# Patient Record
Sex: Female | Born: 1990 | Race: Black or African American | Hispanic: No | Marital: Single | Smoking: Current some day smoker
Health system: Southern US, Community
[De-identification: ages and names within clinical notes are randomized; demographics above are authoritative.]

## PROBLEM LIST (undated history)

## (undated) ENCOUNTER — Inpatient Hospital Stay (HOSPITAL_COMMUNITY): Payer: Self-pay

## (undated) DIAGNOSIS — J45909 Unspecified asthma, uncomplicated: Secondary | ICD-10-CM

## (undated) DIAGNOSIS — F329 Major depressive disorder, single episode, unspecified: Secondary | ICD-10-CM

## (undated) DIAGNOSIS — R519 Headache, unspecified: Secondary | ICD-10-CM

## (undated) DIAGNOSIS — F419 Anxiety disorder, unspecified: Secondary | ICD-10-CM

## (undated) DIAGNOSIS — O24419 Gestational diabetes mellitus in pregnancy, unspecified control: Secondary | ICD-10-CM

## (undated) DIAGNOSIS — F32A Depression, unspecified: Secondary | ICD-10-CM

## (undated) HISTORY — DX: Depression, unspecified: F32.A

## (undated) HISTORY — DX: Major depressive disorder, single episode, unspecified: F32.9

## (undated) HISTORY — DX: Anxiety disorder, unspecified: F41.9

## (undated) HISTORY — DX: Headache, unspecified: R51.9

---
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
## 2014-05-23 ENCOUNTER — Ambulatory Visit (INDEPENDENT_AMBULATORY_CARE_PROVIDER_SITE_OTHER): Payer: BC Managed Care – PPO | Admitting: Family Medicine

## 2014-05-23 VITALS — BP 137/77 | HR 81 | Temp 99.0°F | Resp 20 | Ht 66.75 in | Wt 219.2 lb

## 2014-05-23 DIAGNOSIS — J209 Acute bronchitis, unspecified: Secondary | ICD-10-CM

## 2014-05-23 DIAGNOSIS — J029 Acute pharyngitis, unspecified: Secondary | ICD-10-CM

## 2014-05-23 DIAGNOSIS — R05 Cough: Secondary | ICD-10-CM

## 2014-05-23 DIAGNOSIS — R059 Cough, unspecified: Secondary | ICD-10-CM

## 2014-05-23 MED ORDER — AZITHROMYCIN 250 MG PO TABS: ORAL_TABLET | ORAL | Status: DC

## 2014-05-23 MED ORDER — ALBUTEROL SULFATE HFA 108 (90 BASE) MCG/ACT IN AERS: 2.0000 | INHALATION_SPRAY | Freq: Four times a day (QID) | RESPIRATORY_TRACT | Status: DC | PRN

## 2014-05-23 MED ORDER — HYDROCODONE-HOMATROPINE 5-1.5 MG/5ML PO SYRP: 5.0000 mL | ORAL_SOLUTION | Freq: Three times a day (TID) | ORAL | Status: DC | PRN

## 2014-05-23 NOTE — Progress Notes (Signed)
° °  Subjective:    Patient ID: Julia Santiago, female    DOB: 03/19/1991, 23 y.o.   MRN: [id number]  HPI Chief Complaint  Patient presents with   Cough    brownish sputum, body aches x 3 days   This chart was scribed for Elvina SidleKurt Lauenstein, MD by Andrew Auaven Small, ED Scribe. This patient was seen in room 8 and the patient's care was started at 3:20 PM.  HPI Comments: Julia Santiago is a 23 y.o. female who presents to the [facility] complaining of productive cough onset 3 days ago. Pt reports HA's, congestion, postnasal drip, and body aches for the past 3 days. Pt reports she symptoms wake her up during the night due to choking on the mucous in her throat and constantly coughing. Pt reports hx of asthma when she was younger but denies having an inhale. Pt is allergic to penicillin.   Pt works at [employer].   Past Medical History  Diagnosis Date   Arthritis    Depression    Allergies  Allergen Reactions   Penicillins     As a child   Prior to Admission medications   Not on File   Review of Systems  HENT: Positive for congestion and postnasal drip.   Respiratory: Positive for cough.    Objective:   Physical Exam  Constitutional: She is oriented to person, place, and time. She appears well-developed and well-nourished. No distress.  HENT:  Head: Normocephalic and atraumatic.  Right Ear: External ear normal.  Left Ear: External ear normal.  Mouth/Throat: Posterior oropharyngeal erythema present. No oropharyngeal exudate or posterior oropharyngeal edema.  Eyes: Conjunctivae and EOM are normal.  Neck: Neck supple.  Slight fullness over the thyroid  Cardiovascular: Normal rate.   Pulmonary/Chest: Effort normal. She has wheezes ( expiratory).  Musculoskeletal: Normal range of motion.  Neurological: She is alert and oriented to person, place, and time.  Skin: Skin is warm and dry.  Psychiatric: She has a normal mood and affect. Her behavior is normal.    Nursing note and vitals reviewed.  Assessment & Plan:    1. Cough   2. Acute bronchitis, unspecified organism   3. Acute pharyngitis, unspecified pharyngitis type    Meds ordered this encounter  Medications   HYDROcodone-homatropine (HYCODAN) 5-1.5 MG/5ML syrup    Sig: Take 5 mLs by mouth every 8 (eight) hours as needed for cough.    Dispense:  120 mL    Refill:  0   azithromycin (ZITHROMAX Z-PAK) 250 MG tablet    Sig: Take as directed on pack    Dispense:  6 tablet    Refill:  0   albuterol (PROVENTIL HFA;VENTOLIN HFA) 108 (90 BASE) MCG/ACT inhaler    Sig: Inhale 2 puffs into the lungs every 6 (six) hours as needed for wheezing or shortness of breath.    Dispense:  1 Inhaler    Refill:  0   Elvina SidleKurt Lauenstein, MD

## 2014-05-23 NOTE — Patient Instructions (Signed)

## 2014-06-10 ENCOUNTER — Inpatient Hospital Stay (HOSPITAL_COMMUNITY)
Admission: AD | Admit: 2014-06-10 | Discharge: 2014-06-10 | Disposition: A | Discharge status: Home / Self Care | Payer: BC Managed Care – PPO | Source: Ambulatory Visit | Attending: Obstetrics | Admitting: Obstetrics

## 2014-06-10 ENCOUNTER — Inpatient Hospital Stay (HOSPITAL_COMMUNITY): Payer: BC Managed Care – PPO

## 2014-06-10 ENCOUNTER — Encounter (HOSPITAL_COMMUNITY): Payer: Self-pay | Admitting: *Deleted

## 2014-06-10 DIAGNOSIS — R109 Unspecified abdominal pain: Secondary | ICD-10-CM | POA: Diagnosis present

## 2014-06-10 DIAGNOSIS — O26899 Other specified pregnancy related conditions, unspecified trimester: Secondary | ICD-10-CM

## 2014-06-10 DIAGNOSIS — R51 Headache: Secondary | ICD-10-CM | POA: Diagnosis not present

## 2014-06-10 DIAGNOSIS — Z3A08 8 weeks gestation of pregnancy: Secondary | ICD-10-CM | POA: Insufficient documentation

## 2014-06-10 DIAGNOSIS — Z87891 Personal history of nicotine dependence: Secondary | ICD-10-CM | POA: Insufficient documentation

## 2014-06-10 DIAGNOSIS — O9989 Other specified diseases and conditions complicating pregnancy, childbirth and the puerperium: Secondary | ICD-10-CM | POA: Insufficient documentation

## 2014-06-10 DIAGNOSIS — O26891 Other specified pregnancy related conditions, first trimester: Secondary | ICD-10-CM

## 2014-06-10 DIAGNOSIS — R12 Heartburn: Secondary | ICD-10-CM | POA: Diagnosis not present

## 2014-06-10 HISTORY — DX: Unspecified asthma, uncomplicated: J45.909

## 2014-06-10 LAB — URINALYSIS, ROUTINE W REFLEX MICROSCOPIC
Bilirubin Urine: NEGATIVE
Glucose, UA: NEGATIVE mg/dL
HGB URINE DIPSTICK: NEGATIVE
Ketones, ur: NEGATIVE mg/dL
Leukocytes, UA: NEGATIVE
Nitrite: NEGATIVE
Protein, ur: NEGATIVE mg/dL
Specific Gravity, Urine: 1.02 (ref 1.005–1.030)
UROBILINOGEN UA: 0.2 mg/dL (ref 0.0–1.0)
pH: 8.5 — ABNORMAL HIGH (ref 5.0–8.0)

## 2014-06-10 LAB — CBC
HCT: 41 % (ref 36.0–46.0)
Hemoglobin: 13.7 g/dL (ref 12.0–15.0)
MCH: 30 pg (ref 26.0–34.0)
MCHC: 33.4 g/dL (ref 30.0–36.0)
MCV: 89.7 fL (ref 78.0–100.0)
PLATELETS: 408 10*3/uL — AB (ref 150–400)
RBC: 4.57 MIL/uL (ref 3.87–5.11)
RDW: 12.5 % (ref 11.5–15.5)
WBC: 11.8 10*3/uL — ABNORMAL HIGH (ref 4.0–10.5)

## 2014-06-10 LAB — AMYLASE: Amylase: 41 U/L (ref 0–105)

## 2014-06-10 LAB — LIPASE, BLOOD: Lipase: 20 U/L (ref 11–59)

## 2014-06-10 LAB — HCG, QUANTITATIVE, PREGNANCY: hCG, Beta Chain, Quant, S: 61804 m[IU]/mL — ABNORMAL HIGH (ref <5)

## 2014-06-10 MED ORDER — PROMETHAZINE HCL 25 MG PO TABS
25.0000 mg | ORAL_TABLET | Freq: Once | ORAL | Status: AC
  Administered 2014-06-10: 25 mg via ORAL
  Filled 2014-06-10: qty 1

## 2014-06-10 MED ORDER — RANITIDINE HCL 150 MG PO TABS: 150.0000 mg | ORAL_TABLET | Freq: Two times a day (BID) | ORAL | Status: DC

## 2014-06-10 MED ORDER — PROMETHAZINE HCL 25 MG PO TABS: 12.5000 mg | ORAL_TABLET | Freq: Four times a day (QID) | ORAL | Status: DC | PRN

## 2014-06-10 MED ORDER — GI COCKTAIL ~~LOC~~
30.0000 mL | Freq: Once | ORAL | Status: AC
  Administered 2014-06-10: 30 mL via ORAL
  Filled 2014-06-10: qty 30

## 2014-06-10 NOTE — MAU Note (Signed)
Been having stomach pains since yesterday; mostly upper abd, sometimes in lower abd.  Been having, nausea, and diarrhea. No one else at home is sick or has diarrhea. No fever

## 2014-06-10 NOTE — MAU Provider Note (Signed)
Chief Complaint: Abdominal Pain; Nausea; and Diarrhea   None    SUBJECTIVE HPI: Julia Santiago is a 23 y.o. G1P0 at 8154w1d by LMP who presents to maternity admissions reporting abdominal pain, h/a, nausea, and diarrhea x 2 days.  She reports her abdominal pain is mostly upper abdomen but she does have lower abdominal cramping as well.  She denies recent sick contacts.  She was seen 2 weeks ago in Dr Elsie StainMarshall's office and had U/S showing "only the sac and no baby".   She also had pelvic exam with cultures and prenatal labs at this visit.  She denies vaginal bleeding, vaginal itching/burning, urinary symptoms, dizziness, vomiting, or fever/chills.     Past Medical History  Diagnosis Date  . Depression   . Asthma    Past Surgical History  Procedure Laterality Date  . No past surgeries     History   Social History  . Marital Status: Single    Spouse Name: N/A    Number of Children: N/A  . Years of Education: N/A   Occupational History  . Not on file.   Social History Main Topics  . Smoking status: Former Smoker    Quit date: 05/11/2014  . Smokeless tobacco: Never Used  . Alcohol Use: 0.0 oz/week    0 Not specified per week     Comment: social use  . Drug Use: No  . Sexual Activity: Not on file   Other Topics Concern  . Not on file   Social History Narrative   No current facility-administered medications on file prior to encounter.   Current Outpatient Prescriptions on File Prior to Encounter  Medication Sig Dispense Refill  . albuterol (PROVENTIL HFA;VENTOLIN HFA) 108 (90 BASE) MCG/ACT inhaler Inhale 2 puffs into the lungs every 6 (six) hours as needed for wheezing or shortness of breath. 1 Inhaler 0  . azithromycin (ZITHROMAX Z-PAK) 250 MG tablet Take as directed on pack 6 tablet 0  . HYDROcodone-homatropine (HYCODAN) 5-1.5 MG/5ML syrup Take 5 mLs by mouth every 8 (eight) hours as needed for cough. 120 mL 0   Allergies  Allergen Reactions  . Penicillins     As a  child-reaction unknown    ROS: Pertinent items in HPI  OBJECTIVE Blood pressure 122/62, pulse 80, temperature 98.4 F (36.9 C), temperature source Oral, resp. rate 18, height 5\' 6"  (1.676 m), weight 97.977 kg (216 lb), last menstrual period 04/13/2014. GENERAL: Well-developed, well-nourished female in no acute distress.  HEENT: Normocephalic HEART: normal rate RESP: normal effort ABDOMEN: Soft, non-tender EXTREMITIES: Nontender, no edema NEURO: Alert and oriented Pelvic exam: deferred--done in office at recent visit  LAB RESULTS Results for orders placed or performed during the hospital encounter of 06/10/14 (from the past 24 hour(s))  Urinalysis, Routine w reflex microscopic     Status: Abnormal   Collection Time: 06/10/14 11:27 AM  Result Value Ref Range   Color, Urine YELLOW YELLOW   APPearance CLEAR CLEAR   Specific Gravity, Urine 1.020 1.005 - 1.030   pH 8.5 (H) 5.0 - 8.0   Glucose, UA NEGATIVE NEGATIVE mg/dL   Hgb urine dipstick NEGATIVE NEGATIVE   Bilirubin Urine NEGATIVE NEGATIVE   Ketones, ur NEGATIVE NEGATIVE mg/dL   Protein, ur NEGATIVE NEGATIVE mg/dL   Urobilinogen, UA 0.2 0.0 - 1.0 mg/dL   Nitrite NEGATIVE NEGATIVE   Leukocytes, UA NEGATIVE NEGATIVE  hCG, quantitative, pregnancy     Status: Abnormal   Collection Time: 06/10/14  1:55 PM  Result Value  Ref Range   hCG, Beta Chain, Quant, S [address] (H) <5 mIU/mL  CBC     Status: Abnormal   Collection Time: 06/10/14  1:55 PM  Result Value Ref Range   WBC 11.8 (H) 4.0 - 10.5 K/uL   RBC 4.57 3.87 - 5.11 MIL/uL   Hemoglobin 13.7 12.0 - 15.0 g/dL   HCT 60.4 54.0 - 98.1 %   MCV 89.7 78.0 - 100.0 fL   MCH 30.0 26.0 - 34.0 pg   MCHC 33.4 30.0 - 36.0 g/dL   RDW 19.1 47.8 - 29.5 %   Platelets 408 (H) 150 - 400 K/uL  Amylase     Status: None   Collection Time: 06/10/14  1:55 PM  Result Value Ref Range   Amylase 41 0 - 105 U/L  Lipase, blood     Status: None   Collection Time: 06/10/14  1:55 PM  Result Value Ref  Range   Lipase 20 11 - 59 U/L    IMAGING US Ob Comp Less 14 Wks  06/10/2014   CLINICAL DATA:  Abdominal pain for 2 days.  EXAM: OBSTETRIC <14 WK [place] AND TRANSVAGINAL OB US  TECHNIQUE: Both transabdominal and transvaginal ultrasound examinations were performed for complete evaluation of the gestation as well as the maternal uterus, adnexal regions, and pelvic cul-de-sac. Transvaginal technique was performed to assess early pregnancy.  COMPARISON:  None.  FINDINGS: Intrauterine gestational sac: Visualized/normal in shape.  Yolk sac:  Visualized  Embryo:  Visualized  Cardiac Activity: Visualized  Heart Rate:  135 bpm  MSD:    mm    w     d  CRL:   14  mm   7 w 5 d                  [place] EDC: 01/21/2014  Maternal uterus/adnexae: No subchorionic hemorrhage. Right ovary unremarkable. Left ovary not visualized. No adnexal masses or free fluid  IMPRESSION: Seven week 5 day intrauterine pregnancy. Fetal heart rate 135 beats per min. No acute maternal findings.   Electronically Signed   By: Charlett Nose M.D.   On: 06/10/2014 14:23   US Ob Transvaginal  06/10/2014   CLINICAL DATA:  Abdominal pain for 2 days.  EXAM: OBSTETRIC <14 WK [place] AND TRANSVAGINAL OB US  TECHNIQUE: Both transabdominal and transvaginal ultrasound examinations were performed for complete evaluation of the gestation as well as the maternal uterus, adnexal regions, and pelvic cul-de-sac. Transvaginal technique was performed to assess early pregnancy.  COMPARISON:  None.  FINDINGS: Intrauterine gestational sac: Visualized/normal in shape.  Yolk sac:  Visualized  Embryo:  Visualized  Cardiac Activity: Visualized  Heart Rate:  135 bpm  MSD:    mm    w     d  CRL:   14  mm   7 w 5 d                  [place] EDC: 01/21/2014  Maternal uterus/adnexae: No subchorionic hemorrhage. Right ovary unremarkable. Left ovary not visualized. No adnexal masses or free fluid  IMPRESSION: Seven week 5 day intrauterine pregnancy. Fetal heart rate 135 beats per min. No acute  maternal findings.   Electronically Signed   By: Charlett Nose M.D.   On: 06/10/2014 14:23    ASSESSMENT 1. Heartburn during pregnancy in first trimester   2. Abdominal pain affecting pregnancy   3. Headache in pregnancy, antepartum, first trimester     PLAN Discharge home Phenergan 12.5-25  mg PO Q 6 hours Zantac 150 mg BID Increase PO fluids.  Tylenol PRN for h/a. F/U with Dr Gaynell FaceMarshall as scheduled Return to MAU as needed    Medication List    TAKE these medications        albuterol 108 (90 BASE) MCG/ACT inhaler  Commonly known as:  PROVENTIL HFA;VENTOLIN HFA  Inhale 2 puffs into the lungs every 6 (six) hours as needed for wheezing or shortness of breath.     azithromycin 250 MG tablet  Commonly known as:  ZITHROMAX Z-PAK  Take as directed on pack     HYDROcodone-homatropine 5-1.5 MG/5ML syrup  Commonly known as:  HYCODAN  Take 5 mLs by mouth every 8 (eight) hours as needed for cough.     promethazine 25 MG tablet  Commonly known as:  PHENERGAN  Take 0.5-1 tablets (12.5-25 mg total) by mouth every 6 (six) hours as needed.     ranitidine 150 MG tablet  Commonly known as:  ZANTAC  Take 1 tablet (150 mg total) by mouth 2 (two) times daily.       Follow-up Information    Follow up with Kathreen CosierMARSHALL,BERNARD A, MD.   Specialty:  Obstetrics and Gynecology   Why:  As scheduled   Contact information:   [address] [place] [address] [phone]       [facility] with [facility].   Why:  As needed for emergencies   Contact information:   [address] [id number] [place]c [place] [id number] [phone]      Sharen CounterLisa Leftwich-Kirby Certified Nurse-Midwife 06/10/2014  4:54 PM

## 2014-06-29 ENCOUNTER — Other Ambulatory Visit (HOSPITAL_COMMUNITY): Payer: Self-pay | Admitting: Obstetrics

## 2014-12-06 ENCOUNTER — Inpatient Hospital Stay (HOSPITAL_COMMUNITY)
Admission: AD | Admit: 2014-12-06 | Discharge: 2014-12-06 | Disposition: A | Discharge status: Home / Self Care | Payer: BLUE CROSS/BLUE SHIELD | Source: Ambulatory Visit | Attending: Obstetrics | Admitting: Obstetrics

## 2014-12-06 ENCOUNTER — Inpatient Hospital Stay (HOSPITAL_COMMUNITY): Payer: BLUE CROSS/BLUE SHIELD

## 2014-12-06 ENCOUNTER — Encounter (HOSPITAL_COMMUNITY): Payer: Self-pay

## 2014-12-06 DIAGNOSIS — Z87891 Personal history of nicotine dependence: Secondary | ICD-10-CM | POA: Diagnosis not present

## 2014-12-06 DIAGNOSIS — R1012 Left upper quadrant pain: Secondary | ICD-10-CM | POA: Diagnosis not present

## 2014-12-06 DIAGNOSIS — O9989 Other specified diseases and conditions complicating pregnancy, childbirth and the puerperium: Secondary | ICD-10-CM | POA: Diagnosis not present

## 2014-12-06 DIAGNOSIS — Z3A33 33 weeks gestation of pregnancy: Secondary | ICD-10-CM | POA: Diagnosis not present

## 2014-12-06 DIAGNOSIS — Z880 Allergy status to penicillin: Secondary | ICD-10-CM | POA: Insufficient documentation

## 2014-12-06 DIAGNOSIS — O99513 Diseases of the respiratory system complicating pregnancy, third trimester: Secondary | ICD-10-CM | POA: Insufficient documentation

## 2014-12-06 DIAGNOSIS — N939 Abnormal uterine and vaginal bleeding, unspecified: Secondary | ICD-10-CM | POA: Diagnosis present

## 2014-12-06 DIAGNOSIS — J45909 Unspecified asthma, uncomplicated: Secondary | ICD-10-CM | POA: Diagnosis not present

## 2014-12-06 DIAGNOSIS — Z79899 Other long term (current) drug therapy: Secondary | ICD-10-CM | POA: Insufficient documentation

## 2014-12-06 DIAGNOSIS — O26853 Spotting complicating pregnancy, third trimester: Secondary | ICD-10-CM | POA: Diagnosis not present

## 2014-12-06 LAB — URINALYSIS, ROUTINE W REFLEX MICROSCOPIC
Bilirubin Urine: NEGATIVE
GLUCOSE, UA: NEGATIVE mg/dL
Hgb urine dipstick: NEGATIVE
Ketones, ur: 15 mg/dL — AB
Nitrite: NEGATIVE
PH: 7 (ref 5.0–8.0)
PROTEIN: NEGATIVE mg/dL
Specific Gravity, Urine: 1.015 (ref 1.005–1.030)
UROBILINOGEN UA: 1 mg/dL (ref 0.0–1.0)

## 2014-12-06 LAB — WET PREP, GENITAL
Trich, Wet Prep: NONE SEEN
YEAST WET PREP: NONE SEEN

## 2014-12-06 LAB — URINE MICROSCOPIC-ADD ON

## 2014-12-06 NOTE — Discharge Instructions (Signed)
Third Trimester of Pregnancy The third trimester is from week 29 through week 42, months 7 through 9. The third trimester is a time when the fetus is growing rapidly. At the end of the ninth month, the fetus is about 20 inches in length and weighs 6-10 pounds.  BODY CHANGES Your body goes through many changes during pregnancy. The changes vary from woman to woman.   Your weight will continue to increase. You can expect to gain 25-35 pounds (11-16 kg) by the end of the pregnancy.  You may begin to get stretch marks on your hips, abdomen, and breasts.  You may urinate more often because the fetus is moving lower into your pelvis and pressing on your bladder.  You may develop or continue to have heartburn as a result of your pregnancy.  You may develop constipation because certain hormones are causing the muscles that push waste through your intestines to slow down.  You may develop hemorrhoids or swollen, bulging veins (varicose veins).  You may have pelvic pain because of the weight gain and pregnancy hormones relaxing your joints between the bones in your pelvis. Backaches may result from overexertion of the muscles supporting your posture.  You may have changes in your hair. These can include thickening of your hair, rapid growth, and changes in texture. Some women also have hair loss during or after pregnancy, or hair that feels dry or thin. Your hair will most likely return to normal after your baby is born.  Your breasts will continue to grow and be tender. A yellow discharge may leak from your breasts called colostrum.  Your belly button may stick out.  You may feel short of breath because of your expanding uterus.  You may notice the fetus "dropping," or moving lower in your abdomen.  You may have a bloody mucus discharge. This usually occurs a few days to a week before labor begins.  Your cervix becomes thin and soft (effaced) near your due date. WHAT TO EXPECT AT YOUR PRENATAL  EXAMS  You will have prenatal exams every 2 weeks until week 36. Then, you will have weekly prenatal exams. During a routine prenatal visit:  You will be weighed to make sure you and the fetus are growing normally.  Your blood pressure is taken.  Your abdomen will be measured to track your baby's growth.  The fetal heartbeat will be listened to.  Any test results from the previous visit will be discussed.  You may have a cervical check near your due date to see if you have effaced. At around 36 weeks, your caregiver will check your cervix. At the same time, your caregiver will also perform a test on the secretions of the vaginal tissue. This test is to determine if a type of bacteria, Group B streptococcus, is present. Your caregiver will explain this further. Your caregiver may ask you:  What your birth plan is.  How you are feeling.  If you are feeling the baby move.  If you have had any abnormal symptoms, such as leaking fluid, bleeding, severe headaches, or abdominal cramping.  If you have any questions. Other tests or screenings that may be performed during your third trimester include:  Blood tests that check for low iron levels (anemia).  Fetal testing to check the health, activity level, and growth of the fetus. Testing is done if you have certain medical conditions or if there are problems during the pregnancy. FALSE LABOR You may feel small, irregular contractions that   eventually go away. These are called Braxton Hicks contractions, or false labor. Contractions may last for hours, days, or even weeks before true labor sets in. If contractions come at regular intervals, intensify, or become painful, it is best to be seen by your caregiver.  SIGNS OF LABOR   Menstrual-like cramps.  Contractions that are 5 minutes apart or less.  Contractions that start on the top of the uterus and spread down to the lower abdomen and back.  A sense of increased pelvic pressure or back  pain.  A watery or bloody mucus discharge that comes from the vagina. If you have any of these signs before the 37th week of pregnancy, call your caregiver right away. You need to go to the hospital to get checked immediately. HOME CARE INSTRUCTIONS   Avoid all smoking, herbs, alcohol, and unprescribed drugs. These chemicals affect the formation and growth of the baby.  Follow your caregiver's instructions regarding medicine use. There are medicines that are either safe or unsafe to take during pregnancy.  Exercise only as directed by your caregiver. Experiencing uterine cramps is a good sign to stop exercising.  Continue to eat regular, healthy meals.  Wear a good support bra for breast tenderness.  Do not use hot tubs, steam rooms, or saunas.  Wear your seat belt at all times when driving.  Avoid raw meat, uncooked cheese, cat litter boxes, and soil used by cats. These carry germs that can cause birth defects in the baby.  Take your prenatal vitamins.  Try taking a stool softener (if your caregiver approves) if you develop constipation. Eat more high-fiber foods, such as fresh vegetables or fruit and whole grains. Drink plenty of fluids to keep your urine clear or pale yellow.  Take warm sitz baths to soothe any pain or discomfort caused by hemorrhoids. Use hemorrhoid cream if your caregiver approves.  If you develop varicose veins, wear support hose. Elevate your feet for 15 minutes, 3-4 times a day. Limit salt in your diet.  Avoid heavy lifting, wear low heal shoes, and practice good posture.  Rest a lot with your legs elevated if you have leg cramps or low back pain.  Visit your dentist if you have not gone during your pregnancy. Use a soft toothbrush to brush your teeth and be gentle when you floss.  A sexual relationship may be continued unless your caregiver directs you otherwise.  Do not travel far distances unless it is absolutely necessary and only with the approval  of your caregiver.  Take prenatal classes to understand, practice, and ask questions about the labor and delivery.  Make a trial run to the hospital.  Pack your hospital bag.  Prepare the baby's nursery.  Continue to go to all your prenatal visits as directed by your caregiver. SEEK MEDICAL CARE IF:  You are unsure if you are in labor or if your water has broken.  You have dizziness.  You have mild pelvic cramps, pelvic pressure, or nagging pain in your abdominal area.  You have persistent nausea, vomiting, or diarrhea.  You have a bad smelling vaginal discharge.  You have pain with urination. SEEK IMMEDIATE MEDICAL CARE IF:   You have a fever.  You are leaking fluid from your vagina.  You have spotting or bleeding from your vagina.  You have severe abdominal cramping or pain.  You have rapid weight loss or gain.  You have shortness of breath with chest pain.  You notice sudden or extreme swelling   of your face, hands, ankles, feet, or legs.  You have not felt your baby move in over an hour.  You have severe headaches that do not go away with medicine.  You have vision changes. Document Released: 06/19/2001 Document Revised: 06/30/2013 Document Reviewed: 08/26/2012 ExitCare Patient Information 2015 [employer]. This information is not intended to replace advice given to you by your health care provider. Make sure you discuss any questions you have with your health care provider.  

## 2014-12-06 NOTE — MAU Provider Note (Signed)
History     CSN: [id number]  Arrival date and time: 12/06/14 0865   First Provider Initiated Contact with Patient 12/06/14 [phone]      Chief Complaint  Patient presents with  . Vaginal Bleeding  . Abdominal Cramping   HPI Comments: Jamae D Gens is a 24 y.o. G1P0 a [redacted]w[redacted]d who presents today with spotting. She states that when she woke up at 0500 she had one episode of spotting when she wiped. She states that it only happened once, and she hasn't had any since. She denies any contractions or LOF. She reports that she has a placenta previa, and she is planning to have a repeat US within the next week to evaluate for the placenta. She does have some LUQ pain as well. She rates her pain 4/10 at this time.   Vaginal Bleeding The patient's primary symptoms include vaginal bleeding. This is a new problem. The current episode started today. Episode frequency: once while wiping  The problem has been resolved. The pain is mild. The problem affects the left side. She is pregnant. Associated symptoms include abdominal pain. Pertinent negatives include no constipation, diarrhea, dysuria, fever, frequency, nausea, urgency or vomiting. The vaginal discharge was bloody. The vaginal bleeding is spotting. She has not been passing clots. She has not been passing tissue. Nothing aggravates the symptoms. She has tried nothing for the symptoms. She is not sexually active.     Past Medical History  Diagnosis Date  . Depression   . Asthma     Past Surgical History  Procedure Laterality Date  . No past surgeries      Family History  Problem Relation Age of Onset  . Stroke Maternal Grandmother   . Heart disease Maternal Grandfather     History  Substance Use Topics  . Smoking status: Former Smoker    Quit date: 05/11/2014  . Smokeless tobacco: Never Used  . Alcohol Use: 0.0 oz/week    0 Standard drinks or equivalent per week     Comment: social use    Allergies:  Allergies  Allergen  Reactions  . Penicillins     As a child-reaction unknown    Prescriptions prior to admission  Medication Sig Dispense Refill Last Dose  . albuterol (PROVENTIL HFA;VENTOLIN HFA) 108 (90 BASE) MCG/ACT inhaler Inhale 2 puffs into the lungs every 6 (six) hours as needed for wheezing or shortness of breath. 1 Inhaler 0 Past Week at Unknown time  . azithromycin (ZITHROMAX Z-PAK) 250 MG tablet Take as directed on pack 6 tablet 0 Past Month at Unknown time  . HYDROcodone-homatropine (HYCODAN) 5-1.5 MG/5ML syrup Take 5 mLs by mouth every 8 (eight) hours as needed for cough. 120 mL 0 Past Month at Unknown time  . promethazine (PHENERGAN) 25 MG tablet Take 0.5-1 tablets (12.5-25 mg total) by mouth every 6 (six) hours as needed. 30 tablet 2   . ranitidine (ZANTAC) 150 MG tablet Take 1 tablet (150 mg total) by mouth 2 (two) times daily. 60 tablet 5     Review of Systems  Constitutional: Negative for fever.  Gastrointestinal: Positive for abdominal pain. Negative for nausea, vomiting, diarrhea and constipation.  Genitourinary: Positive for vaginal bleeding. Negative for dysuria, urgency and frequency.   Physical Exam   Height  (1.702 m), weight 90.357 kg (199 lb 3.2 oz), last menstrual period 04/13/2014.  Physical Exam  Nursing note and vitals reviewed. Constitutional: She is oriented to person, place, and time. She appears well-developed and well-nourished.  No distress.  Cardiovascular: Normal rate.   Respiratory: Effort normal.  GI: Soft. There is no tenderness. There is no rebound.  Genitourinary:   External: no lesion Vagina: small amount of white discharge. No blood seen  Cervix: pink, smooth, visually closed. Will wait to check until after US to verify placenta location  Uterus: AGA    Neurological: She is alert and oriented to person, place, and time.  Skin: Skin is warm and dry.  Psychiatric: She has a normal mood and affect.   US: No placenta previa or abruption  identified Breech AFI 16.81  Cervix: closed/thick/high   Results for orders placed or performed during the hospital encounter of 12/06/14 (from the past 24 hour(s))  Urinalysis, Routine w reflex microscopic ([facility])     Status: Abnormal   Collection Time: 12/06/14  6:30 AM  Result Value Ref Range   Color, Urine YELLOW YELLOW   APPearance CLEAR CLEAR   Specific Gravity, Urine 1.015 1.005 - 1.030   pH 7.0 5.0 - 8.0   Glucose, UA NEGATIVE NEGATIVE mg/dL   Hgb urine dipstick NEGATIVE NEGATIVE   Bilirubin Urine NEGATIVE NEGATIVE   Ketones, ur 15 (A) NEGATIVE mg/dL   Protein, ur NEGATIVE NEGATIVE mg/dL   Urobilinogen, UA 1.0 0.0 - 1.0 mg/dL   Nitrite NEGATIVE NEGATIVE   Leukocytes, UA TRACE (A) NEGATIVE  Urine microscopic-add on     Status: None   Collection Time: 12/06/14  6:30 AM  Result Value Ref Range   Squamous Epithelial / LPF RARE RARE   WBC, UA 0-2 <3 WBC/hpf  Wet prep, genital     Status: Abnormal   Collection Time: 12/06/14  6:50 AM  Result Value Ref Range   Yeast Wet Prep HPF POC NONE SEEN NONE SEEN   Trich, Wet Prep NONE SEEN NONE SEEN   Clue Cells Wet Prep HPF POC FEW (A) NONE SEEN   WBC, Wet Prep HPF POC FEW (A) NONE SEEN    MAU Course  Procedures  MDM   Assessment and Plan   1. Spotting affecting pregnancy in third trimester    DC home 3rd Trimester precautions  Bleeding precautions PTL precautions  Fetal kick counts RX: none  Return to MAU as needed FU with OB as planned  Follow-up Information    Follow up with Kathreen CosierMARSHALL,BERNARD A, MD.   Specialty:  Obstetrics and Gynecology   Why:  As scheduled   Contact information:   [address] [place] [address] [phone]         Tawnya CrookHogan, Ameris Akamine Donovan 12/06/2014, 6:48 AM

## 2014-12-06 NOTE — MAU Note (Signed)
Woke up at 5 am with lower abdominal cramping and one episode of bright red bleeding on toilet paper. States abdominal pain stopped on arrival to MAU. No fetal movement since waking up at 5 am.

## 2014-12-07 LAB — GC/CHLAMYDIA PROBE AMP (~~LOC~~) NOT AT ARMC
Chlamydia: NEGATIVE
NEISSERIA GONORRHEA: NEGATIVE

## 2015-01-27 ENCOUNTER — Other Ambulatory Visit: Payer: Self-pay | Admitting: *Deleted

## 2015-01-28 ENCOUNTER — Encounter (HOSPITAL_COMMUNITY): Payer: Self-pay | Admitting: *Deleted

## 2015-01-28 ENCOUNTER — Inpatient Hospital Stay (HOSPITAL_COMMUNITY)
Admission: AD | Admit: 2015-01-28 | Discharge: 2015-01-28 | Disposition: A | Discharge status: Home / Self Care | Payer: BLUE CROSS/BLUE SHIELD | Source: Ambulatory Visit | Attending: Obstetrics | Admitting: Obstetrics

## 2015-01-28 DIAGNOSIS — O321XX0 Maternal care for breech presentation, not applicable or unspecified: Principal | ICD-10-CM | POA: Diagnosis present

## 2015-01-28 DIAGNOSIS — Z87891 Personal history of nicotine dependence: Secondary | ICD-10-CM

## 2015-01-28 DIAGNOSIS — Z3A41 41 weeks gestation of pregnancy: Secondary | ICD-10-CM | POA: Diagnosis present

## 2015-01-28 MED ORDER — GENTAMICIN SULFATE 40 MG/ML IJ SOLN
Freq: Once | INTRAVENOUS | Status: DC
  Filled 2015-01-28: qty 9.25

## 2015-01-28 NOTE — MAU Note (Signed)
Notified Dr. Clearance Coots patient here for NST scheduled C/S for tomorrow for postdates and breech presentation, reactive NST  Received d/c order.

## 2015-01-28 NOTE — Discharge Instructions (Signed)
Fetal Movement Counts °Patient Name: __________________________________________________ Patient Due Date: ____________________ °Performing a fetal movement count is highly recommended in high-risk pregnancies, but it is good for every pregnant woman to do. Your health care provider may ask you to start counting fetal movements at 28 weeks of the pregnancy. Fetal movements often increase: °· After eating a full meal. °· After physical activity. °· After eating or drinking something sweet or cold. °· At rest. °Pay attention to when you feel the baby is most active. This will help you notice a pattern of your baby's sleep and wake cycles and what factors contribute to an increase in fetal movement. It is important to perform a fetal movement count at the same time each day when your baby is normally most active.  °HOW TO COUNT FETAL MOVEMENTS °1. Find a quiet and comfortable area to sit or lie down on your left side. Lying on your left side provides the best blood and oxygen circulation to your baby. °2. Write down the day and time on a sheet of paper or in a journal. °3. Start counting kicks, flutters, swishes, rolls, or jabs in a 2-hour period. You should feel at least 10 movements within 2 hours. °4. If you do not feel 10 movements in 2 hours, wait 2-3 hours and count again. Look for a change in the pattern or not enough counts in 2 hours. °SEEK MEDICAL CARE IF: °· You feel less than 10 counts in 2 hours, tried twice. °· There is no movement in over an hour. °· The pattern is changing or taking longer each day to reach 10 counts in 2 hours. °· You feel the baby is not moving as he or she usually does. °Date: ____________ Movements: ____________ Start time: ____________ Finish time: ____________  °Date: ____________ Movements: ____________ Start time: ____________ Finish time: ____________ °Date: ____________ Movements: ____________ Start time: ____________ Finish time: ____________ °Date: ____________ Movements:  ____________ Start time: ____________ Finish time: ____________ °Date: ____________ Movements: ____________ Start time: ____________ Finish time: ____________ °Date: ____________ Movements: ____________ Start time: ____________ Finish time: ____________ °Date: ____________ Movements: ____________ Start time: ____________ Finish time: ____________ °Date: ____________ Movements: ____________ Start time: ____________ Finish time: ____________  °Date: ____________ Movements: ____________ Start time: ____________ Finish time: ____________ °Date: ____________ Movements: ____________ Start time: ____________ Finish time: ____________ °Date: ____________ Movements: ____________ Start time: ____________ Finish time: ____________ °Date: ____________ Movements: ____________ Start time: ____________ Finish time: ____________ °Date: ____________ Movements: ____________ Start time: ____________ Finish time: ____________ °Date: ____________ Movements: ____________ Start time: ____________ Finish time: ____________ °Date: ____________ Movements: ____________ Start time: ____________ Finish time: ____________  °Date: ____________ Movements: ____________ Start time: ____________ Finish time: ____________ °Date: ____________ Movements: ____________ Start time: ____________ Finish time: ____________ °Date: ____________ Movements: ____________ Start time: ____________ Finish time: ____________ °Date: ____________ Movements: ____________ Start time: ____________ Finish time: ____________ °Date: ____________ Movements: ____________ Start time: ____________ Finish time: ____________ °Date: ____________ Movements: ____________ Start time: ____________ Finish time: ____________ °Date: ____________ Movements: ____________ Start time: ____________ Finish time: ____________  °Date: ____________ Movements: ____________ Start time: ____________ Finish time: ____________ °Date: ____________ Movements: ____________ Start time: ____________ Finish  time: ____________ °Date: ____________ Movements: ____________ Start time: ____________ Finish time: ____________ °Date: ____________ Movements: ____________ Start time: ____________ Finish time: ____________ °Date: ____________ Movements: ____________ Start time: ____________ Finish time: ____________ °Date: ____________ Movements: ____________ Start time: ____________ Finish time: ____________ °Date: ____________ Movements: ____________ Start time: ____________ Finish time: ____________  °Date: ____________ Movements: ____________ Start time: ____________ Finish   time: ____________ °Date: ____________ Movements: ____________ Start time: ____________ Finish time: ____________ °Date: ____________ Movements: ____________ Start time: ____________ Finish time: ____________ °Date: ____________ Movements: ____________ Start time: ____________ Finish time: ____________ °Date: ____________ Movements: ____________ Start time: ____________ Finish time: ____________ °Date: ____________ Movements: ____________ Start time: ____________ Finish time: ____________ °Date: ____________ Movements: ____________ Start time: ____________ Finish time: ____________  °Date: ____________ Movements: ____________ Start time: ____________ Finish time: ____________ °Date: ____________ Movements: ____________ Start time: ____________ Finish time: ____________ °Date: ____________ Movements: ____________ Start time: ____________ Finish time: ____________ °Date: ____________ Movements: ____________ Start time: ____________ Finish time: ____________ °Date: ____________ Movements: ____________ Start time: ____________ Finish time: ____________ °Date: ____________ Movements: ____________ Start time: ____________ Finish time: ____________ °Date: ____________ Movements: ____________ Start time: ____________ Finish time: ____________  °Date: ____________ Movements: ____________ Start time: ____________ Finish time: ____________ °Date: ____________  Movements: ____________ Start time: ____________ Finish time: ____________ °Date: ____________ Movements: ____________ Start time: ____________ Finish time: ____________ °Date: ____________ Movements: ____________ Start time: ____________ Finish time: ____________ °Date: ____________ Movements: ____________ Start time: ____________ Finish time: ____________ °Date: ____________ Movements: ____________ Start time: ____________ Finish time: ____________ °Date: ____________ Movements: ____________ Start time: ____________ Finish time: ____________  °Date: ____________ Movements: ____________ Start time: ____________ Finish time: ____________ °Date: ____________ Movements: ____________ Start time: ____________ Finish time: ____________ °Date: ____________ Movements: ____________ Start time: ____________ Finish time: ____________ °Date: ____________ Movements: ____________ Start time: ____________ Finish time: ____________ °Date: ____________ Movements: ____________ Start time: ____________ Finish time: ____________ °Date: ____________ Movements: ____________ Start time: ____________ Finish time: ____________ °Document Released: 07/25/2006 Document Revised: 11/09/2013 Document Reviewed: 04/21/2012 °[employer]® Patient Information ©2015 [employer]. This information is not intended to replace advice given to you by your health care provider. Make sure you discuss any questions you have with your health care provider. °Vaginal Delivery °During delivery, your health care provider will help you give birth to your baby. During a vaginal delivery, you will work to push the baby out of your vagina. However, before you can push your baby out, a few things need to happen. The opening of your uterus (cervix) has to soften, thin out, and open up (dilate) all the way to 10 cm. Also, your baby has to move down from the uterus into your vagina.  °SIGNS OF LABOR  °Your health care provider will first need to make sure you are in labor.  Signs of labor include:  °· Passing what is called the mucous plug before labor begins. This is a small amount of blood-stained mucus. °· Having regular, painful uterine contractions.   °· The time between contractions gets shorter.   °· The discomfort and pain gradually get more intense. °· Contraction pains get worse when walking and do not go away when resting.   °· Your cervix becomes thinner (effacement) and dilates. °BEFORE THE DELIVERY °Once you are in labor and admitted into the hospital or care center, your health care provider may do the following:  °5. Perform a complete physical exam. °6. Review any complications related to pregnancy or labor.  °7. Check your blood pressure, pulse, temperature, and heart rate (vital signs).   °8. Determine if, and when, the rupture of amniotic membranes occurred. °9. Do a vaginal exam (using a sterile glove and lubricant) to determine:   °1. The position (presentation) of the baby. Is the baby's head presenting first (vertex) in the birth canal (vagina), or are the feet or buttocks first (breech)?   °2. The level (station) of the baby's head within the birth canal.   °3.   The effacement and dilatation of the cervix.   °10. An electronic fetal monitor is usually placed on your abdomen when you first arrive. This is used to monitor your contractions and the baby's heart rate. °1. When the monitor is on your abdomen (external fetal monitor), it can only pick up the frequency and length of your contractions. It cannot tell the strength of your contractions. °2. If it becomes necessary for your health care provider to know exactly how strong your contractions are or to see exactly what the baby's heart rate is doing, an internal monitor may be inserted into your vagina and uterus. Your health care provider will discuss the benefits and risks of using an internal monitor and obtain your permission before inserting the device. °3. Continuous fetal monitoring may be needed if you  have an epidural, are receiving certain medicines (such as oxytocin), or have pregnancy or labor complications. °11. An IV access tube may be placed into a vein in your arm to deliver fluids and medicines if necessary. °THREE STAGES OF LABOR AND DELIVERY °Normal labor and delivery is divided into three stages. °First Stage °This stage starts when you begin to contract regularly and your cervix begins to efface and dilate. It ends when your cervix is completely open (fully dilated). The first stage is the longest stage of labor and can last from 3 hours to 15 hours.  °Several methods are available to help with labor pain. You and your health care provider will decide which option is best for you. Options include:  °· Opioid medicines. These are strong pain medicines that you can get through your IV tube or as a shot into your muscle. These medicines lessen pain but do not make it go away completely.  °· Epidural. A medicine is given through a thin tube that is inserted in your back. The medicine numbs the lower part of your body and prevents any pain in that area. °· Paracervical pain medicine. This is an injection of an anesthetic on each side of your cervix.   °· You may request natural childbirth, which does not involve the use of pain medicines or an epidural during labor and delivery. Instead, you will use other things, such as breathing exercises, to help cope with the pain. °Second Stage °The second stage of labor begins when your cervix is fully dilated at 10 cm. It continues until you push your baby down through the birth canal and the baby is born. This stage can take only minutes or several hours. °· The location of your baby's head as it moves through the birth canal is reported as a number called a station. If the baby's head has not started its descent, the station is described as being at minus 3 (-3). When your baby's head is at the zero station, it is at the middle of the birth canal and is engaged  in the pelvis. The station of your baby helps indicate the progress of the second stage of labor. °· When your baby is born, your health care provider may hold the baby with his or her head lowered to prevent amniotic fluid, mucus, and blood from getting into the baby's lungs. The baby's mouth and nose may be suctioned with a small bulb syringe to remove any additional fluid. °· Your health care provider may then place the baby on your stomach. It is important to keep the baby from getting cold. To do this, the health care provider will dry the baby off, place the   baby directly on your skin (with no blankets between you and the baby), and cover the baby with warm, dry blankets.   °· The umbilical cord is cut. °Third Stage °During the third stage of labor, your health care provider will deliver the placenta (afterbirth) and make sure your bleeding is under control. The delivery of the placenta usually takes about 5 minutes but can take up to 30 minutes. After the placenta is delivered, a medicine may be given either by IV or injection to help contract the uterus and control bleeding. If you are planning to breastfeed, you can try to do so now. °After you deliver the placenta, your uterus should contract and get very firm. If your uterus does not remain firm, your health care provider will massage it. This is important because the contraction of the uterus helps cut off bleeding at the site where the placenta was attached to your uterus. If your uterus does not contract properly and stay firm, you may continue to bleed heavily. If there is a lot of bleeding, medicines may be given to contract the uterus and stop the bleeding.  °Document Released: 04/03/2008 Document Revised: 11/09/2013 Document Reviewed: 12/14/2012 °[employer]® Patient Information ©2015 [employer]. This information is not intended to replace advice given to you by your health care provider. Make sure you discuss any questions you have with your  health care provider. ° °

## 2015-01-28 NOTE — MAU Note (Signed)
Here for NST scheduled C/S tomorrow for breech presentation, denies any problems.

## 2015-01-29 ENCOUNTER — Inpatient Hospital Stay (HOSPITAL_COMMUNITY): Payer: BLUE CROSS/BLUE SHIELD | Admitting: Anesthesiology

## 2015-01-29 ENCOUNTER — Encounter (HOSPITAL_COMMUNITY)
Admission: AD | Disposition: A | Discharge status: Home / Self Care | Payer: Self-pay | Source: Ambulatory Visit | Attending: Obstetrics

## 2015-01-29 ENCOUNTER — Inpatient Hospital Stay (HOSPITAL_COMMUNITY): Admission: RE | Admit: 2015-01-29 | Payer: BLUE CROSS/BLUE SHIELD | Source: Ambulatory Visit | Admitting: Obstetrics

## 2015-01-29 ENCOUNTER — Encounter (HOSPITAL_COMMUNITY): Payer: Self-pay | Admitting: *Deleted

## 2015-01-29 ENCOUNTER — Inpatient Hospital Stay (HOSPITAL_COMMUNITY)
Admission: AD | Admit: 2015-01-29 | Discharge: 2015-02-01 | DRG: 766 | Disposition: A | Discharge status: Home / Self Care | Payer: BLUE CROSS/BLUE SHIELD | Source: Ambulatory Visit | Attending: Obstetrics | Admitting: Obstetrics

## 2015-01-29 DIAGNOSIS — Z87891 Personal history of nicotine dependence: Secondary | ICD-10-CM | POA: Diagnosis not present

## 2015-01-29 DIAGNOSIS — Z3A41 41 weeks gestation of pregnancy: Secondary | ICD-10-CM | POA: Diagnosis present

## 2015-01-29 DIAGNOSIS — O34219 Maternal care for unspecified type scar from previous cesarean delivery: Secondary | ICD-10-CM

## 2015-01-29 DIAGNOSIS — O321XX0 Maternal care for breech presentation, not applicable or unspecified: Secondary | ICD-10-CM | POA: Diagnosis present

## 2015-01-29 LAB — CBC
HCT: 35.3 % — ABNORMAL LOW (ref 36.0–46.0)
Hemoglobin: 11.8 g/dL — ABNORMAL LOW (ref 12.0–15.0)
MCH: 29.5 pg (ref 26.0–34.0)
MCHC: 33.4 g/dL (ref 30.0–36.0)
MCV: 88.3 fL (ref 78.0–100.0)
Platelets: 329 10*3/uL (ref 150–400)
RBC: 4 MIL/uL (ref 3.87–5.11)
RDW: 14.2 % (ref 11.5–15.5)
WBC: 13.3 10*3/uL — ABNORMAL HIGH (ref 4.0–10.5)

## 2015-01-29 LAB — TYPE AND SCREEN
ABO/RH(D): AB POS
Antibody Screen: NEGATIVE

## 2015-01-29 LAB — ABO/RH: ABO/RH(D): AB POS

## 2015-01-29 SURGERY — Surgical Case
Anesthesia: Spinal | Site: Abdomen

## 2015-01-29 MED ORDER — SCOPOLAMINE 1 MG/3DAYS TD PT72
1.0000 | MEDICATED_PATCH | Freq: Once | TRANSDERMAL | Status: DC
  Filled 2015-01-29: qty 1

## 2015-01-29 MED ORDER — GENTAMICIN SULFATE 40 MG/ML IJ SOLN: INTRAVENOUS | Status: DC | PRN

## 2015-01-29 MED ORDER — ACETAMINOPHEN 325 MG PO TABS
650.0000 mg | ORAL_TABLET | ORAL | Status: DC | PRN
  Administered 2015-01-30: 650 mg via ORAL
  Filled 2015-01-29: qty 2

## 2015-01-29 MED ORDER — DEXTROSE 5 % IV SOLN: INTRAVENOUS | Status: DC | PRN

## 2015-01-29 MED ORDER — ONDANSETRON HCL 4 MG/2ML IJ SOLN
INTRAMUSCULAR | Status: AC
  Filled 2015-01-29: qty 2

## 2015-01-29 MED ORDER — FENTANYL CITRATE (PF) 100 MCG/2ML IJ SOLN
INTRAMUSCULAR | Status: DC | PRN
  Administered 2015-01-29: 20 ug via INTRATHECAL

## 2015-01-29 MED ORDER — KETOROLAC TROMETHAMINE 30 MG/ML IJ SOLN
INTRAMUSCULAR | Status: AC
  Filled 2015-01-29: qty 1

## 2015-01-29 MED ORDER — MORPHINE SULFATE 0.5 MG/ML IJ SOLN
INTRAMUSCULAR | Status: AC
  Filled 2015-01-29: qty 10

## 2015-01-29 MED ORDER — LACTATED RINGERS IV SOLN: INTRAVENOUS | Status: DC

## 2015-01-29 MED ORDER — SIMETHICONE 80 MG PO CHEW
80.0000 mg | CHEWABLE_TABLET | Freq: Three times a day (TID) | ORAL | Status: DC
  Administered 2015-01-29 – 2015-02-01 (×9): 80 mg via ORAL
  Filled 2015-01-29 (×9): qty 1

## 2015-01-29 MED ORDER — PHENYLEPHRINE 8 MG IN D5W 100 ML (0.08MG/ML) PREMIX OPTIME
INJECTION | INTRAVENOUS | Status: AC
  Filled 2015-01-29: qty 100

## 2015-01-29 MED ORDER — NALBUPHINE HCL 10 MG/ML IJ SOLN: 5.0000 mg | INTRAMUSCULAR | Status: DC | PRN

## 2015-01-29 MED ORDER — ONDANSETRON HCL 4 MG/2ML IJ SOLN: 4.0000 mg | Freq: Three times a day (TID) | INTRAMUSCULAR | Status: DC | PRN

## 2015-01-29 MED ORDER — OXYTOCIN 40 UNITS IN LACTATED RINGERS INFUSION - SIMPLE MED: 62.5000 mL/h | INTRAVENOUS | Status: AC

## 2015-01-29 MED ORDER — OXYCODONE-ACETAMINOPHEN 5-325 MG PO TABS
2.0000 | ORAL_TABLET | ORAL | Status: DC | PRN
Start: 2015-01-29 — End: 2015-02-01

## 2015-01-29 MED ORDER — LACTATED RINGERS IV SOLN
INTRAVENOUS | Status: DC | PRN
  Administered 2015-01-29: 11:00:00 via INTRAVENOUS

## 2015-01-29 MED ORDER — OXYTOCIN 10 UNIT/ML IJ SOLN
40.0000 [IU] | INTRAVENOUS | Status: DC | PRN
  Administered 2015-01-29: 40 [IU] via INTRAVENOUS

## 2015-01-29 MED ORDER — NALOXONE HCL 0.4 MG/ML IJ SOLN: 0.4000 mg | INTRAMUSCULAR | Status: DC | PRN

## 2015-01-29 MED ORDER — FENTANYL CITRATE (PF) 100 MCG/2ML IJ SOLN: 25.0000 ug | INTRAMUSCULAR | Status: DC | PRN

## 2015-01-29 MED ORDER — BUPIVACAINE IN DEXTROSE 0.75-8.25 % IT SOLN
INTRATHECAL | Status: DC | PRN
  Administered 2015-01-29: 1.6 mL via INTRATHECAL

## 2015-01-29 MED ORDER — PHENYLEPHRINE 8 MG IN D5W 100 ML (0.08MG/ML) PREMIX OPTIME
INJECTION | INTRAVENOUS | Status: DC | PRN
  Administered 2015-01-29: 60 ug/min via INTRAVENOUS

## 2015-01-29 MED ORDER — KETOROLAC TROMETHAMINE 30 MG/ML IJ SOLN
INTRAMUSCULAR | Status: AC
  Administered 2015-01-29: 30 mg via INTRAVENOUS
  Filled 2015-01-29: qty 1

## 2015-01-29 MED ORDER — SCOPOLAMINE 1 MG/3DAYS TD PT72
1.0000 | MEDICATED_PATCH | Freq: Once | TRANSDERMAL | Status: DC
  Administered 2015-01-29: 1.5 mg via TRANSDERMAL

## 2015-01-29 MED ORDER — OXYTOCIN 10 UNIT/ML IJ SOLN
INTRAMUSCULAR | Status: AC
  Filled 2015-01-29: qty 4

## 2015-01-29 MED ORDER — LACTATED RINGERS IV SOLN
INTRAVENOUS | Status: DC
  Administered 2015-01-29 (×3): via INTRAVENOUS

## 2015-01-29 MED ORDER — DIPHENHYDRAMINE HCL 50 MG/ML IJ SOLN: 12.5000 mg | INTRAMUSCULAR | Status: DC | PRN

## 2015-01-29 MED ORDER — DIPHENHYDRAMINE HCL 25 MG PO CAPS: 25.0000 mg | ORAL_CAPSULE | ORAL | Status: DC | PRN

## 2015-01-29 MED ORDER — OXYCODONE-ACETAMINOPHEN 5-325 MG PO TABS: 1.0000 | ORAL_TABLET | ORAL | Status: DC | PRN

## 2015-01-29 MED ORDER — SODIUM CHLORIDE 0.9 % IJ SOLN: 3.0000 mL | INTRAMUSCULAR | Status: DC | PRN

## 2015-01-29 MED ORDER — LANOLIN HYDROUS EX OINT: 1.0000 "application " | TOPICAL_OINTMENT | CUTANEOUS | Status: DC | PRN

## 2015-01-29 MED ORDER — DIPHENHYDRAMINE HCL 25 MG PO CAPS: 25.0000 mg | ORAL_CAPSULE | Freq: Four times a day (QID) | ORAL | Status: DC | PRN

## 2015-01-29 MED ORDER — TETANUS-DIPHTH-ACELL PERTUSSIS 5-2.5-18.5 LF-MCG/0.5 IM SUSP: 0.5000 mL | Freq: Once | INTRAMUSCULAR | Status: DC

## 2015-01-29 MED ORDER — SCOPOLAMINE 1 MG/3DAYS TD PT72
MEDICATED_PATCH | TRANSDERMAL | Status: AC
  Filled 2015-01-29: qty 1

## 2015-01-29 MED ORDER — KETOROLAC TROMETHAMINE 30 MG/ML IJ SOLN
30.0000 mg | Freq: Four times a day (QID) | INTRAMUSCULAR | Status: DC | PRN
  Administered 2015-01-29: 30 mg via INTRAVENOUS

## 2015-01-29 MED ORDER — WITCH HAZEL-GLYCERIN EX PADS: 1.0000 "application " | MEDICATED_PAD | CUTANEOUS | Status: DC | PRN

## 2015-01-29 MED ORDER — PRENATAL MULTIVITAMIN CH
1.0000 | ORAL_TABLET | Freq: Every day | ORAL | Status: DC
  Administered 2015-01-30 – 2015-02-01 (×3): 1 via ORAL
  Filled 2015-01-29 (×3): qty 1

## 2015-01-29 MED ORDER — SIMETHICONE 80 MG PO CHEW
80.0000 mg | CHEWABLE_TABLET | ORAL | Status: DC
  Administered 2015-01-30 – 2015-01-31 (×3): 80 mg via ORAL
  Filled 2015-01-29 (×3): qty 1

## 2015-01-29 MED ORDER — GENTAMICIN SULFATE 40 MG/ML IJ SOLN
INTRAVENOUS | Status: DC | PRN
  Administered 2015-01-29: 100 mL via INTRAVENOUS

## 2015-01-29 MED ORDER — LACTATED RINGERS IV SOLN: Freq: Once | INTRAVENOUS | Status: DC

## 2015-01-29 MED ORDER — MEPERIDINE HCL 25 MG/ML IJ SOLN: 6.2500 mg | INTRAMUSCULAR | Status: DC | PRN

## 2015-01-29 MED ORDER — SENNOSIDES-DOCUSATE SODIUM 8.6-50 MG PO TABS
2.0000 | ORAL_TABLET | ORAL | Status: DC
  Administered 2015-01-30 – 2015-01-31 (×3): 2 via ORAL
  Filled 2015-01-29 (×3): qty 2

## 2015-01-29 MED ORDER — NALOXONE HCL 1 MG/ML IJ SOLN
1.0000 ug/kg/h | INTRAVENOUS | Status: DC | PRN
  Filled 2015-01-29: qty 2

## 2015-01-29 MED ORDER — NALBUPHINE HCL 10 MG/ML IJ SOLN: 5.0000 mg | Freq: Once | INTRAMUSCULAR | Status: AC | PRN

## 2015-01-29 MED ORDER — MORPHINE SULFATE (PF) 0.5 MG/ML IJ SOLN
INTRAMUSCULAR | Status: DC | PRN
  Administered 2015-01-29: .2 mg via INTRATHECAL

## 2015-01-29 MED ORDER — ZOLPIDEM TARTRATE 5 MG PO TABS: 5.0000 mg | ORAL_TABLET | Freq: Every evening | ORAL | Status: DC | PRN

## 2015-01-29 MED ORDER — FENTANYL CITRATE (PF) 100 MCG/2ML IJ SOLN
INTRAMUSCULAR | Status: AC
  Filled 2015-01-29: qty 2

## 2015-01-29 MED ORDER — KETOROLAC TROMETHAMINE 30 MG/ML IJ SOLN: 30.0000 mg | Freq: Four times a day (QID) | INTRAMUSCULAR | Status: DC | PRN

## 2015-01-29 MED ORDER — IBUPROFEN 600 MG PO TABS
600.0000 mg | ORAL_TABLET | Freq: Four times a day (QID) | ORAL | Status: DC
  Administered 2015-01-29 – 2015-02-01 (×12): 600 mg via ORAL
  Filled 2015-01-29 (×12): qty 1

## 2015-01-29 MED ORDER — SIMETHICONE 80 MG PO CHEW: 80.0000 mg | CHEWABLE_TABLET | ORAL | Status: DC | PRN

## 2015-01-29 MED ORDER — DIBUCAINE 1 % RE OINT: 1.0000 "application " | TOPICAL_OINTMENT | RECTAL | Status: DC | PRN

## 2015-01-29 MED ORDER — ONDANSETRON HCL 4 MG/2ML IJ SOLN
INTRAMUSCULAR | Status: DC | PRN
  Administered 2015-01-29: 4 mg via INTRAVENOUS

## 2015-01-29 MED ORDER — MENTHOL 3 MG MT LOZG: 1.0000 | LOZENGE | OROMUCOSAL | Status: DC | PRN

## 2015-01-29 SURGICAL SUPPLY — 33 items
CLAMP CORD UMBIL (MISCELLANEOUS) IMPLANT
CLOTH BEACON ORANGE TIMEOUT ST (SAFETY) ×3 IMPLANT
CONTAINER PREFILL 10% NBF 15ML (MISCELLANEOUS) ×6 IMPLANT
DRAPE SHEET LG 3/4 BI-LAMINATE (DRAPES) IMPLANT
DRSG OPSITE POSTOP 4X10 (GAUZE/BANDAGES/DRESSINGS) ×3 IMPLANT
DURAPREP 26ML APPLICATOR (WOUND CARE) ×3 IMPLANT
ELECT REM PT RETURN 9FT ADLT (ELECTROSURGICAL) ×3
ELECTRODE REM PT RTRN 9FT ADLT (ELECTROSURGICAL) ×1 IMPLANT
EXTRACTOR VACUUM M CUP 4 TUBE (SUCTIONS) IMPLANT
EXTRACTOR VACUUM M CUP 4' TUBE (SUCTIONS)
GLOVE BIO SURGEON STRL SZ8 (GLOVE) ×3 IMPLANT
GOWN STRL REUS W/TWL LRG LVL3 (GOWN DISPOSABLE) ×6 IMPLANT
KIT ABG SYR 3ML LUER SLIP (SYRINGE) IMPLANT
LIQUID BAND (GAUZE/BANDAGES/DRESSINGS) ×3 IMPLANT
NEEDLE HYPO 22GX1.5 SAFETY (NEEDLE) ×3 IMPLANT
NEEDLE HYPO 25X5/8 SAFETYGLIDE (NEEDLE) ×3 IMPLANT
NS IRRIG 1000ML POUR BTL (IV SOLUTION) ×3 IMPLANT
PACK C SECTION WH (CUSTOM PROCEDURE TRAY) ×3 IMPLANT
PAD OB MATERNITY 4.3X12.25 (PERSONAL CARE ITEMS) ×3 IMPLANT
RTRCTR C-SECT PINK 25CM LRG (MISCELLANEOUS) ×3 IMPLANT
SUT GUT PLAIN 0 CT-3 TAN 27 (SUTURE) IMPLANT
SUT MNCRL 0 VIOLET CTX 36 (SUTURE) ×3 IMPLANT
SUT MNCRL AB 4-0 PS2 18 (SUTURE) IMPLANT
SUT MON AB 2-0 CT1 27 (SUTURE) ×3 IMPLANT
SUT MON AB 3-0 SH 27 (SUTURE)
SUT MON AB 3-0 SH27 (SUTURE) IMPLANT
SUT MONOCRYL 0 CTX 36 (SUTURE) ×6
SUT PLAIN 2 0 XLH (SUTURE) IMPLANT
SUT VIC AB 0 CTX 36 (SUTURE) ×4
SUT VIC AB 0 CTX36XBRD ANBCTRL (SUTURE) ×2 IMPLANT
SYR CONTROL 10ML LL (SYRINGE) ×3 IMPLANT
TOWEL OR 17X24 6PK STRL BLUE (TOWEL DISPOSABLE) ×3 IMPLANT
TRAY FOLEY CATH SILVER 14FR (SET/KITS/TRAYS/PACK) ×3 IMPLANT

## 2015-01-29 NOTE — Op Note (Signed)
Cesarean Section Procedure Note   Julia Santiago   01/29/2015  Indications: Breech Presentation   Pre-operative Diagnosis: 41 Weeks, Breech.   Post-operative Diagnosis: Same   Surgeon: HARPER,CHARLES A  Assistants: Surgical technician  Anesthesia: spinal  Procedure Details:  The patient was seen in the Holding Room. The risks, benefits, complications, treatment options, and expected outcomes were discussed with the patient. The patient concurred with the proposed plan, giving informed consent. The patient was identified as Julia Santiago and the procedure verified as C-Section Delivery. A Time Out was held and the above information confirmed.  After induction of anesthesia, the patient was draped and prepped in the usual sterile manner. A transverse incision was made and carried down through the subcutaneous tissue to the fascia. The fascial incision was made and extended transversely. The fascia was separated from the underlying rectus tissue superiorly and inferiorly. The peritoneum was identified and entered. The peritoneal incision was extended longitudinally. The utero-vesical peritoneal reflection was incised transversely and the bladder flap was bluntly freed from the lower uterine segment. A low transverse uterine incision was made. Delivered from cephalic presentation was a 3486 gram living newborn female infant(s). APGAR (1 MIN): 9   APGAR (5 MINS): 9   APGAR (10 MINS):    A cord ph was not sent. The umbilical cord was clamped and cut cord. A sample was obtained for evaluation. The placenta was removed Intact and appeared normal.  The uterine incision was closed with running locked sutures of 1-0 Monocryl. A second imbricating layer of the same suture was placed.  Hemostasis was observed. The paracolic gutters were irrigated. The parieto peritoneum was closed in a running fashion with 2-0 Vicryl.  The fascia was then reapproximated with running sutures of 0 vicryl.  The skin was  closed with staples.  Instrument, sponge, and needle counts were correct prior the abdominal closure and were correct at the conclusion of the case.    Findings:  Homero Fellers breech.  Normal uterus, ovaries and tubes   Estimated Blood Loss:  Total IV Fluids:   Urine Output: 100CC OF clear urine  Specimens: Placenta to pathology  Complications: no complications  Disposition: PACU - hemodynamically stable.  Maternal Condition: stable   Baby condition / location:  Couplet care / Skin to Skin    Signed: Surgeon(s): Brock Bad, MD

## 2015-01-29 NOTE — Progress Notes (Signed)
Dr Clearance Coots notified of pt's admission and status. Aware of ctx pattern, sve, breech, for primary c/s at 0845. Will see pt about 0800

## 2015-01-29 NOTE — Progress Notes (Signed)
Up to BR at 0534

## 2015-01-29 NOTE — Progress Notes (Signed)
Pt up to Br 

## 2015-01-29 NOTE — H&P (Signed)
Julia Santiago is a 24 y.o. female presenting for cesarean section. Maternal Medical History:  Fetal activity: Perceived fetal activity is normal.    Prenatal Complications - Diabetes: none.    OB History    Gravida Para Term Preterm AB TAB SAB Ectopic Multiple Living   1              Past Medical History  Diagnosis Date  . Depression   . Asthma    Past Surgical History  Procedure Laterality Date  . No past surgeries     Family History: family history includes Heart disease in her maternal grandfather; Stroke in her maternal grandmother. Social History:  reports that she quit smoking about 8 months ago. She has never used smokeless tobacco. She reports that she drinks alcohol. She reports that she does not use illicit drugs.   Prenatal Transfer Tool  Maternal Diabetes: No Genetic Screening: Normal Maternal Ultrasounds/Referrals: Normal Fetal Ultrasounds or other Referrals:  None Maternal Substance Abuse:  No Significant Maternal Medications:  None Significant Maternal Lab Results:  None Other Comments:  None  Review of Systems  All other systems reviewed and are negative.   Dilation: 2.5 Effacement (%): 80 Station: -2 Exam by:: Quintella Baton RNc Blood pressure 130/74, pulse 87, temperature 98.3 F (36.8 C), resp. rate 18, height  (1.702 m), weight 202 lb 12.8 oz (91.989 kg), last menstrual period 04/13/2014. Maternal Exam:  Abdomen: Patient reports no abdominal tenderness. Fetal presentation: breech     Physical Exam  Nursing note and vitals reviewed. Constitutional: She is oriented to person, place, and time. She appears well-developed and well-nourished.  HENT:  Head: Normocephalic and atraumatic.  Eyes: Conjunctivae are normal. Pupils are equal, round, and reactive to light.  Neck: Normal range of motion. Neck supple.  Cardiovascular: Normal rate and regular rhythm.   Respiratory: Effort normal and breath sounds normal.  GI: Soft.  Musculoskeletal:  Normal range of motion.  Neurological: She is alert and oriented to person, place, and time.  Skin: Skin is warm and dry.  Psychiatric: She has a normal mood and affect. Her behavior is normal. Judgment and thought content normal.    Prenatal labs: ABO, Rh:   Antibody:   Rubella:   RPR:    HBsAg:    HIV:    GBS:     Assessment/Plan: 41.3 weeks.  Breech presentation.  Elective primary cesarean section.   HARPER,CHARLES A 01/29/2015, 7:23 AM

## 2015-01-29 NOTE — Progress Notes (Signed)
Dr Emelda Fear on unit and offered to do bedside u/s to confirm breech presentation which he did and baby is breech

## 2015-01-29 NOTE — Anesthesia Postprocedure Evaluation (Signed)
  Anesthesia Post-op Note  Patient: Julia Santiago  Procedure(s) Performed: Procedure(s): CESAREAN SECTION (N/A)  Patient Location: PACU  Anesthesia Type:Spinal  Level of Consciousness: awake  Airway and Oxygen Therapy: Patient Spontanous Breathing  Post-op Pain: none  Post-op Assessment: Post-op Vital signs reviewed, Patient's Cardiovascular Status Stable, Respiratory Function Stable, Patent Airway, No signs of Nausea or vomiting and Pain level controlled LLE Motor Response: Purposeful movement LLE Sensation: Tingling RLE Motor Response: Purposeful movement RLE Sensation: Tingling      Post-op Vital Signs: Reviewed and stable  Last Vitals:  Filed Vitals:   01/29/15 1346  BP: 110/68  Pulse: 57  Temp: 36.6 C  Resp: 18    Complications: No apparent anesthesia complications

## 2015-01-29 NOTE — MAU Note (Signed)
Contractions since 1900. Denies LOF or bleeding. Lost mucous plug. Scheduled for C/S at 0845 today. Baby is breech

## 2015-01-29 NOTE — Anesthesia Preprocedure Evaluation (Signed)
Anesthesia Evaluation  Patient identified by MRN, date of birth, ID band Patient awake    Reviewed: Allergy & Precautions, NPO status , Patient's Chart, lab work & pertinent test results  History of Anesthesia Complications Negative for: history of anesthetic complications  Airway Mallampati: II  TM Distance: >3 FB Neck ROM: Full    Dental  (+) Teeth Intact   Pulmonary neg shortness of breath, asthma , neg sleep apnea, neg recent URI, former smoker,  breath sounds clear to auscultation        Cardiovascular negative cardio ROS  Rhythm:Regular     Neuro/Psych PSYCHIATRIC DISORDERS Depression negative neurological ROS     GI/Hepatic negative GI ROS, Neg liver ROS,   Endo/Other  negative endocrine ROS  Renal/GU negative Renal ROS     Musculoskeletal   Abdominal   Peds  Hematology negative hematology ROS (+)   Anesthesia Other Findings   Reproductive/Obstetrics (+) Pregnancy                             Anesthesia Physical Anesthesia Plan  ASA: II  Anesthesia Plan: Spinal   Post-op Pain Management:    Induction:   Airway Management Planned: Natural Airway  Additional Equipment:   Intra-op Plan:   Post-operative Plan:   Informed Consent: I have reviewed the patients History and Physical, chart, labs and discussed the procedure including the risks, benefits and alternatives for the proposed anesthesia with the patient or authorized representative who has indicated his/her understanding and acceptance.   Dental advisory given  Plan Discussed with: CRNA and Surgeon  Anesthesia Plan Comments:         Anesthesia Quick Evaluation

## 2015-01-29 NOTE — Anesthesia Procedure Notes (Signed)
Spinal Patient location during procedure: OB Staffing Anesthesiologist: Amadea Keagy, CHRIS Preanesthetic Checklist Completed: patient identified, surgical consent, pre-op evaluation, timeout performed, IV checked, risks and benefits discussed and monitors and equipment checked Spinal Block Patient position: sitting Prep: site prepped and draped and DuraPrep Patient monitoring: heart rate, cardiac monitor, continuous pulse ox and blood pressure Approach: midline Location: L3-4 Injection technique: single-shot Needle Needle type: Pencan  Needle gauge: 24 G Needle length: 10 cm Assessment Sensory level: T4   

## 2015-01-29 NOTE — Transfer of Care (Signed)
Immediate Anesthesia Transfer of Care Note  Patient: Julia Santiago  Procedure(s) Performed: Procedure(s): CESAREAN SECTION (N/A)  Patient Location: PACU  Anesthesia Type:Spinal  Level of Consciousness: awake, alert  and oriented  Airway & Oxygen Therapy: Patient Spontanous Breathing and Patient connected to nasal cannula oxygen  Post-op Assessment: Report given to RN and Post -op Vital signs reviewed and stable  Post vital signs: Reviewed and stable  Last Vitals:  Filed Vitals:   01/29/15 0559  BP:   Pulse:   Temp: 36.8 C  Resp:     Complications: No apparent anesthesia complications

## 2015-01-29 NOTE — Consult Note (Addendum)
Neonatology Note:   Attendance at C-section:   I was asked by Dr. Harper to attend this primary C/S at 41 3/7 weeks due to breech presentation. The mother is a G1P0 AB pos, GBS not found with otherwise uncomplicated pregnancy. ROM at delivery, fluid clear. Infant vigorous with good spontaneous cry and tone. Needed only minimal bulb suctioning. Ap 9/9. Lungs clear to ausc in DR. To CN to care of Pediatrician.  Julia Baby C. Dorse Locy, MD 

## 2015-01-29 NOTE — Progress Notes (Signed)
Ok per Dr. Clearance Coots for pt to be xferred to short stay.

## 2015-01-30 LAB — CBC
HCT: 28.8 % — ABNORMAL LOW (ref 36.0–46.0)
Hemoglobin: 9.6 g/dL — ABNORMAL LOW (ref 12.0–15.0)
MCH: 29.4 pg (ref 26.0–34.0)
MCHC: 33.3 g/dL (ref 30.0–36.0)
MCV: 88.3 fL (ref 78.0–100.0)
PLATELETS: 260 10*3/uL (ref 150–400)
RBC: 3.26 MIL/uL — ABNORMAL LOW (ref 3.87–5.11)
RDW: 14.1 % (ref 11.5–15.5)
WBC: 12.5 10*3/uL — ABNORMAL HIGH (ref 4.0–10.5)

## 2015-01-30 LAB — RPR: RPR Ser Ql: NONREACTIVE

## 2015-01-30 NOTE — Progress Notes (Signed)
Subjective: Postpartum Day 1: Cesarean Delivery Patient reports tolerating PO.    Objective: Vital signs in last 24 hours: Temp:  [97.6 F (36.4 C)-98.5 F (36.9 C)] 98 F (36.7 C) (07/24 0555) Pulse Rate:  [55-72] 57 (07/24 0555) Resp:  [16-20] 18 (07/24 0555) BP: (95-117)/(44-69) 106/59 mmHg (07/24 0555) SpO2:  [96 %-100 %] 98 % (07/24 0555)  Physical Exam:  General: alert and no distress Lochia: appropriate Uterine Fundus: firm Incision: healing well DVT Evaluation: No evidence of DVT seen on physical exam.   Recent Labs  01/29/15 0555 01/30/15 0545  HGB 11.8* 9.6*  HCT 35.3* 28.8*    Assessment/Plan: Status post Cesarean section. Doing well postoperatively.  Continue current care.  Shabrea Weldin A 01/30/2015, 6:09 AM

## 2015-01-30 NOTE — Addendum Note (Signed)
Addendum  created 01/30/15 1235 by Yolonda Kida, CRNA   Modules edited: Notes Section   Notes Section:  File: [id number]

## 2015-01-30 NOTE — Lactation Note (Addendum)
This note was copied from the chart of Julia Devoiry Pring. Lactation Consultation Note: Infant is 10 hours old. Mother has breastfed infant 10 times . Infant has had 3 stools and one void. Infant was given 5 ml of colostrum during the night with a spoon and another 3ml with a  #5 fr feeding tube.  Mother also bottle fed once and gave 20 ml of ebm. Staff nurse sat up  a DEBP  During the night and mother pumped 50 ml. She states she has only used once. Mother plans to phone [facility] for certification.Mother has multiple visitors in the room. Advised her to page for [facility] to see feeding. Baby and me book reviewed with basic teaching. Mother receptive to all teaching.    Patient Name: Julia Santiago Today's Date: 01/30/2015 Reason for consult: Initial assessment   Maternal Data Has patient been taught Hand Expression?: Yes Does the patient have breastfeeding experience prior to this delivery?: No  Feeding Feeding Type: Breast Fed  LATCH Score/Interventions                      Lactation Tools Discussed/Used     Consult Status Consult Status: Follow-up Date: 01/30/15 Follow-up type: In-patient    Stevan Born [facility] 01/30/2015, 5:19 PM

## 2015-01-30 NOTE — Anesthesia Postprocedure Evaluation (Signed)
  Anesthesia Post-op Note  Patient: Julia Santiago  Procedure(s) Performed: Procedure(s): CESAREAN SECTION (N/A)  Patient Location: Mother/Baby  Anesthesia Type:Spinal  Level of Consciousness: awake, alert , oriented and patient cooperative  Airway and Oxygen Therapy: Patient Spontanous Breathing  Post-op Pain: mild  Post-op Assessment: Post-op Vital signs reviewed, Patient's Cardiovascular Status Stable, Respiratory Function Stable, Patent Airway, No headache, No backache and Patient able to bend at knees  Post-op Vital Signs: Reviewed and stable  Last Vitals:  Filed Vitals:   01/30/15 0555  BP: 106/59  Pulse: 57  Temp: 36.7 C  Resp: 18    Complications: No apparent anesthesia complications

## 2015-01-31 ENCOUNTER — Encounter (HOSPITAL_COMMUNITY): Payer: Self-pay | Admitting: Obstetrics

## 2015-01-31 NOTE — Lactation Note (Signed)
This note was copied from the chart of Julia Kaliann Inglis. Lactation Consultation Note  Follow up visit made.  Mom is currently feeding baby.  Baby becoming sleepy.  Instructed on breast massage/compression to increase milk flow and intake.  Baby has been cluster feeding.  Encouraged to call for assist/concerns prn.  Patient Name: Julia Santiago Today's Date: 01/31/2015 Reason for consult: Follow-up assessment   Maternal Data    Feeding Feeding Type: Breast Fed Length of feed: 10 min  LATCH Score/Interventions Latch: Grasps breast easily, tongue down, lips flanged, rhythmical sucking. Intervention(s): Breast massage  Audible Swallowing: A few with stimulation Intervention(s): Alternate breast massage  Type of Nipple: Everted at rest and after stimulation  Comfort (Breast/Nipple): Soft / non-tender     Hold (Positioning): No assistance needed to correctly position infant at breast. Intervention(s): Breastfeeding basics reviewed  LATCH Score: 9  Lactation Tools Discussed/Used     Consult Status Consult Status: Follow-up Date: 02/01/15 Follow-up type: In-patient    Huston Foley 01/31/2015, 12:48 PM

## 2015-01-31 NOTE — Progress Notes (Signed)
Patient ID: Julia Santiago, female   DOB: [birth year], 24 y.o.   MRN: [id number] Postop day 2 Blood pressure 112/61 respiration 16 pulse 67 No complaints Abdomen soft dressing dry Legs negative doing well

## 2015-02-01 NOTE — Progress Notes (Signed)
Per Dr. Gaynell Face, pt will have staples removed in office in 2 days. Sherald Barge

## 2015-02-01 NOTE — Progress Notes (Signed)
Patient ID: Julia Santiago, Julia Santiago   DOB: July 06, 1991, 24 y.o.   MRN: [id number] Postop day 3 Blood pressure 118/80 respiration 18 pulse 68 temp 97 4 Patient has no complaints Abdomen soft incision clean and dry lochia moderate legs negative discharge today doing well

## 2015-02-01 NOTE — Plan of Care (Signed)
Problem: Discharge Progression Outcomes Goal: MMR given as ordered Pt declines MMR vaccine

## 2015-02-01 NOTE — Discharge Summary (Signed)
Obstetric Discharge Summary Reason for Admission: onset of labor Prenatal Procedures: none Intrapartum Procedures: cesarean: low cervical, transverse Postpartum Procedures: none Complications-Operative and Postpartum: none HEMOGLOBIN  Date Value Ref Range Status  01/30/2015 9.6* 12.0 - 15.0 g/dL Final   HCT  Date Value Ref Range Status  01/30/2015 28.8* 36.0 - 46.0 % Final    Physical Exam:  General: alert Lochia: appropriate Uterine Fundus: firm Incision: healing well DVT Evaluation: No evidence of DVT seen on physical exam.  Discharge Diagnoses: Term Pregnancy-delivered  Discharge Information: Date: 02/01/2015 Activity: pelvic rest Diet: routine Medications: Percocet Condition: improved Instructions: refer to practice specific booklet Discharge to: home Follow-up Information    Follow up with Kathreen Cosier, MD.   Specialty:  Obstetrics and Gynecology   Contact information:   [address] [place] [address] [phone]       Newborn Data: Live born female  Birth Weight: 7 lb 10.8 oz (3481 g) APGAR: 9, 9  Home with mother.  Sione Baumgarten A 02/01/2015, 6:31 AM

## 2015-02-01 NOTE — Discharge Instructions (Signed)
Discharge instructions   You can wash your hair  Shower  Eat what you want  Drink what you want  See me in 6 weeks  Your ankles are going to swell more in the next 2 weeks than when pregnant  No sex for 6 weeks   Froilan Mclean A, MD 02/01/2015

## 2015-02-01 NOTE — Lactation Note (Signed)
This note was copied from the chart of Julia Santiago. Lactation Consultation Note  Patient Name: Julia Santiago Today's Date: 02/01/2015 Reason for consult: Follow-up assessment Baby 73 hours old. Mom reports that her milk is coming in and baby is nursing well now. Mom states that she is hearing baby swallow at breast and is no longer having to use syringe/supplemention. Discussed engorgement prevention/treatment, and referred mom to Baby and Me booklet for number of diapers to expect by day of life and EBM storage guidelines. Mom aware of OP/BFSG and LC phone line assistance after D/C. Mom states that she used EBM on nipples and this reduced her nipple soreness.   Maternal Data    Feeding    LATCH Score/Interventions                      Lactation Tools Discussed/Used     Consult Status Consult Status: Complete    Geralynn Ochs 02/01/2015, 12:31 PM

## 2015-03-24 LAB — PROCEDURE REPORT - SCANNED: PAP SMEAR: NEGATIVE

## 2015-11-11 ENCOUNTER — Telehealth (HOSPITAL_COMMUNITY): Payer: Self-pay | Admitting: Lactation Services

## 2015-11-11 NOTE — Telephone Encounter (Signed)
See note in her daughter's chart, Lillard AnesXydaijah Dean, DOB: [birth year].  Glenetta HewKim Brice Potteiger, RN, IBCLC

## 2015-11-11 NOTE — Telephone Encounter (Signed)
Opened in error

## 2016-04-03 ENCOUNTER — Encounter: Payer: Self-pay | Admitting: *Deleted

## 2016-09-05 ENCOUNTER — Encounter (HOSPITAL_COMMUNITY): Payer: Self-pay | Admitting: *Deleted

## 2016-09-05 ENCOUNTER — Inpatient Hospital Stay (HOSPITAL_COMMUNITY)
Admission: AD | Admit: 2016-09-05 | Discharge: 2016-09-05 | Disposition: A | Discharge status: Home / Self Care | Payer: BLUE CROSS/BLUE SHIELD | Source: Ambulatory Visit | Attending: Obstetrics & Gynecology | Admitting: Obstetrics & Gynecology

## 2016-09-05 ENCOUNTER — Telehealth (HOSPITAL_COMMUNITY): Payer: Self-pay | Admitting: *Deleted

## 2016-09-05 DIAGNOSIS — O23592 Infection of other part of genital tract in pregnancy, second trimester: Secondary | ICD-10-CM | POA: Insufficient documentation

## 2016-09-05 DIAGNOSIS — N760 Acute vaginitis: Secondary | ICD-10-CM | POA: Insufficient documentation

## 2016-09-05 DIAGNOSIS — B9689 Other specified bacterial agents as the cause of diseases classified elsewhere: Secondary | ICD-10-CM | POA: Diagnosis not present

## 2016-09-05 DIAGNOSIS — Z3A21 21 weeks gestation of pregnancy: Secondary | ICD-10-CM | POA: Diagnosis not present

## 2016-09-05 DIAGNOSIS — R42 Dizziness and giddiness: Secondary | ICD-10-CM | POA: Diagnosis not present

## 2016-09-05 DIAGNOSIS — O26892 Other specified pregnancy related conditions, second trimester: Secondary | ICD-10-CM | POA: Diagnosis not present

## 2016-09-05 DIAGNOSIS — Z87891 Personal history of nicotine dependence: Secondary | ICD-10-CM | POA: Diagnosis not present

## 2016-09-05 LAB — URINALYSIS, ROUTINE W REFLEX MICROSCOPIC
Bilirubin Urine: NEGATIVE
Glucose, UA: NEGATIVE mg/dL
HGB URINE DIPSTICK: NEGATIVE
Ketones, ur: NEGATIVE mg/dL
Leukocytes, UA: NEGATIVE
Nitrite: NEGATIVE
Protein, ur: NEGATIVE mg/dL
SPECIFIC GRAVITY, URINE: 1.019 (ref 1.005–1.030)
pH: 6 (ref 5.0–8.0)

## 2016-09-05 LAB — CBC
HEMATOCRIT: 31.2 % — AB (ref 36.0–46.0)
Hemoglobin: 10.9 g/dL — ABNORMAL LOW (ref 12.0–15.0)
MCH: 31.5 pg (ref 26.0–34.0)
MCHC: 34.9 g/dL (ref 30.0–36.0)
MCV: 90.2 fL (ref 78.0–100.0)
Platelets: 271 10*3/uL (ref 150–400)
RBC: 3.46 MIL/uL — ABNORMAL LOW (ref 3.87–5.11)
RDW: 13.7 % (ref 11.5–15.5)
WBC: 16.3 10*3/uL — ABNORMAL HIGH (ref 4.0–10.5)

## 2016-09-05 LAB — WET PREP, GENITAL
Sperm: NONE SEEN
Trich, Wet Prep: NONE SEEN
YEAST WET PREP: NONE SEEN

## 2016-09-05 LAB — RAPID URINE DRUG SCREEN, HOSP PERFORMED
Amphetamines: NOT DETECTED
BARBITURATES: NOT DETECTED
Benzodiazepines: NOT DETECTED
Cocaine: NOT DETECTED
Opiates: NOT DETECTED
Tetrahydrocannabinol: POSITIVE — AB

## 2016-09-05 MED ORDER — METRONIDAZOLE 500 MG PO TABS: 500.0000 mg | ORAL_TABLET | Freq: Two times a day (BID) | ORAL | 0 refills | Status: DC

## 2016-09-05 MED ORDER — PRENATAL VITAMINS 28-0.8 MG PO TABS: 1.0000 | ORAL_TABLET | Freq: Every day | ORAL | 3 refills | Status: AC

## 2016-09-05 NOTE — Discharge Instructions (Signed)

## 2016-09-05 NOTE — MAU Note (Signed)
Home positive UPT, LMP approx Oct 2017.  Pt states she has been dizzy on and off for the last three days.  Pt states sharp intermit abd pain x 1 week.  Denies vaginal bleeding.  No abnormal discharge.  [facility]  Pt was suppose to have her first prenatal visit at health dept today but wasn't able to.  Pt needs pregnancy verification.

## 2016-09-05 NOTE — MAU Provider Note (Signed)
History     CSN: [id number]  Arrival date and time: 09/05/16 1715   First Provider Initiated Contact with Patient 09/05/16 1814      No chief complaint on file.  HPI   Julia Santiago is 26 y.o. female G2P1001 @ 4765w3d here in MAU with dizziness. The symptoms started 1 week ago. The dizziness occurs frequently throughout the day. She is feeling the dizziness twice during the day "usually when I am most active". She attests to not drinking much water. She tries to eat frequent snacks throughout the day.  Dating is off of an uncertain LMP.   OB History    Gravida Para Term Preterm AB Living   2 1 1     1    SAB TAB Ectopic Multiple Live Births         0 1      Past Medical History:  Diagnosis Date  . Asthma   . Depression     Past Surgical History:  Procedure Laterality Date  . CESAREAN SECTION N/A 01/29/2015   Procedure: CESAREAN SECTION;  Surgeon: Brock Badharles A Harper, MD;  Location: [facility];  Service: Obstetrics;  Laterality: N/A;    Family History  Problem Relation Age of Onset  . Stroke Maternal Grandmother   . Heart disease Maternal Grandfather     Social History  Substance Use Topics  . Smoking status: Former Smoker    Quit date: 05/11/2014  . Smokeless tobacco: Never Used  . Alcohol use 0.0 oz/week     Comment: social use    Allergies:  Allergies  Allergen Reactions  . Penicillins     As a child-reaction unknown    Prescriptions Prior to Admission  Medication Sig Dispense Refill Last Dose  . acetaminophen (TYLENOL) 500 MG tablet Take 500 mg by mouth every 6 (six) hours as needed for headache.   Past Week at Unknown time   Results for orders placed or performed during the hospital encounter of 09/05/16 (from the past 48 hour(s))  Urinalysis, Routine w reflex microscopic     Status: Abnormal   Collection Time: 09/05/16  5:29 PM  Result Value Ref Range   Color, Urine YELLOW YELLOW   APPearance HAZY (A) CLEAR   Specific Gravity, Urine 1.019 1.005 -  1.030   pH 6.0 5.0 - 8.0   Glucose, UA NEGATIVE NEGATIVE mg/dL   Hgb urine dipstick NEGATIVE NEGATIVE   Bilirubin Urine NEGATIVE NEGATIVE   Ketones, ur NEGATIVE NEGATIVE mg/dL   Protein, ur NEGATIVE NEGATIVE mg/dL   Nitrite NEGATIVE NEGATIVE   Leukocytes, UA NEGATIVE NEGATIVE  Urine rapid drug screen (hosp performed)     Status: Abnormal   Collection Time: 09/05/16  5:29 PM  Result Value Ref Range   Opiates NONE DETECTED NONE DETECTED   Cocaine NONE DETECTED NONE DETECTED   Benzodiazepines NONE DETECTED NONE DETECTED   Amphetamines NONE DETECTED NONE DETECTED   Tetrahydrocannabinol POSITIVE (A) NONE DETECTED   Barbiturates NONE DETECTED NONE DETECTED    Comment:        DRUG SCREEN FOR MEDICAL PURPOSES ONLY.  IF CONFIRMATION IS NEEDED FOR ANY PURPOSE, NOTIFY LAB WITHIN 5 DAYS.        LOWEST DETECTABLE LIMITS FOR URINE DRUG SCREEN Drug Class       Cutoff (ng/mL) Amphetamine      1000 Barbiturate      200 Benzodiazepine   200 Tricyclics       300 Opiates  300 Cocaine          300 THC              50   CBC     Status: Abnormal   Collection Time: 09/05/16  6:09 PM  Result Value Ref Range   WBC 16.3 (H) 4.0 - 10.5 K/uL   RBC 3.46 (L) 3.87 - 5.11 MIL/uL   Hemoglobin 10.9 (L) 12.0 - 15.0 g/dL   HCT 16.1 (L) 09.6 - 04.5 %   MCV 90.2 78.0 - 100.0 fL   MCH 31.5 26.0 - 34.0 pg   MCHC 34.9 30.0 - 36.0 g/dL   RDW 40.9 81.1 - 91.4 %   Platelets 271 150 - 400 K/uL  Wet prep, genital     Status: Abnormal   Collection Time: 09/05/16  7:01 PM  Result Value Ref Range   Yeast Wet Prep HPF POC NONE SEEN NONE SEEN   Trich, Wet Prep NONE SEEN NONE SEEN   Clue Cells Wet Prep HPF POC PRESENT (A) NONE SEEN   WBC, Wet Prep HPF POC FEW (A) NONE SEEN    Comment: MODERATE BACTERIA SEEN   Sperm NONE SEEN     Review of Systems  Constitutional: Negative for chills and fever.  Gastrointestinal: Positive for abdominal pain (Sharp pains in both sides of her abdomen that comes and goes.  Currently rates her pain 0/10).  Genitourinary: Negative for vaginal bleeding.  Neurological: Positive for dizziness. Negative for syncope and weakness.   Physical Exam   Blood pressure 135/70, pulse 89, temperature 98.9 F (37.2 C), temperature source Oral, resp. rate 20, height 5\' 8"  (1.727 m), weight 197 lb 4 oz (89.5 kg), last menstrual period 04/08/2016, SpO2 99 %, unknown if currently breastfeeding.  Physical Exam  Constitutional: She is oriented to person, place, and time. Vital signs are normal. She appears well-developed and well-nourished.  Non-toxic appearance. She does not have a sickly appearance. She does not appear ill. No distress.  GI: Soft. She exhibits no distension. There is no tenderness. There is no rebound and no guarding.  Genitourinary:  Genitourinary Comments: Wet prep and GC collected without speculum Cervix: closed, thick, posterior   Musculoskeletal: Normal range of motion.  Neurological: She is alert and oriented to person, place, and time.  Skin: Skin is warm. She is not diaphoretic.  Psychiatric: Her behavior is normal.    MAU Course  Procedures  None  MDM  CBC UA Orthostatic vitals within normal limits Patient 100% on RA, no sign of distress with activity.   Assessment and Plan   A:  1. Episode of dizziness   2. BV (bacterial vaginosis)     P:  Discharge home in stable condition Rx: Flagyl, prenatal vitamins  Strict return precautions Return to MAU if symptoms worsen Start prenatal care.  Change positions slowly Small, frequent meals.   Duane Lope, NP 09/05/2016 8:03 PM

## 2016-09-06 LAB — GC/CHLAMYDIA PROBE AMP (~~LOC~~) NOT AT ARMC
Chlamydia: NEGATIVE
Neisseria Gonorrhea: NEGATIVE

## 2016-10-03 ENCOUNTER — Telehealth: Payer: Self-pay | Admitting: *Deleted

## 2016-10-03 DIAGNOSIS — O0930 Supervision of pregnancy with insufficient antenatal care, unspecified trimester: Secondary | ICD-10-CM

## 2016-10-03 NOTE — Telephone Encounter (Signed)
Pt left message stating that she has been calling for 2 weeks and leaving messages that she wants to schedule an appt and no one is calling her back. Per chart review, no messages have been left on nurse voicemail. I called pt and discussed her request. She stated that she had spoken to people after hours and does not know their names. She reports good FM daily and does not have any problems or questions at this time other than needing to start prenatal care. Her first baby was delivered by C/S by Dr. Clearance CootsHarper who was on-call for Dr. Gaynell FaceMarshall at the time. She desires care from either this office or [facility] [place] [facility]. I advised that someone will call her with appt information within the next few days. Pt will need US for anatomy and dating. This was scheduled on 3/29 @ 4pm.  Pt voiced understanding and stated that a detailed message can be left on her voicemail regarding initial prenatal care appt.

## 2016-10-04 ENCOUNTER — Ambulatory Visit (HOSPITAL_COMMUNITY)
Admission: RE | Admit: 2016-10-04 | Discharge: 2016-10-04 | Disposition: A | Discharge status: Home / Self Care | Payer: BLUE CROSS/BLUE SHIELD | Source: Ambulatory Visit | Attending: Obstetrics & Gynecology | Admitting: Obstetrics & Gynecology

## 2016-10-04 ENCOUNTER — Other Ambulatory Visit: Payer: Self-pay | Admitting: Obstetrics & Gynecology

## 2016-10-04 ENCOUNTER — Encounter: Payer: BLUE CROSS/BLUE SHIELD | Admitting: Advanced Practice Midwife

## 2016-10-04 DIAGNOSIS — O99322 Drug use complicating pregnancy, second trimester: Secondary | ICD-10-CM

## 2016-10-04 DIAGNOSIS — Z3A28 28 weeks gestation of pregnancy: Secondary | ICD-10-CM | POA: Insufficient documentation

## 2016-10-04 DIAGNOSIS — O99323 Drug use complicating pregnancy, third trimester: Secondary | ICD-10-CM | POA: Insufficient documentation

## 2016-10-04 DIAGNOSIS — O0933 Supervision of pregnancy with insufficient antenatal care, third trimester: Secondary | ICD-10-CM | POA: Diagnosis not present

## 2016-10-04 DIAGNOSIS — Z3492 Encounter for supervision of normal pregnancy, unspecified, second trimester: Secondary | ICD-10-CM

## 2016-10-04 DIAGNOSIS — O0930 Supervision of pregnancy with insufficient antenatal care, unspecified trimester: Secondary | ICD-10-CM

## 2016-10-04 DIAGNOSIS — Z3687 Encounter for antenatal screening for uncertain dates: Secondary | ICD-10-CM | POA: Insufficient documentation

## 2016-10-04 DIAGNOSIS — Z363 Encounter for antenatal screening for malformations: Secondary | ICD-10-CM | POA: Diagnosis not present

## 2016-10-04 DIAGNOSIS — O0932 Supervision of pregnancy with insufficient antenatal care, second trimester: Secondary | ICD-10-CM

## 2016-10-04 DIAGNOSIS — Z3A25 25 weeks gestation of pregnancy: Secondary | ICD-10-CM

## 2016-10-04 DIAGNOSIS — O34219 Maternal care for unspecified type scar from previous cesarean delivery: Secondary | ICD-10-CM

## 2016-10-04 DIAGNOSIS — O34211 Maternal care for low transverse scar from previous cesarean delivery: Secondary | ICD-10-CM | POA: Insufficient documentation

## 2016-10-08 ENCOUNTER — Encounter: Payer: Self-pay | Admitting: Obstetrics & Gynecology

## 2016-10-08 DIAGNOSIS — O0990 Supervision of high risk pregnancy, unspecified, unspecified trimester: Secondary | ICD-10-CM | POA: Insufficient documentation

## 2016-10-08 DIAGNOSIS — R9389 Abnormal findings on diagnostic imaging of other specified body structures: Secondary | ICD-10-CM | POA: Insufficient documentation

## 2016-10-15 ENCOUNTER — Encounter: Payer: BLUE CROSS/BLUE SHIELD | Admitting: Advanced Practice Midwife

## 2016-10-23 ENCOUNTER — Other Ambulatory Visit (HOSPITAL_COMMUNITY)
Admission: RE | Admit: 2016-10-23 | Discharge: 2016-10-23 | Disposition: A | Discharge status: Home / Self Care | Payer: BLUE CROSS/BLUE SHIELD | Source: Ambulatory Visit | Attending: Advanced Practice Midwife | Admitting: Advanced Practice Midwife

## 2016-10-23 ENCOUNTER — Encounter: Payer: Self-pay | Admitting: Advanced Practice Midwife

## 2016-10-23 ENCOUNTER — Ambulatory Visit (INDEPENDENT_AMBULATORY_CARE_PROVIDER_SITE_OTHER): Payer: BLUE CROSS/BLUE SHIELD | Admitting: Advanced Practice Midwife

## 2016-10-23 ENCOUNTER — Ambulatory Visit (INDEPENDENT_AMBULATORY_CARE_PROVIDER_SITE_OTHER): Payer: BLUE CROSS/BLUE SHIELD | Admitting: Clinical

## 2016-10-23 VITALS — BP 113/69 | HR 91 | Wt 190.6 lb

## 2016-10-23 DIAGNOSIS — R9389 Abnormal findings on diagnostic imaging of other specified body structures: Secondary | ICD-10-CM

## 2016-10-23 DIAGNOSIS — O0993 Supervision of high risk pregnancy, unspecified, third trimester: Secondary | ICD-10-CM | POA: Insufficient documentation

## 2016-10-23 DIAGNOSIS — N898 Other specified noninflammatory disorders of vagina: Secondary | ICD-10-CM

## 2016-10-23 DIAGNOSIS — F4323 Adjustment disorder with mixed anxiety and depressed mood: Secondary | ICD-10-CM | POA: Diagnosis not present

## 2016-10-23 DIAGNOSIS — L298 Other pruritus: Secondary | ICD-10-CM | POA: Diagnosis present

## 2016-10-23 DIAGNOSIS — R938 Abnormal findings on diagnostic imaging of other specified body structures: Secondary | ICD-10-CM

## 2016-10-23 DIAGNOSIS — Z113 Encounter for screening for infections with a predominantly sexual mode of transmission: Secondary | ICD-10-CM | POA: Diagnosis not present

## 2016-10-23 DIAGNOSIS — B3731 Acute candidiasis of vulva and vagina: Secondary | ICD-10-CM

## 2016-10-23 DIAGNOSIS — Z124 Encounter for screening for malignant neoplasm of cervix: Secondary | ICD-10-CM

## 2016-10-23 DIAGNOSIS — F4322 Adjustment disorder with anxiety: Secondary | ICD-10-CM

## 2016-10-23 DIAGNOSIS — B373 Candidiasis of vulva and vagina: Secondary | ICD-10-CM

## 2016-10-23 DIAGNOSIS — Z3482 Encounter for supervision of other normal pregnancy, second trimester: Secondary | ICD-10-CM

## 2016-10-23 DIAGNOSIS — O34219 Maternal care for unspecified type scar from previous cesarean delivery: Secondary | ICD-10-CM

## 2016-10-23 LAB — POCT URINALYSIS DIP (DEVICE)
Glucose, UA: NEGATIVE mg/dL
HGB URINE DIPSTICK: NEGATIVE
KETONES UR: NEGATIVE mg/dL
Nitrite: NEGATIVE
Protein, ur: NEGATIVE mg/dL
SPECIFIC GRAVITY, URINE: 1.02 (ref 1.005–1.030)
UROBILINOGEN UA: 1 mg/dL (ref 0.0–1.0)
pH: 7 (ref 5.0–8.0)

## 2016-10-23 MED ORDER — TERCONAZOLE 0.4 % VA CREA
1.0000 | TOPICAL_CREAM | Freq: Every day | VAGINAL | 0 refills | Status: DC
Start: 2016-10-23 — End: 2016-12-23

## 2016-10-23 MED ORDER — SERTRALINE HCL 50 MG PO TABS: 50.0000 mg | ORAL_TABLET | Freq: Every day | ORAL | 2 refills | Status: DC

## 2016-10-23 MED ORDER — CONCEPT OB 130-92.4-1 MG PO CAPS: 1.0000 | ORAL_CAPSULE | Freq: Every day | ORAL | 12 refills | Status: DC

## 2016-10-23 NOTE — BH Specialist Note (Signed)
Integrated Behavioral Health Initial Visit  MRN: [id number] Name: Julia Santiago   Session Start time: 8:25-8:28/ 9:25 Session End time: 9:50 Total time: 30 minutes  Type of Service: Integrated Behavioral Health- Individual/Family Interpretor:No. Interpretor Name and Language: n/a   Warm Hand Off Completed.       SUBJECTIVE: Julia Santiago is a 26 y.o. female accompanied by daughter. Patient was referred by Dorathy Kinsman, CNM for anxiety and depression. Patient reports the following symptoms/concerns: Pt states that her primary concern today is preventing a repeat postpartum depression, along with feeling irritable and having sleep difficulty, feeling depressed since coming off antidepressants. Pt would like to begin [facility] meds that are safe during pregnancy, and is open to information about coping with symptoms. 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 Patient may benefit from psychoeducation, brief therapeutic intervention regarding coping with symptoms of anxiety and depression,  along with [facility] medication and contract for safety.  PLAN: 1. Follow up with behavioral health clinician on : One week via phone BH med f/u; two weeks in office visit 2. Behavioral recommendations:  -Pick up new BH medication and take as prescribed by medical provider -Follow contract for safety, as needed -Read educational material regarding coping with symptoms of anxiety and depression -Consider sleep app,nightly, for next two weeks, for improved sleep 3. Referral(s): Integrated [employer] (In Clinic) 4. "From scale of 1-10, how likely are you to follow plan?": 9  Rae Lips, LCSWA  Depression screen [facility] 2/9 10/23/2016  Decreased Interest 2  Down, Depressed, Hopeless 1  PHQ - 2 Score 3  Altered sleeping 3  Tired, decreased energy 2  Change in appetite 1  Feeling bad or failure about yourself  1  Trouble concentrating 1  Moving slowly or fidgety/restless 0  Suicidal thoughts 1  PHQ-9 Score 12   GAD 7 : Generalized Anxiety Score 10/23/2016  Nervous, Anxious, on Edge 2  Control/stop worrying 2  Worry too much - different things 2  Trouble relaxing 2  Restless 0  Easily annoyed or irritable 3  Afraid - awful might happen 1  Total GAD 7 Score 12

## 2016-10-23 NOTE — Patient Instructions (Signed)
Breastfeeding Challenges and Solutions  Even though breastfeeding is natural, it can be challenging, especially in the first few weeks after childbirth. It is normal for problems to arise when starting to breastfeed your new baby, even if you have breastfed before. This document provides some solutions to the most common breastfeeding challenges.  Challenges and solutions  Challenge--Cracked or Sore Nipples  Cracked or sore nipples are commonly experienced by breastfeeding mothers. Cracked or sore nipples often are caused by inadequate latching (when your baby's mouth attaches to your breast to breastfeed). Soreness can also happen if your baby is not positioned properly at your breast. Although nipple cracking and soreness are common during the first week after birth, nipple pain is never normal. If you experience nipple cracking or soreness that lasts longer than 1 week or nipple pain, call your health care provider or lactation consultant.  Solution  Ensure proper latching and positioning of your baby by following the steps below:  · Find a comfortable place to sit or lie down, with your neck and back well supported.  · Place a pillow or rolled up blanket under your baby to bring him or her to the level of your breast (if you are seated).  · Make sure that your baby's abdomen is facing your abdomen.  · Gently massage your breast. With your fingertips, massage from your chest wall toward your nipple in a circular motion. This encourages milk flow. You may need to continue this action during the feeding if your milk flows slowly.  · Support your breast with 4 fingers underneath and your thumb above your nipple. Make sure your fingers are well away from your nipple and your baby’s mouth.  · Stroke your baby's lips gently with your finger or nipple.  · When your baby's mouth is open wide enough, quickly bring your baby to your breast, placing your entire nipple and as much of the colored area around your nipple  (areola) as possible into your baby's mouth.  ? More areola should be visible above your baby's upper lip than below the lower lip.  ? Your baby's tongue should be between his or her lower gum and your breast.  · Ensure that your baby's mouth is correctly positioned around your nipple (latched). Your baby's lips should create a seal on your breast and be turned out (everted).  · It is common for your baby to suck for about 2-3 minutes in order to start the flow of breast milk.    Signs that your baby has successfully latched on to your nipple include:  · Quietly tugging or quietly sucking without causing you pain.  · Swallowing heard between every 3-4 sucks.  · Muscle movement above and in front of his or her ears with sucking.    Signs that your baby has not successfully latched on to nipple include:  · Sucking sounds or smacking sounds from your baby while nursing.  · Nipple pain.    Ensure that your breasts stay moisturized and healthy by:  · Avoiding the use of soap on your nipples.  · Wearing a supportive bra. Avoid wearing underwire-style bras or tight bras.  · Air drying your nipples for 3-4 minutes after each feeding.  · Using only cotton bra pads to absorb breast milk leakage. Leaking of breast milk between feedings is normal. Be sure to change the pads if they become soaked with milk.  · Using lanolin on your nipples after nursing. Lanolin helps to maintain your   skin's normal moisture barrier. If you use pure lanolin you do not need to wash it off before feeding your baby again. Pure lanolin is not toxic to your baby. You may also hand express a few drops of breast milk and gently massage that milk into your nipples, allowing it to air dry.    Challenge--Breast Engorgement  Breast engorgement is the overfilling of your breasts with breast milk. In the first few weeks after giving birth, you may experience breast engorgement. Breast engorgement can make your breasts throb and feel hard, tightly stretched,  warm, and tender. Engorgement peaks about the fifth day after you give birth. Having breast engorgement does not mean you have to stop breastfeeding your baby.  Solution  · Breastfeed when you feel the need to reduce the fullness of your breasts or when your baby shows signs of hunger. This is called "breastfeeding on demand."  · Newborns (babies younger than 4 weeks) often breastfeed every 1-3 hours during the day. You may need to awaken your baby to feed if he or she is asleep at a feeding time.  · Do not allow your baby to sleep longer than 5 hours during the night without a feeding.  · Pump or hand express breast milk before breastfeeding to soften your breast, areola, and nipple.  · Apply warm, moist heat (in the shower or with warm water-soaked hand towels) just before feeding or pumping, or massage your breast before or during breastfeeding. This increases circulation and helps your milk to flow.  · Completely empty your breasts when breastfeeding or pumping. Afterward, wear a snug bra (nursing or regular) or tank top for 1-2 days to signal your body to slightly decrease milk production. Only wear snug bras or tank tops to treat engorgement. Tight bras typically should be avoided by breastfeeding mothers. Once engorgement is relieved, return to wearing regular, loose-fitting clothes.  · Apply ice packs to your breasts to lessen the pain from engorgement and relieve swelling, unless the ice is uncomfortable for you.  · Do not delay feedings. Try to relax when it is time to feed your baby. This helps to trigger your "let-down reflex," which releases milk from your breast.  · Ensure your baby is latched on to your breast and positioned properly while breastfeeding.  · Allow your baby to remain at your breast as long as he or she is latched on well and actively sucking. Your baby will let you know when he or she is done breastfeeding by pulling away from your breast or falling asleep.  · Avoid introducing bottles  or pacifiers to your baby in the early weeks of breastfeeding. Wait to introduce these things until after resolving any breastfeeding challenges.  · Try to pump your milk on the same schedule as when your baby would breastfeed if you are returning to work or away from home for an extended period.  · Drink plenty of fluids to avoid dehydration, which can eventually put you at greater risk of breast engorgement.    If you follow these suggestions, your engorgement should improve in 24-48 hours. If you are still experiencing difficulty, call your lactation consultant or health care provider.  Challenge--Plugged Milk Ducts  Plugged milk ducts occur when the duct does not drain milk effectively and becomes swollen. Wearing a tight-fitting nursing bra or having difficulty with latching may cause plugged milk ducts. Not drinking enough water (8-10 c [1.9-2.4 L] per day) can contribute to plugged milk ducts. Once a   duct has become plugged, hard lumps, soreness, and redness may develop in your breast.  Solution  Do not delay feedings. Feed your baby frequently and try to empty your breasts of milk at each feeding. Try breastfeeding from the affected side first so there is a better chance that the milk will drain completely from that breast. Apply warm, moist towels to your breasts for 5-10 minutes before feeding. Alternatively, a hot shower right before breastfeeding can provide the moist heat that can encourage milk flow. Gentle massage of the sore area before and during a feeding may also help. Avoid wearing tight clothing or bras that put pressure on your breasts. Wear bras that offer good support to your breasts, but avoid underwire bras. If you have a plugged milk duct and develop a fever, you need to see your health care provider.  Challenge--Mastitis  Mastitis is inflammation of your breast. It usually is caused by a bacterial infection and can cause flu-like symptoms. You may develop redness in your breast and a  fever. Often when mastitis occurs, your breast becomes firm, warm, and very painful. The most common causes of mastitis are poor latching, ineffective sucking from your baby, consistent pressure on your breast (possibly from wearing a tight-fitting bra or shirt that restricts the milk flow), unusual stress or fatigue, or missed feedings.  Solution  You will be given antibiotic medicine to treat the infection. It is still important to breastfeed frequently to empty your breasts. Continuing to breastfeed while you recover from mastitis will not harm your baby. Make sure your baby is positioned properly during every feeding. Apply moist heat to your breasts for a few minutes before feeding to help the milk flow and to help your breasts empty more easily.  Challenge--Thrush  Thrush is a yeast infection that can form on your nipples, in your breast, or in your baby's mouth. It causes itching, soreness, burning or stabbing pain, and sometimes a rash.  Solution  You will be given a medicated ointment for your nipples, and your baby will be given a liquid medicine for his or her mouth. It is important that you and your baby are treated at the same time because thrush can be passed between you and your baby. Change disposable nursing pads often. Any bras, towels, or clothing that come in contact with infected areas of your body or your baby's body need to be washed in very hot water every day. Wash your hands and your baby's hands often. All pacifiers, bottle nipples, or toys your baby puts in his or her mouth should be boiled once a day for 20 minutes. After 1 week of treatment, discard pacifiers and bottle nipples and buy new ones. All breast pump parts that touch the milk need to be boiled for 20 minutes every day.  Challenge--Low Milk Supply  You may not be producing enough milk if your baby is not gaining the proper amount of weight. Breast milk production is based on a supply-and-demand system. Your milk supply depends  on how frequently and effectively your baby empties your breast.  Solution  The more you breastfeed and pump, the more breast milk you will produce. It is important that your baby empties at least one of your breasts at each feeding. If this is not happening, then use a breast pump or hand express any milk that remains. This will help to drain as much milk as possible at each feeding. It will also signal your body to produce more   milk. If your baby is not emptying your breasts, it may be due to latching, sucking, or positioning problems. If low milk supply continues after addressing these issues, contact your health care provider or a lactation specialist as soon as possible.  Challenge--Inverted or Flat Nipples  Some women have nipples that turn inward instead of protruding outward. Other women have nipples that are flat. Inverted or flat nipples can sometimes make it more difficult for your baby to latch onto your breast.  Solution  You may be given a small device that pulls out inverted nipples. This device should be applied right before your baby is brought to your breast. You can also try using a breast pump for a short time before placing the baby at your breast. The pump can pull your nipple outwards to help your infant latch more easily. The baby's sucking motion will help the inverted nipple protrude as well.  If you have flat nipples, encourage your baby to latch onto your breast and feed frequently in the early days after birth. This will give your baby practice latching on correctly while your breast is still soft. When your milk supply increases, between the second and fifth day after birth and your breasts become full, your baby will have an easier time latching.  Contact a lactation consultant if you still have concerns. She or he can teach you additional techniques to address breastfeeding problems related to nipple shape and position.  Where to find more information:  [employer]:  www.llli.org  This information is not intended to replace advice given to you by your health care provider. Make sure you discuss any questions you have with your health care provider.  Document Released: 12/17/2005 Document Revised: 12/07/2015 Document Reviewed: 12/19/2012  Elsevier Interactive Patient Education © 2017 [employer]

## 2016-10-23 NOTE — Progress Notes (Signed)
Subjective:    Julia Santiago is being seen today for her first obstetrical visit.  This is a planned pregnancy. She is at [redacted]w[redacted]d gestation by 28 week [place]. Her obstetrical history is significant for late to care, previous C/S for breech.  Patient does intend to breast feed. Pregnancy history fully reviewed.  Had anatomy/dating [place] 10/04/16 which changed EDD to 12/23/16. The left kidney appeared small. F/U US recommended.  Patient reports vaginal discharge and irritation..  Review of Systems:   Review of Systems  Constitutional: Negative for chills and fever.  Gastrointestinal: Negative for abdominal pain, constipation, diarrhea, nausea and vomiting.  Genitourinary: Negative for dysuria, hematuria, vaginal bleeding and vaginal discharge.  Psychiatric/Behavioral: The patient is nervous/anxious.     Objective:     BP 113/69   Pulse 91   Wt 190 lb 9.6 oz (86.5 kg)   LMP 04/08/2016 (Approximate)   BMI 28.98 kg/m  Physical Exam  Nursing note and vitals reviewed. Constitutional: She is oriented to person, place, and time. She appears well-developed and well-nourished. No distress.  Eyes: No scleral icterus.  Neck: No thyromegaly present.  Cardiovascular: Normal rate, regular rhythm and normal heart sounds.   Respiratory: Effort normal and breath sounds normal. No respiratory distress.  GI: Soft. There is no tenderness.  Genitourinary: No breast swelling, tenderness or discharge. There is no lesion on the right labia. There is no lesion on the left labia. Uterus is enlarged. Uterus is not tender. Cervix exhibits discharge. Cervix exhibits no motion tenderness and no friability. Right adnexum displays no tenderness and no fullness. Left adnexum displays no tenderness and no fullness. There is erythema in the vagina. No bleeding in the vagina. Vaginal discharge (large amount of thick, white, curdlike discharge. ) found.  Musculoskeletal: She exhibits no edema or tenderness.  Neurological:  She is alert and oriented to person, place, and time. She has normal reflexes.  Skin: Skin is warm and dry.  Psychiatric: She has a normal mood and affect.    Maternal Exam:  Introitus: Vagina is positive for vaginal discharge (large amount of thick, white, curdlike discharge. ).   FHR 163 Fundal Ht 32 cm  Elevated depression screening.   Assessment:    Pregnancy: G2P1001 Patient Active Problem List   Diagnosis Date Noted  . Pregnancy, supervision, high-risk 10/08/2016  . Ultrasound scan abnormal 10/08/2016  . Previous cesarean delivery, antepartum condition or complication 01/29/2015   1. Encounter for supervision of other normal pregnancy in second trimester  - Prenatal Profile I - Hemoglobinopathy Evaluation - Glucose Tolerance, 2 Hours w/1 Hour - [place] MFM OB COMP + 14 WK; Future - Cytology - PAP - Prenat w/o A Vit-FeFum-FePo-FA (CONCEPT OB) 130-92.4-1 MG CAPS; Take 1 tablet by mouth daily.  Dispense: 30 capsule; Refill: 12  2. Vaginal itching  - Cervicovaginal ancillary only - terconazole (TERAZOL 7) 0.4 % vaginal cream; Place 1 applicator vaginally at bedtime.  Dispense: 45 g; Refill: 0  3. Supervision of high risk pregnancy in third trimester   4. Vaginal yeast infection  - terconazole (TERAZOL 7) 0.4 % vaginal cream; Place 1 applicator vaginally at bedtime.  Dispense: 45 g; Refill: 0  5. Previous cesarean delivery, antepartum condition or complication - Discussed TOLAC vs repeat C/S. Pt undecided. Does not plan on having any more children.   6. Ultrasound scan abnormal - reassess kidneys  7. Adjustment disorder with mixed anxiety and depressed mood  - referral to [facility] - sertraline (ZOLOFT) 50 MG tablet; Take  1 tablet (50 mg total) by mouth daily.  Dispense: 30 tablet; Refill: 2    Plan:     Initial labs drawn. Prenatal vitamins. Problem list reviewed and updated. AFP3 discussed: too late for quad. Discussed NIPS. Will ask insurance about coverage.   Role of ultrasound in pregnancy discussed; fetal survey: results reviewed. Amniocentesis discussed: not indicated. Follow up in 2 weeks. Discussed clinic routines, schedule of care and testing, genetic screening options, involvement of students and residents under the direct supervision of APPs and doctors and presence of female providers. Pt verbalized understanding.   Dorathy Kinsman 10/23/2016

## 2016-10-23 NOTE — Patient Instructions (Addendum)
My Safety Plan:   Step 1: Warning signs (thoughts, images, mood, situation, behavior) that a crisis may be developing: Thoughts go into escape mode, when I feel like I want to isolate  Step 2: Internal coping strategies: Things I can do to take my mind off my problems without contacting another person (music, relaxation technique, physical activity): Listen to music and go for a walk  Step 3: People and social settings that provide distraction:  Name and contact: Candelaria Stagers, [phone]  Step 4: People I can ask for help:  Name, relationship, contact: [place], [phone]  Step 5: Professionals or agencies I can contact during a crisis:  Local urgent care services:      1. 9-1-1     2. [facility] (24/7 walk-in) [address] [place], [place]     3. [facility]: Intake- [phone]/ [phone]  Closest Urgent Care/Emergency Room Address: [facility] Emergency Room / [address] [place], [place], [place] [address] Phone: [phone]  Suicide Prevention Lifeline Phone: [phone]  Step 6: Making the environment safe- Have friend/family member remove from home: Boyfriend, Loraine Leriche * Weapons in the home * Medication in the home (including Tylenol)  Step 7: The one thing that is most important to me and worth living for is: My baby  Signature of Patient: _________________________________________________  Signature of Provider: ________________________________________________

## 2016-10-23 NOTE — Progress Notes (Signed)
DECLINED FLU AND TDAP  NEW OB PACKET GIVEN TO PATIENT  PAP SMEAR 2016 BABY SCRIPTS APP ONLY

## 2016-10-24 LAB — PRENATAL PROFILE I(LABCORP)
ANTIBODY SCREEN: NEGATIVE
BASOS ABS: 0 10*3/uL (ref 0.0–0.2)
BASOS: 0 %
EOS (ABSOLUTE): 0.1 10*3/uL (ref 0.0–0.4)
Eos: 1 %
HEMATOCRIT: 33.4 % — AB (ref 34.0–46.6)
HEP B S AG: NEGATIVE
Hemoglobin: 11.2 g/dL (ref 11.1–15.9)
Immature Grans (Abs): 0 10*3/uL (ref 0.0–0.1)
Immature Granulocytes: 0 %
LYMPHS: 15 %
Lymphocytes Absolute: 1.8 10*3/uL (ref 0.7–3.1)
MCH: 30 pg (ref 26.6–33.0)
MCHC: 33.5 g/dL (ref 31.5–35.7)
MCV: 90 fL (ref 79–97)
MONOCYTES: 4 %
Monocytes Absolute: 0.5 10*3/uL (ref 0.1–0.9)
NEUTROS PCT: 80 %
Neutrophils Absolute: 9.2 10*3/uL — ABNORMAL HIGH (ref 1.4–7.0)
Platelets: 359 10*3/uL (ref 150–379)
RBC: 3.73 x10E6/uL — ABNORMAL LOW (ref 3.77–5.28)
RDW: 12.3 % (ref 12.3–15.4)
RPR Ser Ql: NONREACTIVE
Rh Factor: POSITIVE
WBC: 11.6 10*3/uL — ABNORMAL HIGH (ref 3.4–10.8)

## 2016-10-24 LAB — HEMOGLOBINOPATHY EVALUATION
Ferritin: 11 ng/mL — ABNORMAL LOW (ref 15–150)
HGB C: 0 %
HGB S: 0 %
Hgb A2 Quant: 2.5 % (ref 1.8–3.2)
Hgb A: 97.5 % (ref 96.4–98.8)
Hgb F Quant: 0 % (ref 0.0–2.0)
Hgb Solubility: NEGATIVE
Hgb Variant: 0 %

## 2016-10-24 LAB — CYTOLOGY - PAP: Diagnosis: NEGATIVE

## 2016-10-24 LAB — CERVICOVAGINAL ANCILLARY ONLY
BACTERIAL VAGINITIS: NEGATIVE
Chlamydia: NEGATIVE
NEISSERIA GONORRHEA: NEGATIVE
TRICH (WINDOWPATH): NEGATIVE

## 2016-10-24 LAB — GLUCOSE TOLERANCE, 2 HOURS W/ 1HR
GLUCOSE, 2 HOUR: 136 mg/dL (ref 65–152)
Glucose, 1 hour: 200 mg/dL — ABNORMAL HIGH (ref 65–179)
Glucose, Fasting: 82 mg/dL (ref 65–91)

## 2016-10-29 ENCOUNTER — Telehealth: Payer: Self-pay | Admitting: General Practice

## 2016-10-29 DIAGNOSIS — O24410 Gestational diabetes mellitus in pregnancy, diet controlled: Secondary | ICD-10-CM

## 2016-10-29 MED ORDER — ACCU-CHEK AVIVA PLUS W/DEVICE KIT: 1.0000 | PACK | Freq: Once | 0 refills | Status: AC

## 2016-10-29 MED ORDER — ACCU-CHEK FASTCLIX LANCETS MISC
1.0000 | Freq: Four times a day (QID) | 12 refills | Status: DC
Start: 2016-10-29 — End: 2016-12-19

## 2016-10-29 MED ORDER — GLUCOSE BLOOD VI STRP: ORAL_STRIP | 12 refills | Status: DC

## 2016-10-29 NOTE — Telephone Encounter (Signed)
Per Dorathy Kinsman, patient has GDM and needs diabetes education. Ordered testing supplies. Called and informed patient and offered appt for 4/30 at 10 or 11. Patient verbalized understanding and requests 11 appt. Patient is aware to pick up testing supplies. Patient had no questions

## 2016-10-30 ENCOUNTER — Telehealth: Payer: Self-pay | Admitting: Clinical

## 2016-10-30 NOTE — Telephone Encounter (Signed)
Attempt to f/u for [facility] medication management; no answer, voicemail full, no message left.

## 2016-10-30 NOTE — Telephone Encounter (Signed)
[facility] Medication Management Phone Note  MRN: [id number] NAME: Julia Santiago  Time Call Initiated: 12:34 Time Call Completed: 12:39 Total Call Time: 5 minutes  Current Medications:  Outpatient Medications Prior to Visit  Medication Sig Dispense Refill  . ACCU-CHEK FASTCLIX LANCETS MISC 1 Device by Does not apply route 4 (four) times daily. 1 each 12  . acetaminophen (TYLENOL) 500 MG tablet Take 500 mg by mouth every 6 (six) hours as needed for headache.    . glucose blood (ACCU-CHEK AVIVA PLUS) test strip Use as instructed 100 each 12  . metroNIDAZOLE (FLAGYL) 500 MG tablet Take 1 tablet (500 mg total) by mouth 2 (two) times daily. (Patient not taking: Reported on 10/23/2016) 14 tablet 0  . Prenat w/o A Vit-FeFum-FePo-FA (CONCEPT OB) 130-92.4-1 MG CAPS Take 1 tablet by mouth daily. 30 capsule 12  . Prenatal Vit-Fe Fumarate-FA (PRENATAL VITAMINS) 28-0.8 MG TABS Take 1 tablet by mouth daily. 60 tablet 3  . sertraline (ZOLOFT) 50 MG tablet Take 1 tablet (50 mg total) by mouth daily. 30 tablet 2  . terconazole (TERAZOL 7) 0.4 % vaginal cream Place 1 applicator vaginally at bedtime. 45 g 0   No facility-administered medications prior to visit.     Patient has been able to get all medications filled as prescribed: Yes  Patient is currently taking all medications as prescribed: No: Pt says "I heard it wasn't good to take Zoloft in 3rd trimester, so I'm going to wait to take"  Patient reports experiencing side effects: n/a  Patient describes feeling this way on medications: n/a  Additional patient concerns: Pt wants to know when she is scheduled to see the diabetes educator, and is requesting a call-back from diabetes educator.  Patient advised to schedule appointment with provider for evaluation of medication side effects or additional concerns: No   Jamie C McMannes, LCSWA

## 2016-11-05 ENCOUNTER — Ambulatory Visit (HOSPITAL_COMMUNITY)
Admission: RE | Admit: 2016-11-05 | Discharge: 2016-11-05 | Disposition: A | Discharge status: Home / Self Care | Payer: BLUE CROSS/BLUE SHIELD | Source: Ambulatory Visit | Attending: Advanced Practice Midwife | Admitting: Advanced Practice Midwife

## 2016-11-05 ENCOUNTER — Ambulatory Visit: Payer: BLUE CROSS/BLUE SHIELD | Admitting: *Deleted

## 2016-11-05 ENCOUNTER — Other Ambulatory Visit: Payer: Self-pay | Admitting: Advanced Practice Midwife

## 2016-11-05 ENCOUNTER — Encounter: Payer: BLUE CROSS/BLUE SHIELD | Attending: Obstetrics & Gynecology | Admitting: *Deleted

## 2016-11-05 DIAGNOSIS — Z3A00 Weeks of gestation of pregnancy not specified: Secondary | ICD-10-CM | POA: Insufficient documentation

## 2016-11-05 DIAGNOSIS — O358XX0 Maternal care for other (suspected) fetal abnormality and damage, not applicable or unspecified: Secondary | ICD-10-CM | POA: Insufficient documentation

## 2016-11-05 DIAGNOSIS — O0930 Supervision of pregnancy with insufficient antenatal care, unspecified trimester: Secondary | ICD-10-CM

## 2016-11-05 DIAGNOSIS — O0993 Supervision of high risk pregnancy, unspecified, third trimester: Secondary | ICD-10-CM

## 2016-11-05 DIAGNOSIS — O99323 Drug use complicating pregnancy, third trimester: Secondary | ICD-10-CM | POA: Insufficient documentation

## 2016-11-05 DIAGNOSIS — O99322 Drug use complicating pregnancy, second trimester: Secondary | ICD-10-CM

## 2016-11-05 DIAGNOSIS — Z3A33 33 weeks gestation of pregnancy: Secondary | ICD-10-CM | POA: Diagnosis not present

## 2016-11-05 DIAGNOSIS — O0933 Supervision of pregnancy with insufficient antenatal care, third trimester: Secondary | ICD-10-CM | POA: Insufficient documentation

## 2016-11-05 DIAGNOSIS — O34219 Maternal care for unspecified type scar from previous cesarean delivery: Secondary | ICD-10-CM | POA: Diagnosis not present

## 2016-11-05 DIAGNOSIS — O24410 Gestational diabetes mellitus in pregnancy, diet controlled: Secondary | ICD-10-CM | POA: Diagnosis not present

## 2016-11-05 DIAGNOSIS — Z713 Dietary counseling and surveillance: Secondary | ICD-10-CM | POA: Diagnosis not present

## 2016-11-05 DIAGNOSIS — Z362 Encounter for other antenatal screening follow-up: Secondary | ICD-10-CM

## 2016-11-05 NOTE — Progress Notes (Signed)
  Patient was seen on 11/05/2016 for Gestational Diabetes self-management . Patient came to appointment with Accu Chek Aviva Plus Meter but has not started using it yet. Diet history obtained. The following learning objectives were met by the patient :   States the definition of Gestational Diabetes  States why dietary management is important in controlling blood glucose  Describes the effects of carbohydrates on blood glucose levels  Demonstrates ability to create a balanced meal plan  Demonstrates carbohydrate counting   States when to check blood glucose levels  Demonstrates proper blood glucose monitoring techniques  States the effect of stress and exercise on blood glucose levels  States the importance of limiting caffeine and abstaining from alcohol and smoking  Plan:  Aim for 3 Carb Choices per meal (45 grams) +/- 1 either way  Aim for 1-2 Carbs per snack Begin reading food labels for Total Carbohydrate of foods Consider  increasing your activity level by walking or other activity daily as tolerated Begin checking BG before breakfast and 2 hours after first bite of breakfast, lunch and dinner as directed by MD  Bring Log Book to every medical appointment   Take medication if directed by MD  Patient already has a meter: Accu chek Aviva Plus Patient instructed on use of meter and to test pre breakfast and 2 hours each meal as directed by MD BG today: 95 mg/dl fasting  Patient instructed to monitor glucose levels: FBS: 60 - <90 2 hour: <120  Patient received the following handouts:  Nutrition Diabetes and Pregnancy  Carbohydrate Counting List  Patient will be seen for follow-up as needed.

## 2016-11-07 ENCOUNTER — Ambulatory Visit: Payer: Self-pay

## 2016-11-07 ENCOUNTER — Encounter: Payer: Self-pay | Admitting: Student

## 2016-11-14 ENCOUNTER — Ambulatory Visit (INDEPENDENT_AMBULATORY_CARE_PROVIDER_SITE_OTHER): Payer: BLUE CROSS/BLUE SHIELD | Admitting: Obstetrics and Gynecology

## 2016-11-14 VITALS — BP 111/73 | HR 88 | Wt 195.2 lb

## 2016-11-14 DIAGNOSIS — O24913 Unspecified diabetes mellitus in pregnancy, third trimester: Secondary | ICD-10-CM

## 2016-11-14 DIAGNOSIS — R9389 Abnormal findings on diagnostic imaging of other specified body structures: Secondary | ICD-10-CM

## 2016-11-14 DIAGNOSIS — O24419 Gestational diabetes mellitus in pregnancy, unspecified control: Secondary | ICD-10-CM

## 2016-11-14 DIAGNOSIS — O34219 Maternal care for unspecified type scar from previous cesarean delivery: Secondary | ICD-10-CM

## 2016-11-14 DIAGNOSIS — R938 Abnormal findings on diagnostic imaging of other specified body structures: Secondary | ICD-10-CM

## 2016-11-14 DIAGNOSIS — O0993 Supervision of high risk pregnancy, unspecified, third trimester: Secondary | ICD-10-CM

## 2016-11-14 NOTE — Progress Notes (Signed)
    PRENATAL VISIT NOTE  Subjective:  Julia Santiago is a 26 y.o. G2P1001 at 23w3dbeing seen today for ongoing prenatal care.  She is currently monitored for the following issues for this high-risk pregnancy and has Previous cesarean delivery, antepartum condition or complication; Pregnancy, supervision, high-risk; Ultrasound scan abnormal; and Gestational diabetes mellitus (GDM) affecting pregnancy, antepartum on her problem list.  Patient reports no complaints.  Contractions: Irritability. Vag. Bleeding: None.  Movement: Present. Denies leaking of fluid.   The following portions of the patient's history were reviewed and updated as appropriate: allergies, current medications, past family history, past medical history, past social history, past surgical history and problem list. Problem list updated.  Objective:   Vitals:   11/14/16 0814  BP: 111/73  Pulse: 88  Weight: 195 lb 3.2 oz (88.5 kg)    Fetal Status: Fetal Heart Rate (bpm): 146 Fundal Height: 34 cm Movement: Present     General:  Alert, oriented and cooperative. Patient is in no acute distress.  Skin: Skin is warm and dry. No rash noted.   Cardiovascular: Normal heart rate noted  Respiratory: Normal respiratory effort, no problems with respiration noted  Abdomen: Soft, gravid, appropriate for gestational age. Pain/Pressure: Present     Pelvic:  Cervical exam deferred        Extremities: Normal range of motion.  Edema: None  Mental Status: Normal mood and affect. Normal behavior. Normal judgment and thought content.   Assessment and Plan:  Pregnancy: G2P1001 at 347w3d1. Supervision of high risk pregnancy in third trimester Patient is doing well without complaints   2. Previous cesarean delivery, antepartum condition or complication Risks and benefits of TOLAC vs RCS reviewed with the patient. She remains undecided but will give an answer at her next visit Plans Nexplanon for contraception  3. Ultrasound scan  abnormal Follow up in neonatal period regarding left kidney  4. Gestational diabetes mellitus (GDM) affecting pregnancy, antepartum Patient met with diabetic educator but has not been checking CBG Importance of a good partnership between patient and physician discussed in order to decrease adverse outcomes associated with poorly controled DM in pregnancy Patient will return in 1 week for CBG check  Preterm labor symptoms and general obstetric precautions including but not limited to vaginal bleeding, contractions, leaking of fluid and fetal movement were reviewed in detail with the patient. Please refer to After Visit Summary for other counseling recommendations.  Return in about 1 week (around 11/21/2016) for [place]  Tiah Heckel, PeVickii ChafeMD

## 2016-11-22 ENCOUNTER — Encounter: Payer: Self-pay | Admitting: Obstetrics & Gynecology

## 2016-11-28 ENCOUNTER — Encounter: Payer: Self-pay | Admitting: Obstetrics and Gynecology

## 2016-11-28 NOTE — Progress Notes (Signed)
Patient did not keep OB appointment for 11/28/2016.  Julia Santiago, Jr MD Attending [facility](Faculty Practice)

## 2016-12-19 ENCOUNTER — Ambulatory Visit (INDEPENDENT_AMBULATORY_CARE_PROVIDER_SITE_OTHER): Payer: BLUE CROSS/BLUE SHIELD | Admitting: Obstetrics and Gynecology

## 2016-12-19 ENCOUNTER — Other Ambulatory Visit (HOSPITAL_COMMUNITY)
Admission: RE | Admit: 2016-12-19 | Discharge: 2016-12-19 | Disposition: A | Discharge status: Home / Self Care | Payer: BLUE CROSS/BLUE SHIELD | Source: Ambulatory Visit | Attending: Obstetrics and Gynecology | Admitting: Obstetrics and Gynecology

## 2016-12-19 ENCOUNTER — Ambulatory Visit: Payer: Self-pay

## 2016-12-19 ENCOUNTER — Telehealth (HOSPITAL_COMMUNITY): Payer: Self-pay

## 2016-12-19 VITALS — BP 116/56 | HR 86 | Wt 217.8 lb

## 2016-12-19 DIAGNOSIS — R9389 Abnormal findings on diagnostic imaging of other specified body structures: Secondary | ICD-10-CM

## 2016-12-19 DIAGNOSIS — O0993 Supervision of high risk pregnancy, unspecified, third trimester: Secondary | ICD-10-CM

## 2016-12-19 DIAGNOSIS — O0933 Supervision of pregnancy with insufficient antenatal care, third trimester: Secondary | ICD-10-CM

## 2016-12-19 DIAGNOSIS — O0930 Supervision of pregnancy with insufficient antenatal care, unspecified trimester: Secondary | ICD-10-CM | POA: Insufficient documentation

## 2016-12-19 DIAGNOSIS — O24419 Gestational diabetes mellitus in pregnancy, unspecified control: Secondary | ICD-10-CM

## 2016-12-19 DIAGNOSIS — O24410 Gestational diabetes mellitus in pregnancy, diet controlled: Secondary | ICD-10-CM | POA: Diagnosis not present

## 2016-12-19 DIAGNOSIS — O368130 Decreased fetal movements, third trimester, not applicable or unspecified: Secondary | ICD-10-CM

## 2016-12-19 DIAGNOSIS — O34219 Maternal care for unspecified type scar from previous cesarean delivery: Secondary | ICD-10-CM

## 2016-12-19 LAB — GLUCOSE, CAPILLARY: Glucose-Capillary: 68 mg/dL (ref 65–99)

## 2016-12-19 MED ORDER — BLOOD GLUCOSE MONITOR KIT: PACK | 0 refills | Status: DC

## 2016-12-19 MED ORDER — GLUCOSE BLOOD VI STRP: ORAL_STRIP | 12 refills | Status: DC

## 2016-12-19 MED ORDER — ACCU-CHEK FASTCLIX LANCETS MISC: 1.0000 | Freq: Four times a day (QID) | 12 refills | Status: DC

## 2016-12-19 NOTE — Telephone Encounter (Signed)
Called patient to inform her of surgery date and time, no answer, left voicemail, advised patient to call me back if she had any questions

## 2016-12-19 NOTE — Progress Notes (Signed)
Pt informed that the ultrasound is considered a limited OB ultrasound and is not intended to be a complete ultrasound exam.  Patient also informed that the ultrasound is not being completed with the intent of assessing for fetal or placental anomalies or any pelvic abnormalities.  Explained that the purpose of today's ultrasound is to assess for presentation and amniotic fluid volume.  Patient acknowledges the purpose of the exam and the limitations of the study.    

## 2016-12-19 NOTE — Progress Notes (Signed)
Prenatal Visit Note Date: 12/19/2016 [facility]: [facility]  Subjective:  Julia Santiago Early CharsSiler is a 26 y.o. G2P1001 at [redacted]w[redacted]d being seen today for ongoing prenatal care.  She is currently monitored for the following issues for this high-risk pregnancy and has Previous cesarean delivery, antepartum condition or complication; Pregnancy, supervision, high-risk; Ultrasound scan abnormal; and Gestational diabetes mellitus (GDM) affecting pregnancy, antepartum on her problem list.  Patient reports +vag Santiago/c Contractions: Not present. Vag. Bleeding: None.  Movement: (!) Decreased. Denies leaking of fluid.   The following portions of the patient's history were reviewed and updated as appropriate: allergies, current medications, past family history, past medical history, past social history, past surgical history and problem list. Problem list updated.  Objective:   Vitals:   12/19/16 1450  BP: (!) 116/56  Pulse: 86  Weight: 217 lb 12.8 oz (98.8 kg)    Fetal Status: Fetal Heart Rate (bpm): 134   Movement: (!) Decreased     General:  Alert, oriented and cooperative. Patient is in no acute distress.  Skin: Skin is warm and dry. No rash noted.   Cardiovascular: Normal heart rate noted  Respiratory: Normal respiratory effort, no problems with respiration noted  Abdomen: Soft, gravid, appropriate for gestational age. Pain/Pressure: Present     Pelvic:  Cervical exam performed        Extremities: Normal range of motion.  Edema: Mild pitting, slight indentation  Mental Status: Normal mood and affect. Normal behavior. Normal judgment and thought content.   Urinalysis:      Assessment and Plan:  Pregnancy: G2P1001 at 665w3d  1. Gestational diabetes mellitus (GDM) affecting pregnancy, antepartum Patient states that she lost her supplies. Hasn't check her BS in 3wks. Random today 68 and do NST (135 baseline, +accels, no decels, mod variability, toco quiet) and try to add on for  AFI-->14, cephalic. If all normal, then will set up for rpt c-section later this week. On 4/30 her afi was 14.2, 68% efw 2232gm, AC 67%. Repeat supplies sent in to pharmacy and pt told to check at home. Risk of IUFD Santiago/w pt and importance of BS monitoring.   2. Ultrasound scan abnormal Slightly small fetal left kidney. Let peds know at delivery  3. Previous cesarean delivery, antepartum condition or complication Desires repeat. [facility]   4. Supervision of high risk pregnancy in third trimester See above. Pt states that she moved and her care is now fixed and that is why she hasn't been to a PNV since 34wks. Will do NST for decreased FM. Can tx yeast infection after delivery.    Term labor symptoms and general obstetric precautions including but not limited to vaginal bleeding, contractions, leaking of fluid and fetal movement were reviewed in detail with the patient. Please refer to After Visit Summary for other counseling recommendations.  No Follow-up on file.   Antelope BingPickens, Marlow Hendrie, MD

## 2016-12-20 ENCOUNTER — Other Ambulatory Visit: Payer: Self-pay | Admitting: Obstetrics and Gynecology

## 2016-12-20 ENCOUNTER — Telehealth (HOSPITAL_COMMUNITY): Payer: Self-pay | Admitting: *Deleted

## 2016-12-20 ENCOUNTER — Encounter (HOSPITAL_COMMUNITY): Payer: Self-pay | Admitting: *Deleted

## 2016-12-20 LAB — POCT URINALYSIS DIP (DEVICE)
Bilirubin Urine: NEGATIVE
GLUCOSE, UA: NEGATIVE mg/dL
KETONES UR: 15 mg/dL — AB
Nitrite: NEGATIVE
Protein, ur: NEGATIVE mg/dL
SPECIFIC GRAVITY, URINE: 1.02 (ref 1.005–1.030)
Urobilinogen, UA: 1 mg/dL (ref 0.0–1.0)
pH: 7 (ref 5.0–8.0)

## 2016-12-20 NOTE — Patient Instructions (Signed)
20 Duchess D Noll  12/20/2016   Your procedure is scheduled on:  12/21/2016  [facility] the [facility] at 0900 AM.  .   Call this number if you have problems the morning of surgery: [phone]   Remember:   Do not eat food:After Midnight.  Do not drink clear liquids: After Midnight.  Take these medicines the morning of surgery with A SIP OF WATER: none   Do not wear jewelry, make-up or nail polish.  Do not wear lotions, powders, or perfumes. Do not wear deodorant.  Do not shave 48 hours prior to surgery.  Do not bring valuables to the hospital.  [facility] not   responsible for any belongings or valuables brought to the hospital.  Contacts, dentures or bridgework may not be worn into surgery.  Leave suitcase in the car. After surgery it may be brought to your room.  For patients admitted to the hospital, checkout time is 11:00 AM the day of              discharge.   Patients discharged the day of surgery will not be allowed to drive             home.  Name and phone number of your driver: na  Special Instructions:   N/A   Please read over the following fact sheets that you were given:   Surgical Site Infection Prevention

## 2016-12-20 NOTE — Telephone Encounter (Signed)
Preadmission screen Left voicemail regarding medical history and discussing preop instructions.

## 2016-12-21 ENCOUNTER — Inpatient Hospital Stay (HOSPITAL_COMMUNITY)
Admission: RE | Admit: 2016-12-21 | Discharge: 2016-12-23 | DRG: 766 | Disposition: A | Discharge status: Home / Self Care | Payer: BLUE CROSS/BLUE SHIELD | Source: Ambulatory Visit | Attending: Obstetrics and Gynecology | Admitting: Obstetrics and Gynecology

## 2016-12-21 ENCOUNTER — Inpatient Hospital Stay (HOSPITAL_COMMUNITY): Payer: BLUE CROSS/BLUE SHIELD | Admitting: Anesthesiology

## 2016-12-21 ENCOUNTER — Encounter (HOSPITAL_COMMUNITY)
Admission: RE | Disposition: A | Discharge status: Home / Self Care | Payer: Self-pay | Source: Ambulatory Visit | Attending: Obstetrics and Gynecology

## 2016-12-21 ENCOUNTER — Encounter (HOSPITAL_COMMUNITY): Payer: Self-pay | Admitting: Anesthesiology

## 2016-12-21 DIAGNOSIS — O34211 Maternal care for low transverse scar from previous cesarean delivery: Principal | ICD-10-CM | POA: Diagnosis present

## 2016-12-21 DIAGNOSIS — F329 Major depressive disorder, single episode, unspecified: Secondary | ICD-10-CM | POA: Diagnosis present

## 2016-12-21 DIAGNOSIS — O99344 Other mental disorders complicating childbirth: Secondary | ICD-10-CM | POA: Diagnosis present

## 2016-12-21 DIAGNOSIS — O99345 Other mental disorders complicating the puerperium: Secondary | ICD-10-CM | POA: Diagnosis not present

## 2016-12-21 DIAGNOSIS — O34219 Maternal care for unspecified type scar from previous cesarean delivery: Secondary | ICD-10-CM

## 2016-12-21 DIAGNOSIS — Z98891 History of uterine scar from previous surgery: Secondary | ICD-10-CM

## 2016-12-21 DIAGNOSIS — O24420 Gestational diabetes mellitus in childbirth, diet controlled: Secondary | ICD-10-CM | POA: Diagnosis present

## 2016-12-21 DIAGNOSIS — Z3A39 39 weeks gestation of pregnancy: Secondary | ICD-10-CM

## 2016-12-21 DIAGNOSIS — Z87891 Personal history of nicotine dependence: Secondary | ICD-10-CM

## 2016-12-21 DIAGNOSIS — O24419 Gestational diabetes mellitus in pregnancy, unspecified control: Secondary | ICD-10-CM | POA: Diagnosis present

## 2016-12-21 DIAGNOSIS — Z880 Allergy status to penicillin: Secondary | ICD-10-CM | POA: Diagnosis not present

## 2016-12-21 DIAGNOSIS — O0990 Supervision of high risk pregnancy, unspecified, unspecified trimester: Secondary | ICD-10-CM

## 2016-12-21 DIAGNOSIS — F53 Postpartum depression: Secondary | ICD-10-CM

## 2016-12-21 HISTORY — DX: Gestational diabetes mellitus in pregnancy, unspecified control: O24.419

## 2016-12-21 LAB — CBC
HEMATOCRIT: 30.3 % — AB (ref 36.0–46.0)
HEMOGLOBIN: 10.2 g/dL — AB (ref 12.0–15.0)
MCH: 29.1 pg (ref 26.0–34.0)
MCHC: 33.7 g/dL (ref 30.0–36.0)
MCV: 86.6 fL (ref 78.0–100.0)
Platelets: 273 10*3/uL (ref 150–400)
RBC: 3.5 MIL/uL — AB (ref 3.87–5.11)
RDW: 13.8 % (ref 11.5–15.5)
WBC: 8.8 10*3/uL (ref 4.0–10.5)

## 2016-12-21 LAB — RAPID HIV SCREEN (HIV 1/2 AB+AG)
HIV 1/2 Antibodies: NONREACTIVE
HIV-1 P24 Antigen - HIV24: NONREACTIVE

## 2016-12-21 LAB — CERVICOVAGINAL ANCILLARY ONLY
Chlamydia: NEGATIVE
Neisseria Gonorrhea: NEGATIVE
TRICH (WINDOWPATH): NEGATIVE

## 2016-12-21 LAB — TYPE AND SCREEN
ABO/RH(D): AB POS
ANTIBODY SCREEN: NEGATIVE

## 2016-12-21 LAB — GLUCOSE, CAPILLARY: GLUCOSE-CAPILLARY: 66 mg/dL (ref 65–99)

## 2016-12-21 SURGERY — Surgical Case
Anesthesia: Spinal | Site: Abdomen | Wound class: Clean Contaminated

## 2016-12-21 MED ORDER — ONDANSETRON HCL 4 MG/2ML IJ SOLN
INTRAMUSCULAR | Status: AC
  Filled 2016-12-21: qty 2

## 2016-12-21 MED ORDER — PHENYLEPHRINE HCL 10 MG/ML IJ SOLN
INTRAMUSCULAR | Status: DC | PRN
  Administered 2016-12-21 (×3): 80 ug via INTRAVENOUS

## 2016-12-21 MED ORDER — NALOXONE HCL 0.4 MG/ML IJ SOLN: 0.4000 mg | INTRAMUSCULAR | Status: DC | PRN

## 2016-12-21 MED ORDER — NALOXONE HCL 2 MG/2ML IJ SOSY
1.0000 ug/kg/h | PREFILLED_SYRINGE | INTRAVENOUS | Status: DC | PRN
  Filled 2016-12-21: qty 2

## 2016-12-21 MED ORDER — SIMETHICONE 80 MG PO CHEW
80.0000 mg | CHEWABLE_TABLET | Freq: Three times a day (TID) | ORAL | Status: DC
  Administered 2016-12-22 – 2016-12-23 (×4): 80 mg via ORAL
  Filled 2016-12-21 (×4): qty 1

## 2016-12-21 MED ORDER — KETOROLAC TROMETHAMINE 30 MG/ML IJ SOLN
30.0000 mg | Freq: Four times a day (QID) | INTRAMUSCULAR | Status: AC | PRN
  Administered 2016-12-21: 30 mg via INTRAVENOUS
  Filled 2016-12-21: qty 1

## 2016-12-21 MED ORDER — MEPERIDINE HCL 25 MG/ML IJ SOLN: 6.2500 mg | INTRAMUSCULAR | Status: DC | PRN

## 2016-12-21 MED ORDER — LACTATED RINGERS IV SOLN: INTRAVENOUS | Status: DC

## 2016-12-21 MED ORDER — TETANUS-DIPHTH-ACELL PERTUSSIS 5-2.5-18.5 LF-MCG/0.5 IM SUSP
0.5000 mL | Freq: Once | INTRAMUSCULAR | Status: AC
  Administered 2016-12-22: 0.5 mL via INTRAMUSCULAR
  Filled 2016-12-21: qty 0.5

## 2016-12-21 MED ORDER — LACTATED RINGERS IV SOLN
INTRAVENOUS | Status: DC | PRN
  Administered 2016-12-21: 12:00:00 via INTRAVENOUS

## 2016-12-21 MED ORDER — KETOROLAC TROMETHAMINE 30 MG/ML IJ SOLN
INTRAMUSCULAR | Status: AC
  Filled 2016-12-21: qty 1

## 2016-12-21 MED ORDER — MORPHINE SULFATE (PF) 0.5 MG/ML IJ SOLN
INTRAMUSCULAR | Status: DC | PRN
  Administered 2016-12-21: .2 mg via INTRATHECAL

## 2016-12-21 MED ORDER — DIPHENHYDRAMINE HCL 25 MG PO CAPS
25.0000 mg | ORAL_CAPSULE | ORAL | Status: DC | PRN
  Filled 2016-12-21: qty 1

## 2016-12-21 MED ORDER — LACTATED RINGERS IV SOLN
INTRAVENOUS | Status: DC
  Administered 2016-12-21: 12:00:00 via INTRAVENOUS
  Administered 2016-12-21: 125 mL via INTRAVENOUS

## 2016-12-21 MED ORDER — NALBUPHINE HCL 10 MG/ML IJ SOLN: 5.0000 mg | INTRAMUSCULAR | Status: DC | PRN

## 2016-12-21 MED ORDER — METOCLOPRAMIDE HCL 5 MG/ML IJ SOLN: 10.0000 mg | Freq: Once | INTRAMUSCULAR | Status: DC | PRN

## 2016-12-21 MED ORDER — SIMETHICONE 80 MG PO CHEW
80.0000 mg | CHEWABLE_TABLET | ORAL | Status: DC
  Administered 2016-12-21 – 2016-12-22 (×2): 80 mg via ORAL
  Filled 2016-12-21 (×2): qty 1

## 2016-12-21 MED ORDER — LACTATED RINGERS IV SOLN
INTRAVENOUS | Status: DC
  Administered 2016-12-21 – 2016-12-22 (×2): via INTRAVENOUS

## 2016-12-21 MED ORDER — KETOROLAC TROMETHAMINE 30 MG/ML IJ SOLN
30.0000 mg | Freq: Four times a day (QID) | INTRAMUSCULAR | Status: AC | PRN
  Administered 2016-12-21: 30 mg via INTRAMUSCULAR

## 2016-12-21 MED ORDER — NALBUPHINE HCL 10 MG/ML IJ SOLN: 5.0000 mg | Freq: Once | INTRAMUSCULAR | Status: DC | PRN

## 2016-12-21 MED ORDER — SOD CITRATE-CITRIC ACID 500-334 MG/5ML PO SOLN
30.0000 mL | Freq: Once | ORAL | Status: AC
  Administered 2016-12-21: 30 mL via ORAL
  Filled 2016-12-21: qty 15

## 2016-12-21 MED ORDER — IBUPROFEN 600 MG PO TABS
600.0000 mg | ORAL_TABLET | Freq: Four times a day (QID) | ORAL | Status: DC
  Administered 2016-12-22 – 2016-12-23 (×6): 600 mg via ORAL
  Filled 2016-12-21 (×6): qty 1

## 2016-12-21 MED ORDER — PHENYLEPHRINE 8 MG IN D5W 100 ML (0.08MG/ML) PREMIX OPTIME
INJECTION | INTRAVENOUS | Status: AC
  Filled 2016-12-21: qty 100

## 2016-12-21 MED ORDER — MENTHOL 3 MG MT LOZG: 1.0000 | LOZENGE | OROMUCOSAL | Status: DC | PRN

## 2016-12-21 MED ORDER — OXYCODONE HCL 5 MG PO TABS: 10.0000 mg | ORAL_TABLET | ORAL | Status: DC | PRN

## 2016-12-21 MED ORDER — SCOPOLAMINE 1 MG/3DAYS TD PT72: 1.0000 | MEDICATED_PATCH | Freq: Once | TRANSDERMAL | Status: DC

## 2016-12-21 MED ORDER — SIMETHICONE 80 MG PO CHEW: 80.0000 mg | CHEWABLE_TABLET | ORAL | Status: DC | PRN

## 2016-12-21 MED ORDER — ONDANSETRON HCL 4 MG/2ML IJ SOLN
4.0000 mg | Freq: Three times a day (TID) | INTRAMUSCULAR | Status: DC | PRN
  Administered 2016-12-21: 4 mg via INTRAVENOUS
  Filled 2016-12-21: qty 2

## 2016-12-21 MED ORDER — MORPHINE SULFATE (PF) 0.5 MG/ML IJ SOLN
INTRAMUSCULAR | Status: AC
  Filled 2016-12-21: qty 10

## 2016-12-21 MED ORDER — SENNOSIDES-DOCUSATE SODIUM 8.6-50 MG PO TABS
2.0000 | ORAL_TABLET | ORAL | Status: DC
  Administered 2016-12-21 – 2016-12-22 (×2): 2 via ORAL
  Filled 2016-12-21 (×2): qty 2

## 2016-12-21 MED ORDER — GENTAMICIN SULFATE 40 MG/ML IJ SOLN
INTRAVENOUS | Status: AC
  Administered 2016-12-21: 115.5 mL via INTRAVENOUS
  Filled 2016-12-21: qty 9.5

## 2016-12-21 MED ORDER — SCOPOLAMINE 1 MG/3DAYS TD PT72
1.0000 | MEDICATED_PATCH | Freq: Once | TRANSDERMAL | Status: DC
  Administered 2016-12-21: 1.5 mg via TRANSDERMAL
  Filled 2016-12-21: qty 1

## 2016-12-21 MED ORDER — OXYCODONE HCL 5 MG PO TABS: 5.0000 mg | ORAL_TABLET | ORAL | Status: DC | PRN

## 2016-12-21 MED ORDER — OXYTOCIN 10 UNIT/ML IJ SOLN
INTRAVENOUS | Status: DC | PRN
  Administered 2016-12-21: 40 [IU] via INTRAVENOUS

## 2016-12-21 MED ORDER — ACETAMINOPHEN 325 MG PO TABS
650.0000 mg | ORAL_TABLET | ORAL | Status: DC | PRN
  Administered 2016-12-23: 650 mg via ORAL
  Filled 2016-12-21: qty 2

## 2016-12-21 MED ORDER — BUPIVACAINE IN DEXTROSE 0.75-8.25 % IT SOLN
INTRATHECAL | Status: DC | PRN
  Administered 2016-12-21: 1.4 mL via INTRATHECAL

## 2016-12-21 MED ORDER — FENTANYL CITRATE (PF) 100 MCG/2ML IJ SOLN: 25.0000 ug | INTRAMUSCULAR | Status: DC | PRN

## 2016-12-21 MED ORDER — PHENYLEPHRINE 40 MCG/ML (10ML) SYRINGE FOR IV PUSH (FOR BLOOD PRESSURE SUPPORT)
PREFILLED_SYRINGE | INTRAVENOUS | Status: AC
  Filled 2016-12-21: qty 20

## 2016-12-21 MED ORDER — ONDANSETRON HCL 4 MG/2ML IJ SOLN
INTRAMUSCULAR | Status: DC | PRN
Start: 2016-12-21 — End: 2016-12-21
  Administered 2016-12-21: 4 mg via INTRAVENOUS

## 2016-12-21 MED ORDER — OXYTOCIN 10 UNIT/ML IJ SOLN
INTRAMUSCULAR | Status: AC
  Filled 2016-12-21: qty 4

## 2016-12-21 MED ORDER — PRENATAL MULTIVITAMIN CH
1.0000 | ORAL_TABLET | Freq: Every day | ORAL | Status: DC
  Administered 2016-12-22: 1 via ORAL
  Filled 2016-12-21: qty 1

## 2016-12-21 MED ORDER — FENTANYL CITRATE (PF) 100 MCG/2ML IJ SOLN
INTRAMUSCULAR | Status: AC
  Filled 2016-12-21: qty 2

## 2016-12-21 MED ORDER — DIPHENHYDRAMINE HCL 50 MG/ML IJ SOLN: 12.5000 mg | INTRAMUSCULAR | Status: DC | PRN

## 2016-12-21 MED ORDER — PHENYLEPHRINE 8 MG IN D5W 100 ML (0.08MG/ML) PREMIX OPTIME
INJECTION | INTRAVENOUS | Status: DC | PRN
  Administered 2016-12-21: 60 ug/min via INTRAVENOUS

## 2016-12-21 MED ORDER — COCONUT OIL OIL: 1.0000 "application " | TOPICAL_OIL | Status: DC | PRN

## 2016-12-21 MED ORDER — BUPIVACAINE IN DEXTROSE 0.75-8.25 % IT SOLN
INTRATHECAL | Status: AC
Start: 2016-12-21 — End: 2016-12-21
  Filled 2016-12-21: qty 2

## 2016-12-21 MED ORDER — SODIUM CHLORIDE 0.9% FLUSH: 3.0000 mL | INTRAVENOUS | Status: DC | PRN

## 2016-12-21 MED ORDER — PHENYLEPHRINE 40 MCG/ML (10ML) SYRINGE FOR IV PUSH (FOR BLOOD PRESSURE SUPPORT)
PREFILLED_SYRINGE | INTRAVENOUS | Status: AC
  Filled 2016-12-21: qty 10

## 2016-12-21 MED ORDER — SODIUM CHLORIDE 0.9 % IR SOLN
Status: DC | PRN
  Administered 2016-12-21: 1000 mL

## 2016-12-21 MED ORDER — DIPHENHYDRAMINE HCL 25 MG PO CAPS: 25.0000 mg | ORAL_CAPSULE | Freq: Four times a day (QID) | ORAL | Status: DC | PRN

## 2016-12-21 MED ORDER — FENTANYL CITRATE (PF) 100 MCG/2ML IJ SOLN
INTRAMUSCULAR | Status: DC | PRN
Start: 2016-12-21 — End: 2016-12-21
  Administered 2016-12-21: 12.5 ug via INTRATHECAL

## 2016-12-21 MED ORDER — OXYTOCIN 40 UNITS IN LACTATED RINGERS INFUSION - SIMPLE MED: 2.5000 [IU]/h | INTRAVENOUS | Status: AC

## 2016-12-21 SURGICAL SUPPLY — 26 items
BENZOIN TINCTURE PRP APPL 2/3 (GAUZE/BANDAGES/DRESSINGS) ×3 IMPLANT
CHLORAPREP W/TINT 26ML (MISCELLANEOUS) ×3 IMPLANT
CLAMP CORD UMBIL (MISCELLANEOUS) ×3 IMPLANT
CLOSURE WOUND 1/2 X4 (GAUZE/BANDAGES/DRESSINGS) ×1
DRSG OPSITE POSTOP 4X10 (GAUZE/BANDAGES/DRESSINGS) ×3 IMPLANT
ELECT REM PT RETURN 9FT ADLT (ELECTROSURGICAL) ×3
ELECTRODE REM PT RTRN 9FT ADLT (ELECTROSURGICAL) ×1 IMPLANT
GLOVE BIOGEL PI IND STRL 6.5 (GLOVE) ×1 IMPLANT
GLOVE BIOGEL PI IND STRL 7.0 (GLOVE) ×1 IMPLANT
GLOVE BIOGEL PI INDICATOR 6.5 (GLOVE) ×2
GLOVE BIOGEL PI INDICATOR 7.0 (GLOVE) ×2
GLOVE SURG SS PI 6.0 STRL IVOR (GLOVE) ×3 IMPLANT
GOWN STRL REUS W/TWL LRG LVL3 (GOWN DISPOSABLE) ×6 IMPLANT
NS IRRIG 1000ML POUR BTL (IV SOLUTION) ×3 IMPLANT
PACK C SECTION WH (CUSTOM PROCEDURE TRAY) ×3 IMPLANT
PAD ABD 8X7 1/2 STERILE (GAUZE/BANDAGES/DRESSINGS) ×3 IMPLANT
PAD OB MATERNITY 4.3X12.25 (PERSONAL CARE ITEMS) ×3 IMPLANT
PENCIL SMOKE EVAC W/HOLSTER (ELECTROSURGICAL) ×3 IMPLANT
RTRCTR C-SECT PINK 25CM LRG (MISCELLANEOUS) ×3 IMPLANT
SPONGE GAUZE 4X4 12PLY STER LF (GAUZE/BANDAGES/DRESSINGS) ×6 IMPLANT
STRIP CLOSURE SKIN 1/2X4 (GAUZE/BANDAGES/DRESSINGS) ×2 IMPLANT
SUT VIC AB 0 CT1 36 (SUTURE) ×12 IMPLANT
SUT VIC AB 4-0 KS 27 (SUTURE) ×3 IMPLANT
TAPE CLOTH SURG 4X10 WHT LF (GAUZE/BANDAGES/DRESSINGS) ×3 IMPLANT
TOWEL OR 17X24 6PK STRL BLUE (TOWEL DISPOSABLE) ×3 IMPLANT
TRAY FOLEY BAG SILVER LF 14FR (SET/KITS/TRAYS/PACK) ×3 IMPLANT

## 2016-12-21 NOTE — H&P (Signed)
Julia Santiago is a 26 y.o. female G2P1 at 522w5d presenting for scheduled elective repeat c-section. Patient with late onset to care at [id number] and previous cesarean section due to breech presentation.  OB History    Gravida Para Term Preterm AB Living   2 1 1     1    SAB TAB Ectopic Multiple Live Births         0 1     Past Medical History:  Diagnosis Date  . Asthma   . Depression    was on certaline for pp depression  . Gestational diabetes    Past Surgical History:  Procedure Laterality Date  . CESAREAN SECTION N/A 01/29/2015   Procedure: CESAREAN SECTION;  Surgeon: Brock Badharles A Harper, MD;  Location: [facility];  Service: Obstetrics;  Laterality: N/A;   Family History: family history includes Heart disease in her father and maternal grandfather; Stroke in her maternal grandmother. Social History:  reports that she quit smoking about 2 years ago. She has never used smokeless tobacco. She reports that she drinks alcohol. She reports that she does not use drugs.     Maternal Diabetes: Yes:  Diabetes Type:  Diet controlled Genetic Screening: Declined Maternal Ultrasounds/Referrals: Normal Fetal Ultrasounds or other Referrals:  None Maternal Substance Abuse:  No Significant Maternal Medications:  None Significant Maternal Lab Results:  None Other Comments:  None  ROS  See pertinent in HPI History   Height 5\' 7"  (1.702 m), weight 217 lb (98.4 kg), last menstrual period 04/08/2016, unknown if currently breastfeeding. Exam Physical Exam  GENERAL: Well-developed, well-nourished female in no acute distress.  HEENT: Normocephalic, atraumatic. Sclerae anicteric.  LUNGS: Clear to auscultation bilaterally.  HEART: Regular rate and rhythm. ABDOMEN: Soft, nontender, gravid PELVIC: Not indicated EXTREMITIES: No cyanosis, clubbing, or edema, 2+ distal pulses.  Prenatal labs: ABO, Rh: AB/Positive/-- (04/17 0940) Antibody: Negative (04/17 0940) Rubella: <0.90  (04/17 0940) RPR: Non Reactive (04/17 0940)  HBsAg: Negative (04/17 0940)  HIV:   NR GBS:     Assessment/Plan: 26 yo G2P1 at 702w5d here for elective repeat cesarean section - Risks, benefits and alternatives were explained including but not limited to risks of bleeding, infection and damage to adjacent organs. Patient verbalized understanding and all questions were answered   Julia Santiago 12/21/2016, 9:56 AM

## 2016-12-21 NOTE — Anesthesia Preprocedure Evaluation (Signed)
Anesthesia Evaluation  Patient identified by MRN, date of birth, ID band Patient awake    Reviewed: Allergy & Precautions, NPO status , Patient's Chart, lab work & pertinent test results  Airway Mallampati: II  TM Distance: >3 FB Neck ROM: Full    Dental no notable dental hx. (+) Caps   Pulmonary neg pulmonary ROS, former smoker,    Pulmonary exam normal breath sounds clear to auscultation       Cardiovascular negative cardio ROS Normal cardiovascular exam Rhythm:Regular Rate:Normal     Neuro/Psych negative neurological ROS  negative psych ROS   GI/Hepatic negative GI ROS, Neg liver ROS,   Endo/Other  negative endocrine ROSdiabetes  Renal/GU negative Renal ROS  negative genitourinary   Musculoskeletal negative musculoskeletal ROS (+)   Abdominal   Peds negative pediatric ROS (+)  Hematology negative hematology ROS (+)   Anesthesia Other Findings   Reproductive/Obstetrics (+) Pregnancy                             Anesthesia Physical  Anesthesia Plan  ASA: II  Anesthesia Plan: Spinal   Post-op Pain Management:    Induction:   PONV Risk Score and Plan:   Airway Management Planned: Natural Airway  Additional Equipment:   Intra-op Plan:   Post-operative Plan:   Informed Consent: I have reviewed the patients History and Physical, chart, labs and discussed the procedure including the risks, benefits and alternatives for the proposed anesthesia with the patient or authorized representative who has indicated his/her understanding and acceptance.     Dental advisory given  Plan Discussed with:   Anesthesia Plan Comments:         Anesthesia Quick Evaluation  

## 2016-12-21 NOTE — Anesthesia Procedure Notes (Signed)
Spinal  Patient location during procedure: OR Staffing Anesthesiologist: Kimetha Trulson Performed: anesthesiologist  Preanesthetic Checklist Completed: patient identified, site marked, surgical consent, pre-op evaluation, timeout performed, IV checked, risks and benefits discussed and monitors and equipment checked Spinal Block Patient position: sitting Prep: DuraPrep Patient monitoring: heart rate, continuous pulse ox and blood pressure Approach: midline Location: L4-5 Injection technique: single-shot Needle Needle type: Sprotte  Needle gauge: 24 G Needle length: 9 cm Additional Notes Expiration date of kit checked and confirmed. Patient tolerated procedure well, without complications.       

## 2016-12-21 NOTE — Op Note (Signed)
Julia Santiago PROCEDURE DATE: 12/21/2016  PREOPERATIVE DIAGNOSIS: Intrauterine pregnancy at  1162w5d weeks gestation; patient declines vag del attempt  POSTOPERATIVE DIAGNOSIS: The same  PROCEDURE:     Cesarean Section  SURGEON:  Dr. Catalina AntiguaPeggy Demone Lyles  ASSISTANT: none  INDICATIONS: Julia Santiago is a 26 y.o. [id number] at 6562w5d scheduled for cesarean section secondary to patient declines vag del attempt.  The risks of cesarean section discussed with the patient included but were not limited to: bleeding which may require transfusion or reoperation; infection which may require antibiotics; injury to bowel, bladder, ureters or other surrounding organs; injury to the fetus; need for additional procedures including hysterectomy in the event of a life-threatening hemorrhage; placental abnormalities wth subsequent pregnancies, incisional problems, thromboembolic phenomenon and other postoperative/anesthesia complications. The patient concurred with the proposed plan, giving informed written consent for the procedure.    FINDINGS:  Viable female infant in cephalic presentation.  Apgars 8 and 9.  Clear amniotic fluid.  Intact placenta, three vessel cord.  Normal uterus, fallopian tubes and ovaries bilaterally.  ANESTHESIA:    Spinal INTRAVENOUS FLUIDS:2200 ml ESTIMATED BLOOD LOSS: 700 ml URINE OUTPUT:  100 ml SPECIMENS: Placenta sent to L&D COMPLICATIONS: None immediate  PROCEDURE IN DETAIL:  The patient received intravenous antibiotics and had sequential compression devices applied to her lower extremities while in the preoperative area.  She was then taken to the operating room where anesthesia was induced and was found to be adequate. A foley catheter was placed into her bladder and attached to Julia Santiago gravity. She was then placed in a dorsal supine position with a leftward tilt, and prepped and draped in a sterile manner. After an adequate timeout was performed, a Pfannenstiel skin incision was  made with scalpel and carried through to the underlying layer of fascia. The fascia was incised in the midline and this incision was extended bilaterally using the Mayo scissors. Kocher clamps were applied to the superior aspect of the fascial incision and the underlying rectus muscles were dissected off bluntly. A similar process was carried out on the inferior aspect of the facial incision. The rectus muscles were separated in the midline bluntly and the peritoneum was entered bluntly. The Alexis self-retaining retractor was introduced into the abdominal cavity. Attention was turned to the lower uterine segment where a bladder flap was created, and a transverse hysterotomy was made with a scalpel and extended bilaterally bluntly. The infant was successfully delivered, and cord was clamped and cut and infant was handed over to awaiting neonatology team. Uterine massage was then administered and the placenta delivered intact with three-vessel cord. The uterus was cleared of clot and debris.  The hysterotomy was closed with 0 Vicryl in a running locked fashion, and an imbricating layer was also placed with a 0 Vicryl. Overall, excellent hemostasis was noted. The pelvis copiously irrigated and cleared of all clot and debris. Hemostasis was confirmed on all surfaces.  The peritoneum and the muscles were reapproximated using 0 vicryl interrupted stitches. The fascia was then closed using 0 Vicryl in a running fashion.  The subcutaneous layer was reapproximated with plain gut and the skin was closed in a subcuticular fashion using 3.0 Vicryl. The patient tolerated the procedure well. Sponge, lap, instrument and needle counts were correct x 2. She was taken to the recovery room in stable condition.    Julia Santiago ConstantMD  12/21/2016 1:02 PM

## 2016-12-21 NOTE — Anesthesia Postprocedure Evaluation (Signed)
Anesthesia Post Note  Patient: Julia Santiago  Procedure(s) Performed: Procedure(s) (LRB): REPEAT CESAREAN SECTION (N/A)     Patient location during evaluation: Mother Baby Anesthesia Type: Spinal Level of consciousness: awake and alert and oriented Pain management: pain level controlled Vital Signs Assessment: post-procedure vital signs reviewed and stable Respiratory status: spontaneous breathing and nonlabored ventilation Cardiovascular status: stable Postop Assessment: no headache, patient able to bend at knees, no backache, no signs of nausea or vomiting, spinal receding and adequate PO intake Anesthetic complications: no    Last Vitals:  Vitals:   12/21/16 1425 12/21/16 1530  BP: 106/61 (!) 106/58  Pulse: 61 64  Resp: 16 16  Temp:  36.1 C    Last Pain:  Vitals:   12/21/16 1530  TempSrc: Oral   Pain Goal:                 Land O'LakesMalinova,Maripaz Mullan Hristova

## 2016-12-21 NOTE — Transfer of Care (Signed)
Immediate Anesthesia Transfer of Care Note  Patient: Julia Santiago  Procedure(s) Performed: Procedure(s): REPEAT CESAREAN SECTION (N/A)  Patient Location: PACU  Anesthesia Type:Spinal  Level of Consciousness: awake, alert  and oriented  Airway & Oxygen Therapy: Patient Spontanous Breathing  Post-op Assessment: Report given to RN and Post -op Vital signs reviewed and stable  Post vital signs: Reviewed and stable  Last Vitals:  Vitals:   12/21/16 1028  BP: 127/74  Temp: 37.2 C    Last Pain:  Vitals:   12/21/16 1028  TempSrc: Oral         Complications: No apparent anesthesia complications

## 2016-12-22 LAB — CBC
HEMATOCRIT: 27.6 % — AB (ref 36.0–46.0)
Hemoglobin: 9.3 g/dL — ABNORMAL LOW (ref 12.0–15.0)
MCH: 29 pg (ref 26.0–34.0)
MCHC: 33.7 g/dL (ref 30.0–36.0)
MCV: 86 fL (ref 78.0–100.0)
PLATELETS: 259 10*3/uL (ref 150–400)
RBC: 3.21 MIL/uL — ABNORMAL LOW (ref 3.87–5.11)
RDW: 13.7 % (ref 11.5–15.5)
WBC: 9.9 10*3/uL (ref 4.0–10.5)

## 2016-12-22 LAB — SYPHILIS: RPR W/REFLEX TO RPR TITER AND TREPONEMAL ANTIBODIES, TRADITIONAL SCREENING AND DIAGNOSIS ALGORITHM: RPR Ser Ql: NONREACTIVE

## 2016-12-22 LAB — BIRTH TISSUE RECOVERY COLLECTION (PLACENTA DONATION)

## 2016-12-22 MED ORDER — SERTRALINE HCL 50 MG PO TABS
50.0000 mg | ORAL_TABLET | Freq: Every day | ORAL | Status: DC
  Administered 2016-12-22: 50 mg via ORAL
  Filled 2016-12-22 (×2): qty 1

## 2016-12-22 NOTE — Clinical Social Work Maternal (Signed)
CLINICAL SOCIAL WORK MATERNAL/CHILD NOTE  Patient Details  Name: Julia Santiago MRN: [id number] Date of Birth: 08/19/1990  Date:  12/22/2016  Clinical Social Worker Initiating Note:  Ferdinand Lango Iliza Blankenbeckler, MSW, LCSW-A  Date/ Time Initiated:  12/22/16/1304     Child's Name:  Julia Santiago    Legal Guardian:  Other (Comment) (Not established by court systeml MOB and FOB Eusebio Me) parent collectively )   Need for Interpreter:  None   Date of Referral:  12/21/16     Reason for Referral:  Current Substance Use/Substance Use During Pregnancy    Referral Source:  RN   Address:  [address] [place] [address]  Phone number:  [phone])   Household Members:  Significant Other, Minor Children ([place]haven DOB 01/29/15)   Natural Supports (not living in the home):  Parent, Friends, Extended Family   Professional Supports: None   Employment: Unemployed   Type of Work: Unemployed currently    Education:  Database administrator Resources:  [employer], Multimedia programmer   Other Resources:  Other (Comment) (Non Reported)   Cultural/Religious Considerations Which May Impact Care:  Per face sheet Holiness   Strengths:  Ability to meet basic needs , Pediatrician chosen , Compliance with medical plan , Home prepared for child  ([facility] )   Risk Factors/Current Problems:  Mental Health Concerns    Cognitive State:  Alert , Able to Concentrate , Goal Oriented , Insightful    Mood/Affect:  Calm , Comfortable , Interested    CSW Assessment: CSW met with MOB at bedside to complete assessment for consult regarding hx of depression and THC use. Upon this writers arrival, MOB was sitting in bed breast feeding baby while FOB was sitting on the couch. With MOB's permission, this writer explained role and reasoning for visit. MOB verbalized understanding and was welcoming. CSW inquired about MOB's THC use. MOB was fourth coming  noting she did utilize [facility]; however, was prior to pregnancy being confirmed. This Probation officer informed MOB that she had a (+) screen for THC on 09/05/16. MOB noted that was around the time she had just found out she was pregnant but notes she had quite a month prior to that date maybe even longer. CSW thanked MOB for being honest and fourth coming. CSW educated MOB on hospitals policy and procedure regarding THC use and mandated reporting for (+) screens in baby's. MOB verbalized understanding and inquired if someone would come take her baby away. This Probation officer informed MOB that babys UDS was (-); however; we are still awaiting his CDS. CSW informed MOB that if the CDS results are (+) then we will make a report and DSS in her county will complete an assessment of MOB and the home. CSW explained to MOB if there are no safety concerns found by DSS then case will likely close; especially, if she has no prior involvement. MOB verbalized understanding and felt better after brief explanation of process.   CSW inquired about MOB's hx of depression. 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 She noted she was previously prescribed Zoloft and felt it helped a lot. MOB noted she is interested in starting that again currently. This Probation officer contacted patients RN Inez Catalina, and informed her of MOB's desire to start on Zoloft again. Inez Catalina noted she would contact patients MD and make them aware. This Probation officer discussed PPD, safe sleeping/SIDS. MOB  verbalized understanding noting she was knowledgeable of both subjects.   At this time, no other needs were expressed or requested. CSW will continue to follow pending CDS results. CSW has no barriers to d/c.   CSW Plan/Description:  No Further Intervention Required/No Barriers to Discharge, Patient/Family Education , Information/Referral to [employer] , Other (Comment) (CSW will continue to follow pending CDS  results )    Water quality scientist, MSW, [facility]  Office: [phone]

## 2016-12-22 NOTE — Progress Notes (Signed)
POSTOPERATIVE DAY # 1 S/P rLTCS   S:         Reports feeling welll             Tolerating po intake / no nausea / no vomiting / yes  flatus /  Bleeding is moderate             Pain controlled withibuprofen (OTC)             Up ad lib / ambulatory/ voiding QS  Newborn breast feeding  / Circumcision : planning for outpatient  Successfully breastfeeding? yes    O:  VS: BP (!) 108/52   Pulse 64   Temp 98.2 F (36.8 C)   Resp 18   Ht 5\' 7"  (1.702 m)   Wt 217 lb (98.4 kg)   LMP 04/08/2016 (Approximate)   SpO2 100%   Breastfeeding? Unknown   BMI 33.99 kg/m    LABS:               Recent Labs  12/21/16 1045 12/22/16 0558  WBC 8.8 9.9  HGB 10.2* 9.3*  PLT 273 259               Bloodtype: --/--/AB POS (06/15 1048)  Rubella: <0.90 (04/17 0940)                     EBL:                          I&O: Intake/Output      06/15 0701 - 06/16 0700 06/16 0701 - 06/17 0700   P.O. 240    I.V. (mL/kg) 2731.3 (27.8)    Total Intake(mL/kg) 2971.3 (30.2)    Urine (mL/kg/hr) 1725 1200 (2)   Blood 700    Total Output 2425 1200   Net +546.3 -1200                     Physical Exam:             Alert and Oriented X3  Lungs: Clear and unlabored  Heart: regular rate and rhythm / no mumurs  Abdomen: soft, non-tender, non-distended -1             Fundus: firm, non-tender, U-1             Dressing honeycomb  Lochia: mild  Extremities: no edema, no calf pain or tenderness   Reason for C/S? Repeat   A:        POD # 1 S/P rLTCS            Doing well  P:        Routine postoperative care              [facility]  Anticipate discharge on 6/17    Charlesetta GaribaldiKathryn Lorraine Kooistra CNM 12/22/2016, 1:15 PM

## 2016-12-22 NOTE — Lactation Note (Signed)
This note was copied from a baby's chart. Lactation Consultation Note  Patient Name: Boy Letina Hyer Today's Date: 12/22/2016 Reason for consult: Initial assessment Breastfeeding consultation services and support information given and reviewed.  This is mom's second baby and newborn is 2121 hours old.  Mom states baby latches easily but doesn't always open mouth wide.  Recommended skin to skin and good waking techniques prior to latching.  Observed mom independently latch baby with deep latch obtained.  Instructed to feed with any feeding cue and call for assist prn.  Maternal Data Has patient been taught Hand Expression?: Yes Does the patient have breastfeeding experience prior to this delivery?: Yes  Feeding Feeding Type: Breast Fed Length of feed: 20 min  LATCH Score/Interventions Latch: Grasps breast easily, tongue down, lips flanged, rhythmical sucking. Intervention(s): Breast compression  Audible Swallowing: A few with stimulation Intervention(s): Skin to skin;Hand expression Intervention(s): Alternate breast massage  Type of Nipple: Everted at rest and after stimulation  Comfort (Breast/Nipple): Soft / non-tender     Hold (Positioning): No assistance needed to correctly position infant at breast. Intervention(s): Breastfeeding basics reviewed;Support Pillows;Position options;Skin to skin  LATCH Score: 9  Lactation Tools Discussed/Used     Consult Status Consult Status: Follow-up Date: 12/23/16 Follow-up type: In-patient    Huston FoleyMOULDEN, Javarius Tsosie S 12/22/2016, 10:26 AM

## 2016-12-23 DIAGNOSIS — F53 Postpartum depression: Secondary | ICD-10-CM

## 2016-12-23 DIAGNOSIS — O99345 Other mental disorders complicating the puerperium: Secondary | ICD-10-CM | POA: Diagnosis not present

## 2016-12-23 LAB — STREP GP B CULTURE+RFLX: Strep Gp B Culture+Rflx: NEGATIVE

## 2016-12-23 MED ORDER — FLUCONAZOLE 150 MG PO TABS: 150.0000 mg | ORAL_TABLET | Freq: Once | ORAL | 3 refills | Status: AC

## 2016-12-23 MED ORDER — OXYCODONE HCL 5 MG PO TABS: 5.0000 mg | ORAL_TABLET | ORAL | 0 refills | Status: DC | PRN

## 2016-12-23 MED ORDER — MEASLES, MUMPS & RUBELLA VAC ~~LOC~~ INJ
0.5000 mL | INJECTION | Freq: Once | SUBCUTANEOUS | Status: AC
  Administered 2016-12-23: 0.5 mL via SUBCUTANEOUS
  Filled 2016-12-23 (×2): qty 0.5

## 2016-12-23 MED ORDER — IBUPROFEN 600 MG PO TABS: 600.0000 mg | ORAL_TABLET | Freq: Four times a day (QID) | ORAL | 2 refills | Status: DC

## 2016-12-23 MED ORDER — SERTRALINE HCL 50 MG PO TABS: 50.0000 mg | ORAL_TABLET | Freq: Every day | ORAL | 5 refills | Status: DC

## 2016-12-23 MED ORDER — DOCUSATE SODIUM 100 MG PO CAPS: 100.0000 mg | ORAL_CAPSULE | Freq: Two times a day (BID) | ORAL | 2 refills | Status: DC | PRN

## 2016-12-23 NOTE — Discharge Instructions (Signed)
Cesarean Delivery, Care After Refer to this sheet in the next few weeks. These instructions provide you with information about caring for yourself after your procedure. Your health care provider may also give you more specific instructions. Your treatment has been planned according to current medical practices, but problems sometimes occur. Call your health care provider if you have any problems or questions after your procedure. What can I expect after the procedure? After the procedure, it is common to have:  A small amount of blood or clear fluid coming from the incision.  Some redness, swelling, and pain in your incision area.  Some abdominal pain and soreness.  Vaginal bleeding (lochia).  Pelvic cramps.  Fatigue.  Follow these instructions at home: Incision care   Follow instructions from your health care provider about how to take care of your incision. Make sure you: ? Wash your hands with soap and water before you change your bandage (dressing). If soap and water are not available, use hand sanitizer. ? Change your dressing as told by your health care provider. ? Leave stitches (sutures), skin staples, skin glue, or adhesive strips in place. These skin closures may need to stay in place for 2 weeks or longer. If adhesive strip edges start to loosen and curl up, you may trim the loose edges. Do not remove adhesive strips completely unless your health care provider tells you to do that.  Check your incision area every day for signs of infection. Check for: ? More redness, swelling, or pain. ? More fluid or blood. ? Warmth. ? Pus or a bad smell.  When you cough or sneeze, hug a pillow. This helps with pain and decreases the chance of your incision opening up (dehiscing). Do this until your incision heals. Medicines  Take over-the-counter and prescription medicines only as told by your health care provider.  If you were prescribed an antibiotic medicine, take it as told by  your health care provider. Do not stop taking the antibiotic until it is finished. Driving  Do not drive or operate heavy machinery while taking prescription pain medicine.  Do not drive for 24 hours if you received a sedative. Lifestyle  Do not drink alcohol. This is especially important if you are breastfeeding or taking pain medicine.  Do not use tobacco products, including cigarettes, chewing tobacco, or e-cigarettes. If you need help quitting, ask your health care provider. Tobacco can delay wound healing. Eating and drinking  Drink at least 8 eight-ounce glasses of water every day unless told not to by your health care provider. If you breastfeed, you may need to drink more water than this.  Eat high-fiber foods every day. These foods may help prevent or relieve constipation. High-fiber foods include: ? Whole grain cereals and breads. ? Brown rice. ? Beans. ? Fresh fruits and vegetables. Activity  Return to your normal activities as told by your health care provider. Ask your health care provider what activities are safe for you.  Rest as much as possible. Try to rest or take a nap while your baby is sleeping.  Do not lift anything that is heavier than your baby or 10 lb (4.5 kg) as told by your health care provider.  Ask your health care provider when you can engage in sexual activity. This may depend on your: ? Risk of infection. ? Healing rate. ? Comfort and desire to engage in sexual activity. Bathing  Do not take baths, swim, or use a hot tub until your health care  provider approves. Ask your health care provider if you can take showers. You may only be allowed to take sponge baths until your incision heals.  Keep your dressing dry as told by your health care provider. General instructions  Do not use tampons or douches until your health care provider approves.  Wear: ? Loose, comfortable clothing. ? A supportive and well-fitting bra.  Watch for any blood clots  that may pass from your vagina. These may look like clumps of dark red, brown, or black discharge.  Keep your perineum clean and dry as told by your health care provider.  Wipe from front to back when you use the toilet.  If possible, have someone help you care for your baby and help with household activities for a few days after you leave the hospital.  Keep all follow-up visits for you and your baby as told by your health care provider. This is important. Contact a health care provider if:  You have: ? Bad-smelling vaginal discharge. ? Difficulty urinating. ? Pain when urinating. ? A sudden increase or decrease in the frequency of your bowel movements. ? More redness, swelling, or pain around your incision. ? More fluid or blood coming from your incision. ? Pus or a bad smell coming from your incision. ? A fever. ? A rash. ? Little or no interest in activities you used to enjoy. ? Questions about caring for yourself or your baby. ? Nausea.  Your incision feels warm to the touch.  Your breasts turn red or become painful or hard.  You feel unusually sad or worried.  You vomit.  You pass large blood clots from your vagina. If you pass a blood clot, save it to show to your health care provider. Do not flush blood clots down the toilet without showing your health care provider.  You urinate more than usual.  You are dizzy or light-headed.  You have not breastfed and have not had a menstrual period for 12 weeks after delivery.  You stopped breastfeeding and have not had a menstrual period for 12 weeks after stopping breastfeeding. Get help right away if:  You have: ? Pain that does not go away or get better with medicine. ? Chest pain. ? Difficulty breathing. ? Blurred vision or spots in your vision. ? Thoughts about hurting yourself or your baby. ? New pain in your abdomen or in one of your legs. ? A severe headache.  You faint.  You bleed from your vagina so much  that you fill two sanitary pads in one hour. This information is not intended to replace advice given to you by your health care provider. Make sure you discuss any questions you have with your health care provider. Document Released: 03/17/2002 Document Revised: 11/03/2015 Document Reviewed: 05/30/2015 Elsevier Interactive Patient Education  2017 [employer]  Iron-Rich Diet  Iron is a mineral that helps your body to produce hemoglobin. Hemoglobin is a protein in your red blood cells that carries oxygen to your body's tissues. Eating too little iron may cause you to feel weak and tired, and it can increase your risk for infection. Eating enough iron is necessary for your body's metabolism, muscle function, and nervous system. Iron is naturally found in many foods. It can also be added to foods or fortified in foods. There are two types of dietary iron:  Heme iron. Heme iron is absorbed by the body more easily than nonheme iron. Heme iron is found in meat, poultry, and fish.  Nonheme iron. Nonheme iron is found in dietary supplements, iron-fortified grains, beans, and vegetables.  You may need to follow an iron-rich diet if:  You have been diagnosed with iron deficiency or iron-deficiency anemia.  You have a condition that prevents you from absorbing dietary iron, such as: ? Infection in your intestines. ? Celiac disease. This involves long-lasting (chronic) inflammation of your intestines.  You do not eat enough iron.  You eat a diet that is high in foods that impair iron absorption.  You have lost a lot of blood.  You have heavy bleeding during your menstrual cycle.  You are pregnant.  What is my plan? Your health care provider may help you to determine how much iron you need per day based on your condition. Generally, when a person consumes sufficient amounts of iron in the diet, the following iron needs are met:  Men. ? 22-35 years old: 11 mg per day. ? 77-60 years old: 8  mg per day.  Women. ? 10-84 years old: 15 mg per day. ? 98-46 years old: 18 mg per day. ? Over 13 years old: 8 mg per day. ? Pregnant women: 27 mg per day. ? Breastfeeding women: 9 mg per day.  What do I need to know about an iron-rich diet?  Eat fresh fruits and vegetables that are high in vitamin C along with foods that are high in iron. This will help increase the amount of iron that your body absorbs from food, especially with foods containing nonheme iron. Foods that are high in vitamin C include oranges, peppers, tomatoes, and mango.  Take iron supplements only as directed by your health care provider. Overdose of iron can be life-threatening. If you were prescribed iron supplements, take them with orange juice or a vitamin C supplement.  Cook foods in pots and pans that are made from iron.  Eat nonheme iron-containing foods alongside foods that are high in heme iron. This helps to improve your iron absorption.  Certain foods and drinks contain compounds that impair iron absorption. Avoid eating these foods in the same meal as iron-rich foods or with iron supplements. These include: ? Coffee, black tea, and red wine. ? Milk, dairy products, and foods that are high in calcium. ? Beans, soybeans, and peas. ? Whole grains.  When eating foods that contain both nonheme iron and compounds that impair iron absorption, follow these tips to absorb iron better. ? Soak beans overnight before cooking. ? Soak whole grains overnight and drain them before using. ? Ferment flours before baking, such as using yeast in bread dough. What foods can I eat? Grains Iron-fortified breakfast cereal. Iron-fortified whole-wheat bread. Enriched rice. Sprouted grains. Vegetables Spinach. Potatoes with skin. Green peas. Broccoli. Red and green bell peppers. Fermented vegetables. Fruits Prunes. Raisins. Oranges. Strawberries. Mango. Grapefruit. Meats and Other Protein Sources Beef liver. Oysters. Beef.  Shrimp. [place]. Chicken. [place]. Sardines. Chickpeas. Nuts. Tofu. Beverages Tomato juice. Fresh orange juice. Prune juice. Hibiscus tea. Fortified instant breakfast shakes. Condiments Tahini. Fermented soy sauce. Sweets and Desserts Black-strap molasses. Other Wheat germ. The items listed above may not be a complete list of recommended foods or beverages. Contact your dietitian for more options. What foods are not recommended? Grains Whole grains. Bran cereal. Bran flour. Oats. Vegetables Artichokes. Brussels sprouts. Kale. Fruits Blueberries. Raspberries. Strawberries. Figs. Meats and Other Protein Sources Soybeans. Products made from soy protein. Dairy Milk. Cream. Cheese. Yogurt. Cottage cheese. Beverages Coffee. Black tea. Red wine. Sweets and Desserts Cocoa. Chocolate.  Ice cream. Other Basil. Oregano. Parsley. The items listed above may not be a complete list of foods and beverages to avoid. Contact your dietitian for more information. This information is not intended to replace advice given to you by your health care provider. Make sure you discuss any questions you have with your health care provider. Document Released: 02/06/2005 Document Revised: 01/13/2016 Document Reviewed: 01/20/2014 Elsevier Interactive Patient Education  [employer].

## 2016-12-23 NOTE — Discharge Summary (Addendum)
  OB Discharge Summary     Patient Name: Julia Santiago DOB: 06/07/1991 MRN: [id number]  Date of admission: 12/21/2016 Delivering MD: CONSTANT, PEGGY   Date of discharge: 12/23/2016  Admitting diagnosis: RCS Intrauterine pregnancy: [redacted]w[redacted]d     Secondary diagnosis:  Active Problems:   Previous cesarean delivery, antepartum condition or complication   Pregnancy, supervision, high-risk   Gestational diabetes mellitus (GDM) affecting pregnancy, antepartum   S/P cesarean section   Depression affecting pregnancy, postpartum  Additional problems: None     Discharge diagnosis: Term Pregnancy Delivered, GDM A1 and Postpartum depression                                                                                                Post partum procedures:None  Complications: None  Hospital course:  Scheduled C/S   26 y.o. yo G2P2002 at [redacted]w[redacted]d was admitted to the hospital 12/21/2016 for scheduled cesarean section with the following indication:Elective Repeat.  Membrane Rupture Time/Date: 12:26 PM ,12/21/2016   Patient delivered a Viable infant.12/21/2016  Details of operation can be found in separate operative note.  Pateint had an uncomplicated postpartum course. Was started on Zoloft for history of postpartum depression.  She is ambulating, tolerating a regular diet, passing flatus, and urinating well. Patient is discharged home in stable condition on  12/23/16         Physical exam  Vitals:   12/22/16 1315 12/22/16 1524 12/22/16 1839 12/23/16 0532  BP: 114/63 (!) 107/55 (!) 123/59 120/73  Pulse: 64 68 68 68  Resp: 18 20 16 18  Temp: 98.3 F (36.8 C)  98.3 F (36.8 C) 98.1 F (36.7 C)  TempSrc:   Oral Oral  SpO2: 100%  99%   Weight:      Height:       General: alert, cooperative and no distress Lochia: appropriate Uterine Fundus: firm Incision: Healing well with no significant drainage DVT Evaluation: No evidence of DVT seen on physical exam. Negative Homan's sign. Labs: Lab  Results  Component Value Date   WBC 9.9 12/22/2016   HGB 9.3 (L) 12/22/2016   HCT 27.6 (L) 12/22/2016   MCV 86.0 12/22/2016   PLT 259 12/22/2016   No flowsheet data found.  Discharge instruction: per After Visit Summary and "Baby and Me Booklet".  After visit meds:  Allergies as of 12/23/2016      Reactions   Penicillins    Has patient had a PCN reaction causing immediate rash, facial/tongue/throat swelling, SOB or lightheadedness with hypotension: No Has patient had a PCN reaction causing severe rash involving mucus membranes or skin necrosis: No Has patient had a PCN reaction that required hospitalization: No Has patient had a PCN reaction occurring within the last 10 years: No If all of the above answers are "NO", then may proceed with Cephalosporin use. As a child-reaction unknown      Medication List    STOP taking these medications   ACCU-CHEK FASTCLIX LANCETS Misc   blood glucose meter kit and supplies Kit   CONCEPT OB 130-92.4-1 MG Caps   glucose blood test strip Commonly   known as:  ACCU-CHEK AVIVA PLUS   metroNIDAZOLE 500 MG tablet Commonly known as:  FLAGYL   terconazole 0.4 % vaginal cream Commonly known as:  TERAZOL 7     TAKE these medications   docusate sodium 100 MG capsule Commonly known as:  COLACE Take 1 capsule (100 mg total) by mouth 2 (two) times daily as needed.   fluconazole 150 MG tablet Commonly known as:  DIFLUCAN Take 1 tablet (150 mg total) by mouth once. Can take additional dose three days later if symptoms persist   ibuprofen 600 MG tablet Commonly known as:  ADVIL,MOTRIN Take 1 tablet (600 mg total) by mouth every 6 (six) hours.   oxyCODONE 5 MG immediate release tablet Commonly known as:  Oxy IR/ROXICODONE Take 1 tablet (5 mg total) by mouth every 4 (four) hours as needed (pain scale 4-7).   Prenatal Vitamins 28-0.8 MG Tabs Take 1 tablet by mouth daily.   sertraline 50 MG tablet Commonly known as:  ZOLOFT Take 1 tablet  (50 mg total) by mouth daily.       Diet: routine diet  Activity: Advance as tolerated. Pelvic rest for 6 weeks.   Outpatient follow up:2 and 6 weeks 2 weeks: Check up incision and postpartum depression check in.  6 weeks: Postpartum visit with any provider, 2 hr postpartum GTT (needs to be fasting).  Postpartum contraception: Nexplanon  Newborn Data: Live born female  Birth Weight: 8 lb 2 oz (3685 g) APGAR: 8, 9  Baby Feeding: Breast Disposition:home with mother   12/23/2016  , MD   

## 2016-12-24 LAB — GLUCOSE, CAPILLARY: Glucose-Capillary: 79 mg/dL (ref 65–99)

## 2017-01-07 ENCOUNTER — Ambulatory Visit: Payer: Self-pay

## 2017-01-16 ENCOUNTER — Encounter: Payer: Self-pay | Admitting: Obstetrics and Gynecology

## 2017-01-30 ENCOUNTER — Telehealth: Payer: Self-pay | Admitting: Advanced Practice Midwife

## 2017-01-30 ENCOUNTER — Ambulatory Visit: Payer: Self-pay | Admitting: Advanced Practice Midwife

## 2017-01-30 NOTE — Telephone Encounter (Signed)
Talked with patient due to missed postpartum appointment. Rescheduled for next week, also reminded patient of 2 hr. Patient confirmed appointment.

## 2017-02-06 ENCOUNTER — Ambulatory Visit: Payer: Self-pay | Admitting: Advanced Practice Midwife

## 2017-02-06 ENCOUNTER — Encounter: Payer: Self-pay | Admitting: Family Medicine

## 2017-02-06 NOTE — Progress Notes (Signed)
Per Thressa ShellerHeather Hogan, CNM pt does not need to be contacted in regards to missed appt.

## 2018-03-10 ENCOUNTER — Encounter (HOSPITAL_COMMUNITY): Payer: Self-pay

## 2018-03-10 ENCOUNTER — Ambulatory Visit (HOSPITAL_COMMUNITY)
Admission: EM | Admit: 2018-03-10 | Discharge: 2018-03-10 | Disposition: A | Discharge status: Home / Self Care | Payer: BLUE CROSS/BLUE SHIELD | Attending: Family Medicine | Admitting: Family Medicine

## 2018-03-10 DIAGNOSIS — Z87891 Personal history of nicotine dependence: Secondary | ICD-10-CM | POA: Insufficient documentation

## 2018-03-10 DIAGNOSIS — W108XXA Fall (on) (from) other stairs and steps, initial encounter: Secondary | ICD-10-CM

## 2018-03-10 DIAGNOSIS — J029 Acute pharyngitis, unspecified: Secondary | ICD-10-CM

## 2018-03-10 DIAGNOSIS — Z79899 Other long term (current) drug therapy: Secondary | ICD-10-CM | POA: Insufficient documentation

## 2018-03-10 DIAGNOSIS — N898 Other specified noninflammatory disorders of vagina: Secondary | ICD-10-CM | POA: Insufficient documentation

## 2018-03-10 DIAGNOSIS — M25561 Pain in right knee: Secondary | ICD-10-CM | POA: Insufficient documentation

## 2018-03-10 LAB — POCT RAPID STREP A: STREPTOCOCCUS, GROUP A SCREEN (DIRECT): NEGATIVE

## 2018-03-10 MED ORDER — AZITHROMYCIN 250 MG PO TABS
ORAL_TABLET | ORAL | Status: AC
Start: 2018-03-10 — End: 2018-03-10
  Filled 2018-03-10: qty 4

## 2018-03-10 MED ORDER — AZITHROMYCIN 250 MG PO TABS
1000.0000 mg | ORAL_TABLET | Freq: Once | ORAL | Status: AC
  Administered 2018-03-10: 1000 mg via ORAL

## 2018-03-10 MED ORDER — IBUPROFEN 600 MG PO TABS: 600.0000 mg | ORAL_TABLET | Freq: Four times a day (QID) | ORAL | 0 refills | Status: AC | PRN

## 2018-03-10 MED ORDER — GENTAMICIN SULFATE 40 MG/ML IJ SOLN
240.0000 mg | Freq: Once | INTRAMUSCULAR | Status: AC
  Administered 2018-03-10: 240 mg via INTRAMUSCULAR

## 2018-03-10 NOTE — ED Provider Notes (Addendum)
[facility]    CSN: [id number] Arrival date & time: 03/10/18  4098     History   Chief Complaint Chief Complaint  Patient presents with  . Sore Throat  . Knee Pain    Right Knee  . Vaginal Discharge    HPI Julia Santiago is a 27 y.o. female.   The history is provided by the patient. No language interpreter was used.  Sore Throat  This is a new problem. The current episode started more than 2 days ago (Started after an oral sex and vaginal sex. She used condom for the vaginal sex but not for the oral sex. Uncertain if her partner has STD). The problem occurs constantly. The problem has not changed since onset.Associated symptoms include abdominal pain. Associated symptoms comments: LMP was 2 weeks ago. Uses condom for birth control. First sexual intercourse in over a year was 3 days ago and she used condom.. Nothing aggravates the symptoms. Nothing relieves the symptoms.  Knee Pain  Location:  Knee Injury: yes (Fell down the stairs 1 week ago and landed on her right knee)   Knee location:  R knee Pain details:    Quality:  Aching   Radiates to:  Does not radiate   Severity:  Mild   Onset quality:  Gradual   Timing:  Intermittent   Progression:  Improving Chronicity:  New Dislocation: no   Relieved by:  NSAIDs and rest Worsened by:  Bearing weight Associated symptoms: no back pain, no decreased ROM, no fatigue, no fever, no numbness and no swelling   Risk factors comment:  Recent fall Vaginal Discharge  Quality:  Dark and gray Severity:  Moderate Onset quality:  Gradual Duration:  3 days Timing:  Constant Progression:  Unchanged Chronicity:  New Context: after intercourse   Relieved by:  Nothing Worsened by:  Nothing Ineffective treatments:  None tried Associated symptoms: abdominal pain   Associated symptoms: no dysuria, no fever, no nausea, no vaginal itching and no vomiting   Associated symptoms comment:  Occasional abdominal cramp. Currently no  pain   Past Medical History:  Diagnosis Date  . Asthma   . Depression    was on certaline for pp depression  . Gestational diabetes     Patient Active Problem List   Diagnosis Date Noted  . Depression affecting pregnancy, postpartum 12/23/2016  . S/P cesarean section 12/21/2016  . Insufficient prenatal care 12/19/2016  . Gestational diabetes mellitus (GDM) affecting pregnancy, antepartum 11/14/2016  . Pregnancy, supervision, high-risk 10/08/2016  . Ultrasound scan abnormal 10/08/2016  . Previous cesarean delivery, antepartum condition or complication 01/29/2015    Past Surgical History:  Procedure Laterality Date  . CESAREAN SECTION N/A 01/29/2015   Procedure: CESAREAN SECTION;  Surgeon: Brock Bad, MD;  Location: [facility];  Service: Obstetrics;  Laterality: N/A;  . CESAREAN SECTION N/A 12/21/2016   Procedure: REPEAT CESAREAN SECTION;  Surgeon: Catalina Antigua, MD;  Location: [facility];  Service: Obstetrics;  Laterality: N/A;    OB History    Gravida  2   Para  2   Term  2   Preterm      AB      Living  2     SAB      TAB      Ectopic      Multiple  0   Live Births  2            Home Medications    Prior  to Admission medications   Medication Sig Start Date End Date Taking? Authorizing Provider  docusate sodium (COLACE) 100 MG capsule Take 1 capsule (100 mg total) by mouth 2 (two) times daily as needed. 12/23/16   Anyanwu, Jethro Bastos, MD  ibuprofen (ADVIL,MOTRIN) 600 MG tablet Take 1 tablet (600 mg total) by mouth every 6 (six) hours. 12/23/16   Anyanwu, Jethro Bastos, MD  oxyCODONE (OXY IR/ROXICODONE) 5 MG immediate release tablet Take 1 tablet (5 mg total) by mouth every 4 (four) hours as needed (pain scale 4-7). 12/23/16   Anyanwu, Jethro Bastos, MD  Prenatal Vit-Fe Fumarate-FA (PRENATAL VITAMINS) 28-0.8 MG TABS Take 1 tablet by mouth daily. Patient not taking: Reported on 12/20/2016 09/05/16   Rasch, Victorino Dike I, NP  sertraline (ZOLOFT) 50 MG tablet  Take 1 tablet (50 mg total) by mouth daily. 12/23/16   Tereso Newcomer, MD    Family History Family History  Problem Relation Age of Onset  . Heart disease Father   . Stroke Maternal Grandmother   . Heart disease Maternal Grandfather     Social History Social History   Tobacco Use  . Smoking status: Former Smoker    Last attempt to quit: 05/11/2014    Years since quitting: 3.8  . Smokeless tobacco: Never Used  Substance Use Topics  . Alcohol use: Yes    Alcohol/week: 0.0 standard drinks    Comment: social use  . Drug use: No     Allergies   Penicillins   Review of Systems Review of Systems  Constitutional: Negative for fatigue and fever.  Respiratory: Negative.   Cardiovascular: Negative.   Gastrointestinal: Positive for abdominal pain. Negative for nausea and vomiting.  Genitourinary: Positive for vaginal discharge. Negative for dysuria.  Musculoskeletal: Negative for back pain.       Right knee pain  All other systems reviewed and are negative.    Physical Exam Triage Vital Signs ED Triage Vitals [03/10/18 0908]  Enc Vitals Group     BP 130/89     Pulse Rate 82     Resp 20     Temp 98 F (36.7 C)     Temp Source Oral     SpO2 100 %     Weight      Height      Head Circumference      Peak Flow      Pain Score 8     Pain Loc      Pain Edu?      Excl. in GC?    No data found.  Updated Vital Signs BP 130/89 (BP Location: Left Arm)   Pulse 82   Temp 98 F (36.7 C) (Oral)   Resp 20   LMP 02/24/2018   SpO2 100%   Visual Acuity Right Eye Distance:   Left Eye Distance:   Bilateral Distance:    Right Eye Near:   Left Eye Near:    Bilateral Near:     Physical Exam  Constitutional: She appears well-developed. She does not appear ill.  HENT:  Right Ear: Tympanic membrane and ear canal normal. No drainage or tenderness.  Left Ear: Tympanic membrane and ear canal normal. No drainage or tenderness.  Mouth/Throat: Oropharynx is clear and moist  and mucous membranes are normal. Mucous membranes are not pale. No oral lesions. No uvula swelling. No oropharyngeal exudate, posterior oropharyngeal edema, posterior oropharyngeal erythema or tonsillar abscesses.  Eyes: Pupils are equal, round, and reactive to light.  Cardiovascular: Normal  rate, regular rhythm and normal heart sounds.  No murmur heard. Pulmonary/Chest: Effort normal and breath sounds normal. No respiratory distress. She has no wheezes. She has no rhonchi.  Abdominal: Soft. She exhibits no distension and no mass. There is no tenderness. There is no rebound and no guarding.  Genitourinary: Vagina normal and uterus normal. Pelvic exam was performed with patient supine. There is no rash, tenderness or lesion on the right labia. There is no rash, tenderness or lesion on the left labia. Cervix exhibits no motion tenderness. Right adnexum displays no tenderness. Left adnexum displays no mass and no tenderness.  Musculoskeletal:       Right knee: She exhibits normal range of motion, no swelling and no deformity. No tenderness found.       Left knee: Normal.  Lymphadenopathy:    She has no cervical adenopathy.  Nursing note and vitals reviewed.    UC Treatments / Results  Labs (all labs ordered are listed, but only abnormal results are displayed) Labs Reviewed - No data to display  EKG None  Radiology No results found.  Procedures Procedures (including critical care time)  Medications Ordered in UC Medications - No data to display  Initial Impression / Assessment and Plan / UC Course  I have reviewed the triage vital signs and the nursing notes.  Pertinent labs & imaging results that were available during my care of the patient were reviewed by me and considered in my medical decision making (see chart for details).  Clinical Course as of Mar 11 1650  Mon Mar 10, 2018  1610 Knee exam benign. Likely pain from sprain. Continue wearing brace at home. Ibuprofen 600 mg  prn pain. Return soon if no improvement.  Requesting new brace since the one she wears now belongs to someone else. Brace given.   [KE]  [id number] GU exam looks fine. GC/Chlamydia, BV, Trich, Yeast checked.  Oral swab completed for strep and GC/Chlamydia and Trich. Neg strep throat. Treat empirically for GC/Chlamydia. She has severe penicillin allergy hence Gentamicin and Zithromax given. I gave her 1 g Zithro instead of two grams which is high dose more than daily max dose. Caution due to previous med allergy hx. If result comes back positive, will contact for test of cure and likely treat with 2 grams Azithromycin. HIV and RPR checked as well. I will contact with result.  Safe sex discussed.  Note: I recommended either holding off on treatment till the result is available vs treating during visit. She prefers empirical treatment for Gonorrhea and Chlamydia while awaiting test results.   [KE]    Clinical Course User Index [KE] Julia Eland, MD    Acute pharyngitis, unspecified etiology  Vaginal discharge  Acute pain of right knee   Final Clinical Impressions(s) / UC Diagnoses   Final diagnoses:  None   Discharge Instructions   None    ED Prescriptions    None     Controlled Substance Prescriptions [place] Controlled Substance Registry consulted? Not Applicable   More than 50% of this 50 minutes face to face visit was spent on counseling, evaluation and coordination of care. Gentamicin not available here. Pharmacy contacted to get medication in for the patient.   Julia Eland, MD 03/10/18 1025    Julia Eland, MD 03/11/18 1651

## 2018-03-10 NOTE — ED Notes (Signed)
Notified pharmacy order for gentamycin

## 2018-03-10 NOTE — Discharge Instructions (Signed)
It was nice seeing you today. Your strep test is negative. We have checked you for Gonorrhea and Chlamydia. Once I get the result I will call you. Due to sexual exposure, we decided to treat you today. Please use protection regularly to prevent STD. See PCP soon.

## 2018-03-10 NOTE — ED Triage Notes (Signed)
Pt presents with sore throat and chest pressure/ache from frequent cough.  Pt also complains of right knee pain from a fall a few days ago.  Pt also presents with vaginal discharge that is a greyish-Jamy Cleckler; states no odor or itch.

## 2018-03-10 NOTE — ED Notes (Signed)
Patient access is getting medicine from pharmacy

## 2018-03-11 LAB — CERVICOVAGINAL ANCILLARY ONLY
Bacterial vaginitis: NEGATIVE
CANDIDA VAGINITIS: NEGATIVE
CHLAMYDIA, DNA PROBE: NEGATIVE
CHLAMYDIA, DNA PROBE: NEGATIVE
NEISSERIA GONORRHEA: NEGATIVE
Neisseria Gonorrhea: NEGATIVE
TRICH (WINDOWPATH): NEGATIVE
Trichomonas: NEGATIVE

## 2018-03-11 LAB — RPR: RPR: NONREACTIVE

## 2018-03-11 LAB — HIV ANTIBODY (ROUTINE TESTING W REFLEX): HIV Screen 4th Generation wRfx: NONREACTIVE

## 2018-03-12 ENCOUNTER — Telehealth: Payer: Self-pay | Admitting: Family Medicine

## 2018-03-12 LAB — CULTURE, GROUP A STREP (THRC)

## 2018-03-12 MED ORDER — AZITHROMYCIN 250 MG PO TABS: ORAL_TABLET | ORAL | 0 refills | Status: DC

## 2018-03-12 NOTE — Telephone Encounter (Signed)
Test result discussed with her. Still having some throat pain. Will give oral Zithromax for 5 days. F/U soon if there is still no improvement.

## 2018-03-13 ENCOUNTER — Telehealth (HOSPITAL_COMMUNITY): Payer: Self-pay

## 2018-03-13 NOTE — Telephone Encounter (Signed)
Culture is positive for non group A Strep germ.  This is a finding of uncertain significance; not the typical 'strep throat' germ.  Attempted to reach patient. No answer at this time.  

## 2018-08-19 ENCOUNTER — Ambulatory Visit: Payer: Self-pay | Admitting: Student

## 2019-01-13 IMAGING — US US MFM OB COMP +14 WKS
1 series · 13 of 28 positions shown · non-contrast
Comparison: none

[Series 1: us mfm ob comp +14 wks · 120 acquisitions, 13 frames shown]
[im 5/120]
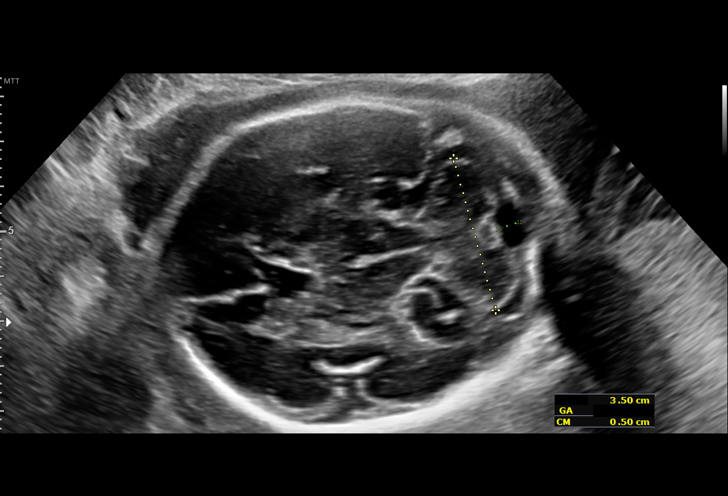
[im 14/120]
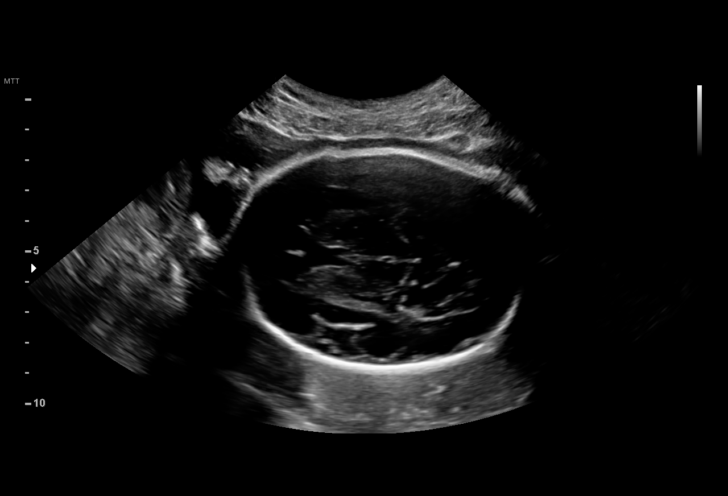
[im 23/120]
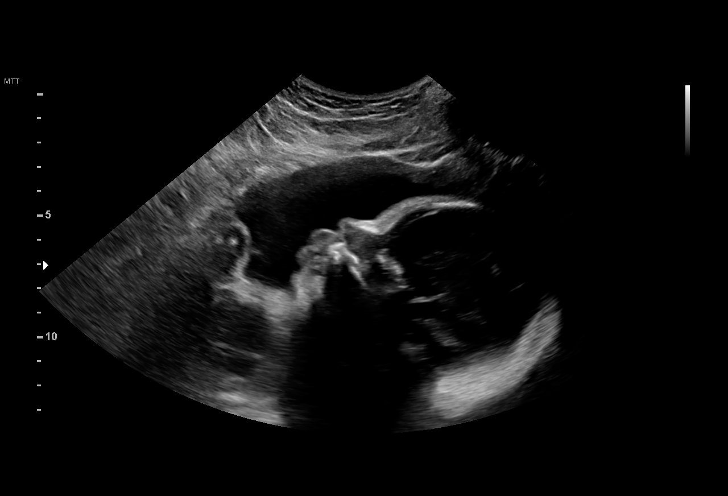
[im 31/120]
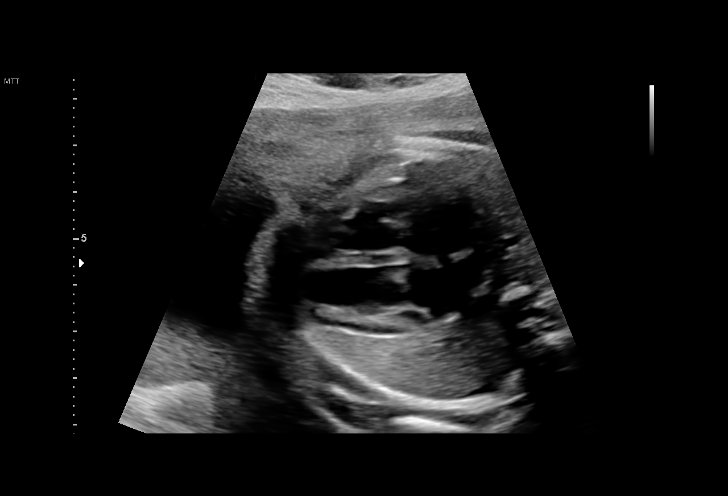
[im 40/120]
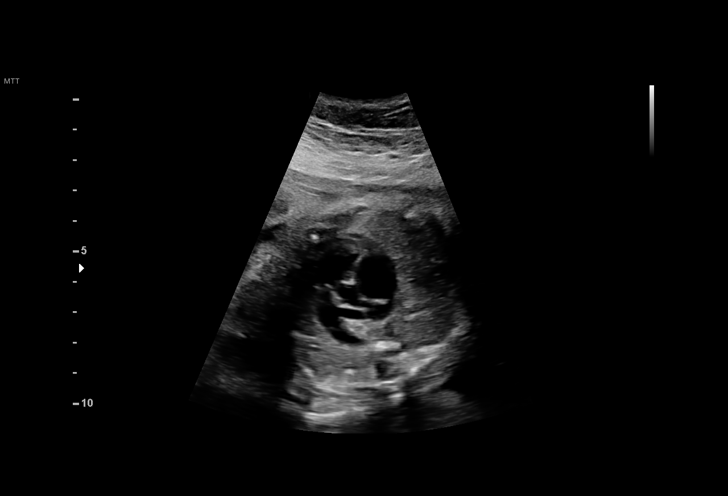
[im 49/120]
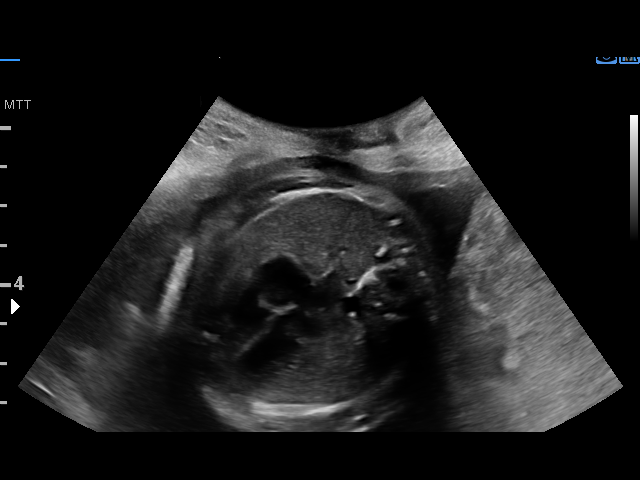
[im 62/120]
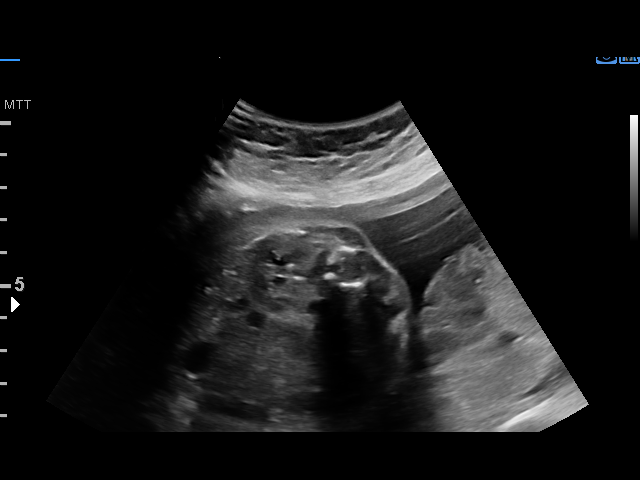
[im 71/120]
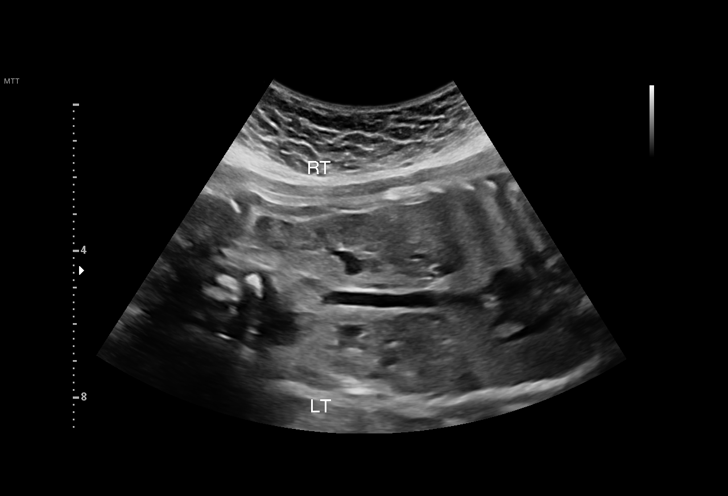
[im 80/120]
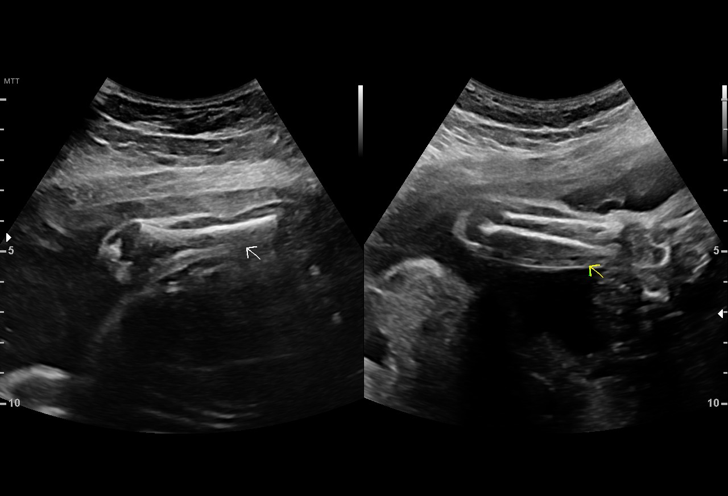
[im 89/120]
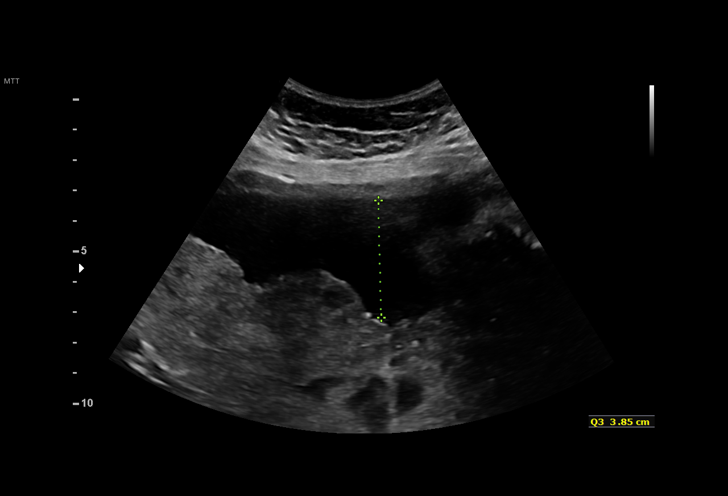
[im 97/120]
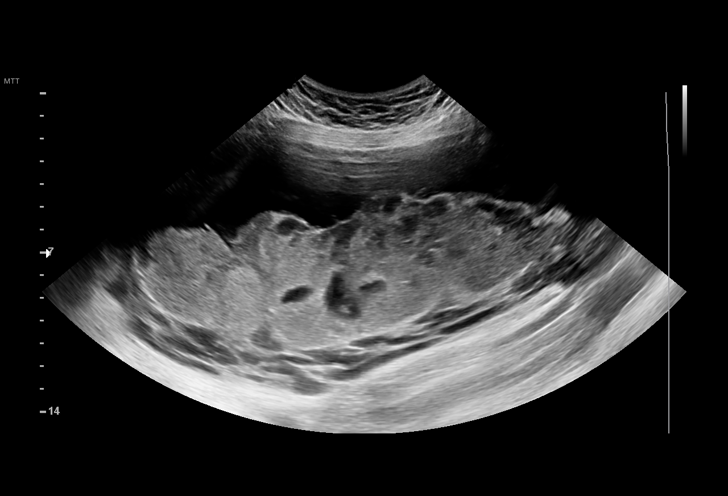
[im 106/120]
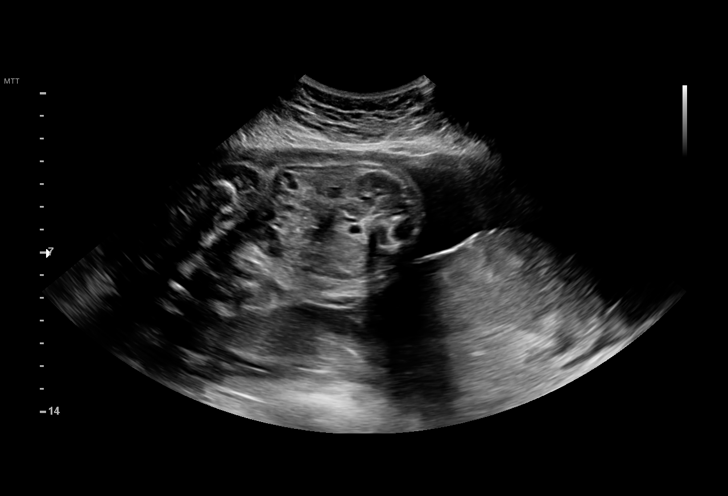
[im 115/120]
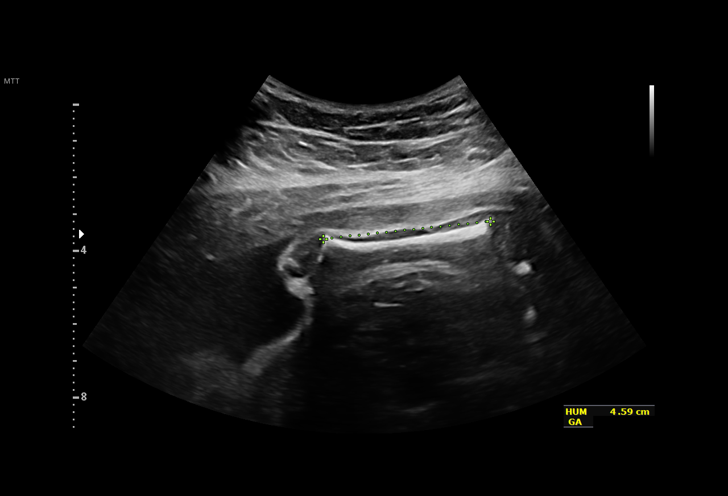

[13 of 28 positions shown; findings below may reference images not displayed]

[facility]

[facility]
Indications

28 weeks gestation of pregnancy
Encounter for uncertain dates
Encounter for antenatal screening for
malformations
History of cesarean delivery, currently
pregnant
Late prenatal care, second trimester
Drug use complicating pregnancy, second
trimester (+THC)
OB History

Gravidity:    2         Term:   1        Prem:   0        SAB:   0
TOP:          0       Ectopic:  0        Living: 1
Fetal Evaluation

Num Of Fetuses:     1
Cardiac Activity:   Observed
Presentation:       Cephalic
Placenta:           Posterior, above cervical os
P. Cord Insertion:  Visualized, central
Amniotic Fluid
AFI FV:      Subjectively within normal limits

AFI Sum(cm)     %Tile       Largest Pocket(cm)
14.92           52

RUQ(cm)       RLQ(cm)       LUQ(cm)        LLQ(cm)
4.14
Biometry

BPD:      70.8  mm     G. Age:  28w 3d         33  %    CI:         70.6   %    70 - 86
FL/HC:      19.0   %    19.6 -
HC:      268.6  mm     G. Age:  29w 2d         39  %    HC/AC:      1.07        0.99 -
AC:      251.6  mm     G. Age:  29w 3d         67  %    FL/BPD:     72.0   %    71 - 87
FL:         51  mm     G. Age:  27w 2d          9  %    FL/AC:      20.3   %    20 - 24
HUM:        45  mm     G. Age:  26w 5d        < 5  %
CER:        35  mm     G. Age:  30w 0d         77  %
CM:          5  mm

Est. FW:    7572  gm    2 lb 12 oz      52  %
Gestational Age

LMP:           25w 4d        Date:  04/08/16                 EDD:   01/13/17
U/S Today:     28w 4d                                        EDD:   12/23/16
Best:          28w 4d     Det. By:  U/S (10/04/16)           EDD:   12/23/16
Anatomy

Cranium:               Appears normal         Aortic Arch:            Appears normal
Cavum:                 Appears normal         Ductal Arch:            Appears normal
Ventricles:            Appears normal         Diaphragm:              Appears normal
Choroid Plexus:        Appears normal         Stomach:                Appears normal, left
sided
Cerebellum:            Appears normal         Abdomen:                Appears normal
Posterior Fossa:       Appears normal         Abdominal Wall:         Appears nml (cord
insert, abd wall)
Nuchal Fold:           Not applicable (>20    Cord Vessels:           Appears normal (3
wks GA)                                        vessel cord)
Face:                  Appears normal         Kidneys:                See comments
(orbits and profile)
Lips:                  Appears normal         Bladder:                Appears normal
Thoracic:              Appears normal         Spine:                  Appears normal
Heart:                 Appears normal         Upper Extremities:      Visualized
(4CH, axis, and situs
RVOT:                  Appears normal         Lower Extremities:      Appears normal
LVOT:                  Appears normal

Other:  Fetus appears to be a male. Heels visualized. Technically difficult due
to advanced GA and fetal position.
Cervix Uterus Adnexa

Cervix
Length:           4.35  cm.
Normal appearance by transabdominal scan.

Uterus
No abnormality visualized.
Left Ovary
Size(cm)     2.84   x   1.92   x  2.12      Vol(ml):
Within normal limits. No adnexal mass visualized.

Right Ovary
Size(cm)     2.26   x   2.69   x  1.67      Vol(ml):
Within normal limits. No adnexal mass visualized.

Cul De Sac:   No free fluid seen.

Adnexa:       No abnormality visualized.
Impression

Single IUP at 28w 4d by utlrasound today
Uncertain dates/ late prenatal care
The left fetal kidney is present but appears smaller than the
right (Right renal length 3.49 cm; left renal length 2.5 3 cm)
The remainder of the fetal anatomy appears normal
The estimated fetal weight is at the 52nd %tile
Posterior placenta without previa
Normal amniotic fluid volume
Recommendations

Recommend follow-up ultrasound examination in 4 weeks for
growth and to reevaluate the fetal kidneys

## 2020-04-19 ENCOUNTER — Ambulatory Visit (INDEPENDENT_AMBULATORY_CARE_PROVIDER_SITE_OTHER): Payer: Medicaid Other

## 2020-04-19 DIAGNOSIS — Z3480 Encounter for supervision of other normal pregnancy, unspecified trimester: Secondary | ICD-10-CM

## 2020-04-19 DIAGNOSIS — Z3A10 10 weeks gestation of pregnancy: Secondary | ICD-10-CM

## 2020-04-19 MED ORDER — BLOOD PRESSURE KIT DEVI: 1.0000 | 0 refills | Status: DC

## 2020-04-19 NOTE — Progress Notes (Signed)
  Virtual Visit via Telephone Note  I connected with Julia Santiago on 04/19/20 at  9:00 AM EDT by telephone and verified that I am speaking with the correct person using two identifiers.  Location: Patient:home Provider: [facility]   I discussed the limitations, risks, security and privacy concerns of performing an evaluation and management service by telephone and the availability of in person appointments. I also discussed with the patient that there may be a patient responsible charge related to this service. The patient expressed understanding and agreed to proceed.   History of Present Illness: PRENATAL INTAKE SUMMARY  [name] presents today New OB Nurse Interview.  OB History    Gravida  3   Para  2   Term  2   Preterm      AB      Living  2     SAB      TAB      Ectopic      Multiple  0   Live Births  2          I have reviewed the patient's medical, obstetrical, social, and family histories, medications, and available lab results.  SUBJECTIVE She has no unusual complaints   Observations/Objective: Initial nurse interview for history/labs (New OB)  LMP:  02/03/2020 EDD: 11/09/2020 GA: [redacted]w[redacted]d GP   GENERAL APPEARANCE: oriented to person, place and time  Assessment and Plan: Normal pregnancy Prenatal care at [facility] OB Pnl/HIV labs will be done at [facility] visit Patient had [place] at [facility] PHQ-9=9 BP Cuff ordered Babyscripts downloaded Pregnancy Risk Screening done   Follow Up Instructions:   I discussed the assessment and treatment plan with the patient. The patient was provided an opportunity to ask questions and all were answered. The patient agreed with the plan and demonstrated an understanding of the instructions.   The patient was advised to call back or seek an in-person evaluation if the symptoms worsen or if the condition fails to improve as anticipated. Pick up BP Cuff and take it to [facility] Appt we will show her how to use it.  I  provided 15 minutes of non-face-to-face time during this encounter.   Maretta Bees, RMA

## 2020-04-19 NOTE — Progress Notes (Signed)
I have reviewed the chart and agree with the nurse's note and plan of care for this visit.   Sharen Counter, CNM 10:07 AM

## 2020-04-25 ENCOUNTER — Encounter: Payer: Medicaid Other | Admitting: Licensed Clinical Social Worker

## 2020-04-26 ENCOUNTER — Other Ambulatory Visit: Payer: Self-pay

## 2020-04-26 ENCOUNTER — Ambulatory Visit (INDEPENDENT_AMBULATORY_CARE_PROVIDER_SITE_OTHER): Payer: Medicaid Other | Admitting: Advanced Practice Midwife

## 2020-04-26 ENCOUNTER — Other Ambulatory Visit (HOSPITAL_COMMUNITY)
Admission: RE | Admit: 2020-04-26 | Discharge: 2020-04-26 | Disposition: A | Discharge status: Home / Self Care | Payer: Medicaid Other | Source: Ambulatory Visit | Attending: Advanced Practice Midwife | Admitting: Advanced Practice Midwife

## 2020-04-26 ENCOUNTER — Encounter: Payer: Medicaid Other | Admitting: Advanced Practice Midwife

## 2020-04-26 ENCOUNTER — Ambulatory Visit (INDEPENDENT_AMBULATORY_CARE_PROVIDER_SITE_OTHER): Payer: Medicaid Other

## 2020-04-26 ENCOUNTER — Encounter: Payer: Self-pay | Admitting: Advanced Practice Midwife

## 2020-04-26 VITALS — Wt 213.4 lb

## 2020-04-26 DIAGNOSIS — Z3A11 11 weeks gestation of pregnancy: Secondary | ICD-10-CM

## 2020-04-26 DIAGNOSIS — O3680X0 Pregnancy with inconclusive fetal viability, not applicable or unspecified: Secondary | ICD-10-CM

## 2020-04-26 DIAGNOSIS — Z8632 Personal history of gestational diabetes: Secondary | ICD-10-CM | POA: Insufficient documentation

## 2020-04-26 DIAGNOSIS — Z3481 Encounter for supervision of other normal pregnancy, first trimester: Secondary | ICD-10-CM

## 2020-04-26 DIAGNOSIS — Z8659 Personal history of other mental and behavioral disorders: Secondary | ICD-10-CM | POA: Insufficient documentation

## 2020-04-26 DIAGNOSIS — Z98891 History of uterine scar from previous surgery: Secondary | ICD-10-CM

## 2020-04-26 DIAGNOSIS — Z716 Tobacco abuse counseling: Secondary | ICD-10-CM

## 2020-04-26 DIAGNOSIS — Z3A12 12 weeks gestation of pregnancy: Secondary | ICD-10-CM | POA: Diagnosis not present

## 2020-04-26 DIAGNOSIS — Z3480 Encounter for supervision of other normal pregnancy, unspecified trimester: Secondary | ICD-10-CM

## 2020-04-26 MED ORDER — NICOTINE 7 MG/24HR TD PT24: 7.0000 mg | MEDICATED_PATCH | Freq: Every day | TRANSDERMAL | 0 refills | Status: DC

## 2020-04-26 MED ORDER — ASPIRIN EC 81 MG PO TBEC: 81.0000 mg | DELAYED_RELEASE_TABLET | Freq: Every day | ORAL | 11 refills | Status: DC

## 2020-04-26 NOTE — Progress Notes (Signed)
Pt is here for initial prenatal visit.   LMP 02/03/20. EDD 11/09/20.   Last pap 10/23/16, normal.   PHQ-9 score: 6

## 2020-04-26 NOTE — Progress Notes (Deleted)
Subjective:   Julia Santiago is a 29 y.o. G3P2002 at [redacted]w[redacted]d by {Ob dating:14516} being seen today for her first obstetrical visit.  Her obstetrical history is significant for {ob risk factors:10154} and has Previous cesarean delivery, antepartum condition or complication; Pregnancy, supervision, high-risk; Ultrasound scan abnormal; Gestational diabetes mellitus (GDM) affecting pregnancy, antepartum; Insufficient prenatal care; S/P cesarean section; Depression affecting pregnancy, postpartum; and Supervision of other normal pregnancy, antepartum on their problem list.. Patient {does/does not:19097} intend to breast feed. Pregnancy history fully reviewed.  Patient reports {sx:14538}.  HISTORY: OB History  Gravida Para Term Preterm AB Living  $Remov'3 2 2 'LAIulD$ 0 0 2  SAB TAB Ectopic Multiple Live Births  0 0 0 0 2    # Outcome Date GA Lbr Len/2nd Weight Sex Delivery Anes PTL Lv  3 Current           2 Term 12/21/16 [redacted]w[redacted]d  8 lb 2 oz (3.685 kg) M CS-LTranv Spinal  LIV     Name: Parrott,BOY Dashana     Apgar1: 8  Apgar5: 9  1 Term 01/29/15 [redacted]w[redacted]d  7 lb 10.8 oz (3.481 kg) F CS-LTranv Spinal  LIV     Apgar1: 9  Apgar5: 9   Past Medical History:  Diagnosis Date  . Anxiety   . Asthma   . Depression    was on certaline for pp depression  . Gestational diabetes   . Headache    Past Surgical History:  Procedure Laterality Date  . CESAREAN SECTION N/A 01/29/2015   Procedure: CESAREAN SECTION;  Surgeon: Shelly Bombard, MD;  Location: [facility];  Service: Obstetrics;  Laterality: N/A;  . CESAREAN SECTION N/A 12/21/2016   Procedure: REPEAT CESAREAN SECTION;  Surgeon: Mora Bellman, MD;  Location: [facility];  Service: Obstetrics;  Laterality: N/A;   Family History  Problem Relation Age of Onset  . Anxiety disorder Mother   . Depression Mother   . Heart disease Father   . Stroke Maternal Grandmother   . Arthritis Maternal Grandmother   . Heart disease Maternal Grandfather   . Alcohol  abuse Maternal Grandfather   . Cancer Paternal Grandmother    Social History   Tobacco Use  . Smoking status: Current Every Day Smoker    Types: Cigarettes    Last attempt to quit: 05/11/2014    Years since quitting: 5.9  . Smokeless tobacco: Never Used  Vaping Use  . Vaping Use: Never used  Substance Use Topics  . Alcohol use: Yes    Alcohol/week: 0.0 standard drinks    Comment: social use  . Drug use: No   Allergies  Allergen Reactions  . Penicillins     Has patient had a PCN reaction causing immediate rash, facial/tongue/throat swelling, SOB or lightheadedness with hypotension: No Has patient had a PCN reaction causing severe rash involving mucus membranes or skin necrosis: No Has patient had a PCN reaction that required hospitalization: No Has patient had a PCN reaction occurring within the last 10 years: No If all of the above answers are "NO", then may proceed with Cephalosporin use.  As a child-reaction unknown   Current Outpatient Medications on File Prior to Visit  Medication Sig Dispense Refill  . azithromycin (ZITHROMAX) 250 MG tablet Take 2 tabs day 1, then 1 tab daily (Patient not taking: Reported on 04/19/2020) 6 each 0  . Blood Pressure Monitoring (BLOOD PRESSURE KIT) DEVI 1 kit by Does not apply route once a week. Check Blood  Pressure regularly and record readings into the [employer] App.  Large Cuff.  DX O90.0 1 each 0  . docusate sodium (COLACE) 100 MG capsule Take 1 capsule (100 mg total) by mouth 2 (two) times daily as needed. (Patient not taking: Reported on 04/19/2020) 30 capsule 2  . oxyCODONE (OXY IR/ROXICODONE) 5 MG immediate release tablet Take 1 tablet (5 mg total) by mouth every 4 (four) hours as needed (pain scale 4-7). (Patient not taking: Reported on 04/19/2020) 30 tablet 0  . Prenatal Vit-Fe Fumarate-FA (PRENATAL VITAMINS) 28-0.8 MG TABS Take 1 tablet by mouth daily. (Patient not taking: Reported on 12/20/2016) 60 tablet 3  . sertraline (ZOLOFT) 50  MG tablet Take 1 tablet (50 mg total) by mouth daily. (Patient not taking: Reported on 04/19/2020) 30 tablet 5   No current facility-administered medications on file prior to visit.    *** Indications for ASA therapy (per uptodate) One of the following: Previous pregnancy with preeclampsia, especially early onset and with an adverse outcome {yes/no:20286} Multifetal gestation {yes/no:20286} Chronic hypertension {yes/no:20286} Type 1 or 2 diabetes mellitus {yes/no:20286} Chronic kidney disease {yes/no:20286} Autoimmune disease (antiphospholipid syndrome, systemic lupus erythematosus) {yes/no:20286}  *** Two or more of the following: Nulliparity {yes/no:20286} Obesity (body mass index >30 kg/m2) {yes/no:20286} Family history of preeclampsia in mother or sister {yes/no:20286} Age ?35 years {yes/no:20286} Sociodemographic characteristics (African American race, low socioeconomic level) {yes/no:20286} Personal risk factors (eg, previous pregnancy with low birth weight or small for gestational age infant, previous adverse pregnancy outcome [eg, stillbirth], interval >10 years between pregnancies) {yes/no:20286}  *** Indications for early 1 hour GTT (per uptodate)  BMI >25 (>23 in Asian women) AND one of the following  Gestational diabetes mellitus in a previous pregnancy {yes/no:20286} Glycated hemoglobin ?5.7 percent (39 mmol/mol), impaired glucose tolerance, or impaired fasting glucose on previous testing {yes/no:20286} First-degree relative with diabetes {yes/no:20286} High-risk race/ethnicity (eg, African American, Latino, Native American, [place] American, [place]er) {yes/no:20286} History of cardiovascular disease {yes/no:20286} Hypertension or on therapy for hypertension {yes/no:20286} High-density lipoprotein cholesterol level <35 mg/dL (0.90 mmol/L) and/or a triglyceride level >250 mg/dL (2.82 mmol/L) {yes/no:20286} Polycystic ovary syndrome {yes/no:20286} Physical  inactivity {yes/no:20286} Other clinical condition associated with insulin resistance (eg, severe obesity, acanthosis nigricans) {yes/no:20286} Previous birth of an infant weighing ?4000 g {yes/no:20286} Previous stillbirth of unknown cause {yes/no:20286} Exam   There were no vitals filed for this visit.    Uterus:     Pelvic Exam: Perineum: no hemorrhoids, normal perineum   Vulva: normal external genitalia, no lesions   Vagina:  normal mucosa, normal discharge   Cervix: no lesions and normal, pap smear done.    Adnexa: normal adnexa and no mass, fullness, tenderness   Bony Pelvis: average  System: General: well-developed, well-nourished female in no acute distress   Breast:  normal appearance, no masses or tenderness   Skin: normal coloration and turgor, no rashes   Neurologic: oriented, normal, negative, normal mood   Extremities: normal strength, tone, and muscle mass, ROM of all joints is normal   HEENT PERRLA, extraocular movement intact and sclera clear, anicteric   Mouth/Teeth mucous membranes moist, pharynx normal without lesions and dental hygiene good   Neck supple and no masses   Cardiovascular: regular rate and rhythm   Respiratory:  no respiratory distress, normal breath sounds   Abdomen: soft, non-tender; bowel sounds normal; no masses,  no organomegaly     Assessment:   Pregnancy: [id number] Patient Active Problem List   Diagnosis Date Noted  . Supervision  of other normal pregnancy, antepartum 04/19/2020  . Depression affecting pregnancy, postpartum 12/23/2016  . S/P cesarean section 12/21/2016  . Insufficient prenatal care 12/19/2016  . Gestational diabetes mellitus (GDM) affecting pregnancy, antepartum 11/14/2016  . Pregnancy, supervision, high-risk 10/08/2016  . Ultrasound scan abnormal 10/08/2016  . Previous cesarean delivery, antepartum condition or complication 35/45/6256     Plan:  1. Supervision of other normal pregnancy, antepartum ***  2. [redacted] weeks  gestation of pregnancy ***    Initial labs drawn. Continue prenatal vitamins. Discussed and offered genetic screening options, including Quad screen/AFP, NIPS testing, and option to decline testing. Benefits/risks/alternatives reviewed. Pt aware that anatomy US is form of genetic screening with lower accuracy in detecting trisomies than blood work.  Pt chooses/declines genetic screening today. {Blank multiple:19196::"First trimester screen","Quad screen","NIPS"}: {requests/ordered/declines:14581}. Ultrasound discussed; fetal anatomic survey: {requests/ordered/declines:14581}. Problem list reviewed and updated. The nature of [facility] with multiple MDs and other Advanced Practice Providers was explained to patient; also emphasized that residents, students are part of our team. Routine obstetric precautions reviewed. No follow-ups on file.   Fatima Blank, CNM 04/26/20 8:25 AM

## 2020-04-26 NOTE — Patient Instructions (Signed)
First Trimester of Pregnancy The first trimester of pregnancy is from week 1 until the end of week 13 (months 1 through 3). A week after a sperm fertilizes an egg, the egg will implant on the wall of the uterus. This embryo will begin to develop into a baby. Genes from you and your partner will form the baby. The female genes will determine whether the baby will be a boy or a girl. At 6-8 weeks, the eyes and face will be formed, and the heartbeat can be seen on ultrasound. At the end of 12 weeks, all the baby's organs will be formed. Now that you are pregnant, you will want to do everything you can to have a healthy baby. Two of the most important things are to get good prenatal care and to follow your health care provider's instructions. Prenatal care is all the medical care you receive before the baby's birth. This care will help prevent, find, and treat any problems during the pregnancy and childbirth. Body changes during your first trimester Your body goes through many changes during pregnancy. The changes vary from woman to woman.  You may gain or lose a couple of pounds at first.  You may feel sick to your stomach (nauseous) and you may throw up (vomit). If the vomiting is uncontrollable, call your health care provider.  You may tire easily.  You may develop headaches that can be relieved by medicines. All medicines should be approved by your health care provider.  You may urinate more often. Painful urination may mean you have a bladder infection.  You may develop heartburn as a result of your pregnancy.  You may develop constipation because certain hormones are causing the muscles that push stool through your intestines to slow down.  You may develop hemorrhoids or swollen veins (varicose veins).  Your breasts may begin to grow larger and become tender. Your nipples may stick out more, and the tissue that surrounds them (areola) may become darker.  Your gums may bleed and may be  sensitive to brushing and flossing.  Dark spots or blotches (chloasma, mask of pregnancy) may develop on your face. This will likely fade after the baby is born.  Your menstrual periods will stop.  You may have a loss of appetite.  You may develop cravings for certain kinds of food.  You may have changes in your emotions from day to day, such as being excited to be pregnant or being concerned that something may go wrong with the pregnancy and baby.  You may have more vivid and strange dreams.  You may have changes in your hair. These can include thickening of your hair, rapid growth, and changes in texture. Some women also have hair loss during or after pregnancy, or hair that feels dry or thin. Your hair will most likely return to normal after your baby is born. What to expect at prenatal visits During a routine prenatal visit:  You will be weighed to make sure you and the baby are growing normally.  Your blood pressure will be taken.  Your abdomen will be measured to track your baby's growth.  The fetal heartbeat will be listened to between weeks 10 and 14 of your pregnancy.  Test results from any previous visits will be discussed. Your health care provider may ask you:  How you are feeling.  If you are feeling the baby move.  If you have had any abnormal symptoms, such as leaking fluid, bleeding, severe headaches, or abdominal   cramping.  If you are using any tobacco products, including cigarettes, chewing tobacco, and electronic cigarettes.  If you have any questions. Other tests that may be performed during your first trimester include:  Blood tests to find your blood type and to check for the presence of any previous infections. The tests will also be used to check for low iron levels (anemia) and protein on red blood cells (Rh antibodies). Depending on your risk factors, or if you previously had diabetes during pregnancy, you may have tests to check for high blood sugar  that affects pregnant women (gestational diabetes).  Urine tests to check for infections, diabetes, or protein in the urine.  An ultrasound to confirm the proper growth and development of the baby.  Fetal screens for spinal cord problems (spina bifida) and Down syndrome.  HIV (human immunodeficiency virus) testing. Routine prenatal testing includes screening for HIV, unless you choose not to have this test.  You may need other tests to make sure you and the baby are doing well. Follow these instructions at home: Medicines  Follow your health care provider's instructions regarding medicine use. Specific medicines may be either safe or unsafe to take during pregnancy.  Take a prenatal vitamin that contains at least 600 micrograms (mcg) of folic acid.  If you develop constipation, try taking a stool softener if your health care provider approves. Eating and drinking   Eat a balanced diet that includes fresh fruits and vegetables, whole grains, good sources of protein such as meat, eggs, or tofu, and low-fat dairy. Your health care provider will help you determine the amount of weight gain that is right for you.  Avoid raw meat and uncooked cheese. These carry germs that can cause birth defects in the baby.  Eating four or five small meals rather than three large meals a day may help relieve nausea and vomiting. If you start to feel nauseous, eating a few soda crackers can be helpful. Drinking liquids between meals, instead of during meals, also seems to help ease nausea and vomiting.  Limit foods that are high in fat and processed sugars, such as fried and sweet foods.  To prevent constipation: ? Eat foods that are high in fiber, such as fresh fruits and vegetables, whole grains, and beans. ? Drink enough fluid to keep your urine clear or pale yellow. Activity  Exercise only as directed by your health care provider. Most women can continue their usual exercise routine during  pregnancy. Try to exercise for 30 minutes at least 5 days a week. Exercising will help you: ? Control your weight. ? Stay in shape. ? Be prepared for labor and delivery.  Experiencing pain or cramping in the lower abdomen or lower back is a good sign that you should stop exercising. Check with your health care provider before continuing with normal exercises.  Try to avoid standing for long periods of time. Move your legs often if you must stand in one place for a long time.  Avoid heavy lifting.  Wear low-heeled shoes and practice good posture.  You may continue to have sex unless your health care provider tells you not to. Relieving pain and discomfort  Wear a good support bra to relieve breast tenderness.  Take warm sitz baths to soothe any pain or discomfort caused by hemorrhoids. Use hemorrhoid cream if your health care provider approves.  Rest with your legs elevated if you have leg cramps or low back pain.  If you develop varicose veins in   your legs, wear support hose. Elevate your feet for 15 minutes, 3-4 times a day. Limit salt in your diet. Prenatal care  Schedule your prenatal visits by the twelfth week of pregnancy. They are usually scheduled monthly at first, then more often in the last 2 months before delivery.  Write down your questions. Take them to your prenatal visits.  Keep all your prenatal visits as told by your health care provider. This is important. Safety  Wear your seat belt at all times when driving.  Make a list of emergency phone numbers, including numbers for family, friends, the hospital, and police and fire departments. General instructions  Ask your health care provider for a referral to a local prenatal education class. Begin classes no later than the beginning of month 6 of your pregnancy.  Ask for help if you have counseling or nutritional needs during pregnancy. Your health care provider can offer advice or refer you to specialists for help  with various needs.  Do not use hot tubs, steam rooms, or saunas.  Do not douche or use tampons or scented sanitary pads.  Do not cross your legs for long periods of time.  Avoid cat litter boxes and soil used by cats. These carry germs that can cause birth defects in the baby and possibly loss of the fetus by miscarriage or stillbirth.  Avoid all smoking, herbs, alcohol, and medicines not prescribed by your health care provider. Chemicals in these products affect the formation and growth of the baby.  Do not use any products that contain nicotine or tobacco, such as cigarettes and e-cigarettes. If you need help quitting, ask your health care provider. You may receive counseling support and other resources to help you quit.  Schedule a dentist appointment. At home, brush your teeth with a soft toothbrush and be gentle when you floss. Contact a health care provider if:  You have dizziness.  You have mild pelvic cramps, pelvic pressure, or nagging pain in the abdominal area.  You have persistent nausea, vomiting, or diarrhea.  You have a bad smelling vaginal discharge.  You have pain when you urinate.  You notice increased swelling in your face, hands, legs, or ankles.  You are exposed to fifth disease or chickenpox.  You are exposed to German measles (rubella) and have never had it. Get help right away if:  You have a fever.  You are leaking fluid from your vagina.  You have spotting or bleeding from your vagina.  You have severe abdominal cramping or pain.  You have rapid weight gain or loss.  You vomit blood or material that looks like coffee grounds.  You develop a severe headache.  You have shortness of breath.  You have any kind of trauma, such as from a fall or a car accident. Summary  The first trimester of pregnancy is from week 1 until the end of week 13 (months 1 through 3).  Your body goes through many changes during pregnancy. The changes vary from  woman to woman.  You will have routine prenatal visits. During those visits, your health care provider will examine you, discuss any test results you may have, and talk with you about how you are feeling. This information is not intended to replace advice given to you by your health care provider. Make sure you discuss any questions you have with your health care provider. Document Revised: 06/07/2017 Document Reviewed: 06/06/2016 [employer] Patient Education  2020 [employer]  

## 2020-04-26 NOTE — Progress Notes (Signed)
Subjective:   Julia Santiago is a 29 y.o. G3P2002 at [redacted]w[redacted]d by LMP being seen today for her first obstetrical visit.  Her obstetrical history is significant for smoker and Hx GDM, obesity BMI 32, C/S x 2 and has Pregnancy, supervision, high-risk; History of 2 cesarean sections; Supervision of other normal pregnancy, antepartum; History of gestational diabetes; and History of depression on their problem list.. Patient does intend to breast feed. Pregnancy history fully reviewed.  Patient reports no complaints.    Initial Prenatal from 04/26/2020 in [place]  PHQ-9 Total Score 6     HISTORY: OB History  Gravida Para Term Preterm AB Living  $Remov'3 2 2 'bhNfVI$ 0 0 2  SAB TAB Ectopic Multiple Live Births  0 0 0 0 2    # Outcome Date GA Lbr Len/2nd Weight Sex Delivery Anes PTL Lv  3 Current           2 Term 12/21/16 [redacted]w[redacted]d  8 lb 2 oz (3.685 kg) M CS-LTranv Spinal  LIV     Name: Russomanno,BOY Jackelyne     Apgar1: 8  Apgar5: 9  1 Term 01/29/15 [redacted]w[redacted]d  7 lb 10.8 oz (3.481 kg) F CS-LTranv Spinal  LIV     Apgar1: 9  Apgar5: 9   Past Medical History:  Diagnosis Date  . Anxiety   . Asthma   . Depression    was on certaline for pp depression  . Gestational diabetes   . Headache    Past Surgical History:  Procedure Laterality Date  . CESAREAN SECTION N/A 01/29/2015   Procedure: CESAREAN SECTION;  Surgeon: Shelly Bombard, MD;  Location: [facility];  Service: Obstetrics;  Laterality: N/A;  . CESAREAN SECTION N/A 12/21/2016   Procedure: REPEAT CESAREAN SECTION;  Surgeon: Mora Bellman, MD;  Location: [place];  Service: Obstetrics;  Laterality: N/A;   Family History  Problem Relation Age of Onset  . Anxiety disorder Mother   . Depression Mother   . Heart disease Father   . Stroke Maternal Grandmother   . Arthritis Maternal Grandmother   . Heart disease Maternal Grandfather   . Alcohol abuse Maternal Grandfather   . Cancer Paternal Grandmother    Social  History   Tobacco Use  . Smoking status: Current Every Day Smoker    Packs/day: 0.25    Types: Cigarettes    Last attempt to quit: 05/11/2014    Years since quitting: 5.9  . Smokeless tobacco: Never Used  Vaping Use  . Vaping Use: Never used  Substance Use Topics  . Alcohol use: Yes    Alcohol/week: 0.0 standard drinks    Comment: social use  . Drug use: No   Allergies  Allergen Reactions  . Penicillins     Has patient had a PCN reaction causing immediate rash, facial/tongue/throat swelling, SOB or lightheadedness with hypotension: No Has patient had a PCN reaction causing severe rash involving mucus membranes or skin necrosis: No Has patient had a PCN reaction that required hospitalization: No Has patient had a PCN reaction occurring within the last 10 years: No If all of the above answers are "NO", then may proceed with Cephalosporin use.  As a child-reaction unknown   Current Outpatient Medications on File Prior to Visit  Medication Sig Dispense Refill  . azithromycin (ZITHROMAX) 250 MG tablet Take 2 tabs day 1, then 1 tab daily (Patient not taking: Reported on 04/19/2020) 6 each 0  . Blood  Pressure Monitoring (BLOOD PRESSURE KIT) DEVI 1 kit by Does not apply route once a week. Check Blood Pressure regularly and record readings into the [employer] App.  Large Cuff.  DX O90.0 (Patient not taking: Reported on 04/26/2020) 1 each 0  . docusate sodium (COLACE) 100 MG capsule Take 1 capsule (100 mg total) by mouth 2 (two) times daily as needed. (Patient not taking: Reported on 04/19/2020) 30 capsule 2  . oxyCODONE (OXY IR/ROXICODONE) 5 MG immediate release tablet Take 1 tablet (5 mg total) by mouth every 4 (four) hours as needed (pain scale 4-7). (Patient not taking: Reported on 04/19/2020) 30 tablet 0  . Prenatal Vit-Fe Fumarate-FA (PRENATAL VITAMINS) 28-0.8 MG TABS Take 1 tablet by mouth daily. (Patient not taking: Reported on 12/20/2016) 60 tablet 3  . sertraline (ZOLOFT) 50 MG  tablet Take 1 tablet (50 mg total) by mouth daily. (Patient not taking: Reported on 04/19/2020) 30 tablet 5   No current facility-administered medications on file prior to visit.     Indications for ASA therapy (per uptodate) One of the following: Previous pregnancy with preeclampsia, especially early onset and with an adverse outcome No Multifetal gestation No Chronic hypertension No Type 1 or 2 diabetes mellitus No Chronic kidney disease No Autoimmune disease (antiphospholipid syndrome, systemic lupus erythematosus) No   Two or more of the following: Nulliparity No Obesity (body mass index >30 kg/m2) Yes Family history of preeclampsia in mother or sister No Age ?35 years No Sociodemographic characteristics (African American race, low socioeconomic level) Yes Personal risk factors (eg, previous pregnancy with low birth weight or small for gestational age infant, previous adverse pregnancy outcome [eg, stillbirth], interval >10 years between pregnancies) No   Indications for early 1 hour GTT (per uptodate)  BMI >25 (>23 in Asian women) AND one of the following  Gestational diabetes mellitus in a previous pregnancy Yes Glycated hemoglobin ?5.7 percent (39 mmol/mol), impaired glucose tolerance, or impaired fasting glucose on previous testing No First-degree relative with diabetes No High-risk race/ethnicity (eg, African American, Latino, Native American, [place] American, Pacific Islander) Yes History of cardiovascular disease No Hypertension or on therapy for hypertension No High-density lipoprotein cholesterol level <35 mg/dL (1.61 mmol/L) and/or a triglyceride level >250 mg/dL (0.96 mmol/L) No Polycystic ovary syndrome No Physical inactivity No Other clinical condition associated with insulin resistance (eg, severe obesity, acanthosis nigricans) No Previous birth of an infant weighing ?4000 g No Previous stillbirth of unknown cause No Exam   Vitals:   04/26/20 0936  Weight:  213 lb 6.4 oz (96.8 kg)   Fetal Heart Rate (bpm): 166 by u/s  Uterus:     Pelvic Exam: Perineum: no hemorrhoids, normal perineum   Vulva: normal external genitalia, no lesions   Vagina:  normal mucosa, normal discharge   Cervix: no lesions and normal, pap smear done.    Adnexa: normal adnexa and no mass, fullness, tenderness   Bony Pelvis: average  System: General: well-developed, well-nourished female in no acute distress   Breast:  normal appearance, no masses or tenderness   Skin: normal coloration and turgor, no rashes   Neurologic: oriented, normal, negative, normal mood   Extremities: normal strength, tone, and muscle mass, ROM of all joints is normal   HEENT PERRLA, extraocular movement intact and sclera clear, anicteric   Mouth/Teeth mucous membranes moist, pharynx normal without lesions and dental hygiene good   Neck supple and no masses   Cardiovascular: regular rate and rhythm   Respiratory:  no respiratory distress, normal  breath sounds   Abdomen: soft, non-tender; bowel sounds normal; no masses,  no organomegaly     Assessment:   Pregnancy: [id number] Patient Active Problem List   Diagnosis Date Noted  . History of gestational diabetes 04/26/2020  . History of depression 04/26/2020  . Supervision of other normal pregnancy, antepartum 04/19/2020  . History of 2 cesarean sections 12/21/2016  . Pregnancy, supervision, high-risk 10/08/2016     Plan:  1. Encounter to determine fetal viability of pregnancy, single or unspecified fetus --[place] today with  GA c/w LMP dates - US OB Limited; Future  2. Supervision of other normal pregnancy, antepartum --Anticipatory guidance about next visits/weeks of pregnancy given. --Next visit in 4 weeks in office for AFP  - Cytology - PAP( [facility]) - Cervicovaginal ancillary only - CBC/D/Plt+RPR+Rh+ABO+Rub Ab... - Culture, OB Urine - Genetic Screening - Hemoglobin A1c - aspirin EC 81 MG tablet; Take 1 tablet (81 mg total) by  mouth daily. Swallow whole.  Dispense: 30 tablet; Refill: 11  3. Encounter for smoking cessation counseling --Pt reduced from 1 pack/day to 2 cigarettes daily at this time, desires to quit. Wants partner to reduce/quit too.    - nicotine (NICODERM CQ - DOSED IN MG/24 HR) 7 mg/24hr patch; Place 1 patch (7 mg total) onto the skin daily.  Dispense: 28 patch; Refill: 0   4. History of 2 cesarean sections --Discussed and plans repeat C/S   Initial labs drawn. Continue prenatal vitamins. Discussed and offered genetic screening options, including Quad screen/AFP, NIPS testing, and option to decline testing. Benefits/risks/alternatives reviewed. Pt aware that anatomy US is form of genetic screening with lower accuracy in detecting trisomies than blood work.  Pt chooses genetic screening today. NIPS: ordered. Ultrasound discussed; fetal anatomic survey: ordered. Problem list reviewed and updated. The nature of [place] with multiple MDs and other Advanced Practice Providers was explained to patient; also emphasized that residents, students are part of our team. Routine obstetric precautions reviewed. Return in about 4 weeks (around 05/24/2020).   Fatima Blank, CNM 04/26/20 1:07 PM

## 2020-04-27 LAB — CBC/D/PLT+RPR+RH+ABO+RUB AB...
Antibody Screen: NEGATIVE
Basophils Absolute: 0 10*3/uL (ref 0.0–0.2)
Basos: 0 %
EOS (ABSOLUTE): 0.2 10*3/uL (ref 0.0–0.4)
Eos: 2 %
HCV Ab: <0.1 s/co ratio (ref 0.0–0.9)
HIV Screen 4th Generation wRfx: NONREACTIVE
Hematocrit: 36.7 % (ref 34.0–46.6)
Hemoglobin: 12.2 g/dL (ref 11.1–15.9)
Hepatitis B Surface Ag: NEGATIVE
Immature Grans (Abs): 0 10*3/uL (ref 0.0–0.1)
Immature Granulocytes: 0 %
Lymphocytes Absolute: 2.4 10*3/uL (ref 0.7–3.1)
Lymphs: 24 %
MCH: 29.5 pg (ref 26.6–33.0)
MCHC: 33.2 g/dL (ref 31.5–35.7)
MCV: 89 fL (ref 79–97)
Monocytes Absolute: 0.6 10*3/uL (ref 0.1–0.9)
Monocytes: 6 %
Neutrophils Absolute: 6.9 10*3/uL (ref 1.4–7.0)
Neutrophils: 68 %
Platelets: 336 10*3/uL (ref 150–450)
RBC: 4.14 x10E6/uL (ref 3.77–5.28)
RDW: 11.8 % (ref 11.7–15.4)
RPR Ser Ql: NONREACTIVE
Rh Factor: POSITIVE
Rubella Antibodies, IGG: 0.9 index — ABNORMAL LOW (ref >0.99)
WBC: 10.1 10*3/uL (ref 3.4–10.8)

## 2020-04-27 LAB — CERVICOVAGINAL ANCILLARY ONLY
Bacterial Vaginitis (gardnerella): POSITIVE — AB
Candida Glabrata: NEGATIVE
Candida Vaginitis: POSITIVE — AB
Chlamydia: NEGATIVE
Comment: NEGATIVE
Comment: NEGATIVE
Comment: NEGATIVE
Comment: NEGATIVE
Comment: NEGATIVE
Comment: NORMAL
Neisseria Gonorrhea: NEGATIVE
Trichomonas: NEGATIVE

## 2020-04-27 LAB — HCV INTERPRETATION

## 2020-04-27 LAB — HEMOGLOBIN A1C
Est. average glucose Bld gHb Est-mCnc: 114 mg/dL
Hgb A1c MFr Bld: 5.6 % (ref 4.8–5.6)

## 2020-04-28 LAB — CULTURE, OB URINE

## 2020-04-28 LAB — URINE CULTURE, OB REFLEX

## 2020-04-29 LAB — CYTOLOGY - PAP
Comment: NEGATIVE
Diagnosis: UNDETERMINED — AB
High risk HPV: POSITIVE — AB

## 2020-04-30 ENCOUNTER — Encounter: Payer: Self-pay | Admitting: Advanced Practice Midwife

## 2020-04-30 DIAGNOSIS — R87810 Cervical high risk human papillomavirus (HPV) DNA test positive: Secondary | ICD-10-CM | POA: Insufficient documentation

## 2020-04-30 DIAGNOSIS — R87610 Atypical squamous cells of undetermined significance on cytologic smear of cervix (ASC-US): Secondary | ICD-10-CM | POA: Insufficient documentation

## 2020-05-02 ENCOUNTER — Encounter: Payer: Self-pay | Admitting: Advanced Practice Midwife

## 2020-05-03 ENCOUNTER — Telehealth: Payer: Self-pay | Admitting: *Deleted

## 2020-05-03 NOTE — Telephone Encounter (Signed)
Placed call to pt to discuss lab results. Results discussed and need for colpo explained to pt in detail. Pt states she will keep next appt with Misty Stanley and will schedule following appt for colpo/ROB with MD provider. Pt made aware she may also want to discuss Gardasil with provider if that is something she is interested in after pregnancy.

## 2020-05-19 ENCOUNTER — Encounter: Payer: Medicaid Other | Admitting: Obstetrics & Gynecology

## 2020-05-24 ENCOUNTER — Ambulatory Visit (INDEPENDENT_AMBULATORY_CARE_PROVIDER_SITE_OTHER): Payer: Medicaid Other | Admitting: Advanced Practice Midwife

## 2020-05-24 ENCOUNTER — Telehealth: Payer: Self-pay

## 2020-05-24 ENCOUNTER — Other Ambulatory Visit: Payer: Self-pay

## 2020-05-24 VITALS — BP 127/72 | HR 92 | Wt 213.0 lb

## 2020-05-24 DIAGNOSIS — Z3A19 19 weeks gestation of pregnancy: Secondary | ICD-10-CM

## 2020-05-24 DIAGNOSIS — R102 Pelvic and perineal pain unspecified side: Secondary | ICD-10-CM

## 2020-05-24 DIAGNOSIS — O26899 Other specified pregnancy related conditions, unspecified trimester: Secondary | ICD-10-CM

## 2020-05-24 DIAGNOSIS — Z3480 Encounter for supervision of other normal pregnancy, unspecified trimester: Secondary | ICD-10-CM

## 2020-05-24 DIAGNOSIS — Z3A15 15 weeks gestation of pregnancy: Secondary | ICD-10-CM

## 2020-05-24 DIAGNOSIS — Z98891 History of uterine scar from previous surgery: Secondary | ICD-10-CM

## 2020-05-24 NOTE — Progress Notes (Signed)
   PRENATAL VISIT NOTE  Subjective:  Julia Santiago is a 29 y.o. G3P2002 at [redacted]w[redacted]d being seen today for ongoing prenatal care.  She is currently monitored for the following issues for this low-risk pregnancy and has History of 2 cesarean sections; Supervision of other normal pregnancy, antepartum; History of gestational diabetes; History of depression; and ASCUS with positive high risk HPV cervical on their problem list.  Patient reports some bilateral lower abdomen pain with new job.  Contractions: Not present. Vag. Bleeding: None.  Movement: Present. Denies leaking of fluid.   The following portions of the patient's history were reviewed and updated as appropriate: allergies, current medications, past family history, past medical history, past social history, past surgical history and problem list.   Objective:   Vitals:   05/24/20 0940  BP: 127/72  Pulse: 92  Weight: 213 lb (96.6 kg)    Fetal Status: Fetal Heart Rate (bpm): 150   Movement: Present     General:  Alert, oriented and cooperative. Patient is in no acute distress.  Skin: Skin is warm and dry. No rash noted.   Cardiovascular: Normal heart rate noted  Respiratory: Normal respiratory effort, no problems with respiration noted  Abdomen: Soft, gravid, appropriate for gestational age.  Pain/Pressure: Absent     Pelvic: Cervical exam deferred        Extremities: Normal range of motion.     Mental Status: Normal mood and affect. Normal behavior. Normal judgment and thought content.   Assessment and Plan:  Pregnancy: G3P2002 at [redacted]w[redacted]d 1. Supervision of other normal pregnancy, antepartum --Anticipatory guidance about next visits/weeks of pregnancy given. --Next visit in 4 weeks, virtual, after MFM [place] if possible  2. History of 2 cesarean sections   3. [redacted] weeks gestation of pregnancy  - AFP, Serum, Open Spina Bifida  4. Pain of round ligament affecting pregnancy, antepartum --Rest/ice/heat/warm bath/Tylenol/pregnancy  support belt   Preterm labor symptoms and general obstetric precautions including but not limited to vaginal bleeding, contractions, leaking of fluid and fetal movement were reviewed in detail with the patient. Please refer to After Visit Summary for other counseling recommendations.   Return in about 4 weeks (around 06/21/2020).  No future appointments.  Sharen Counter, CNM

## 2020-05-24 NOTE — Patient Instructions (Signed)
Meet the Provider Zoom Sessions      [facility] for [facility] is now offering FREE monthly 1-hour virtual Zoom sessions for new, current, and prospective patients.        During these sessions, you can:   Learn about our practice, model of care, services   Get answers to questions about pregnancy and birth during COVID   Pick your provider's brain about anything else!    Sessions will be hosted by [employer] for [employer], Producer, television/film/video, Physicians and Midwives          No registration required      2021 Dates:      All at 6pm     October 21st     November 18th   December 16th     January 20th  February 17th    To join one of these meetings, a few minutes before it is set to start:     Copy/paste the link into your web browser:  [email or web address]    OR  Scan the QR code below (open up your camera and point towards QR code; click on tab that pops up on your phone ("zoom")    Second Trimester of Pregnancy The second trimester is from week 14 through week 27 (months 4 through 6). The second trimester is often a time when you feel your best. Your body has adjusted to being pregnant, and you begin to feel better physically. Usually, morning sickness has lessened or quit completely, you may have more energy, and you may have an increase in appetite. The second trimester is also a time when the fetus is growing rapidly. At the end of the sixth month, the fetus is about 9 inches long and weighs about 1 pounds. You will likely begin to feel the baby move (quickening) between 16 and 20 weeks of pregnancy. Body changes during your second trimester Your body continues to go through many changes during your second trimester. The changes vary from woman to woman.  Your weight will continue to increase. You will notice your lower abdomen bulging out.  You may begin to get  stretch marks on your hips, abdomen, and breasts.  You may develop headaches that can be relieved by medicines. The medicines should be approved by your health care provider.  You may urinate more often because the fetus is pressing on your bladder.  You may develop or continue to have heartburn as a result of your pregnancy.  You may develop constipation because certain hormones are causing the muscles that push waste through your intestines to slow down.  You may develop hemorrhoids or swollen, bulging veins (varicose veins).  You may have back pain. This is caused by: ? Weight gain. ? Pregnancy hormones that are relaxing the joints in your pelvis. ? A shift in weight and the muscles that support your balance.  Your breasts will continue to grow and they will continue to become tender.  Your gums may bleed and may be sensitive to brushing and flossing.  Dark spots or blotches (chloasma, mask of pregnancy) may develop on your face. This will likely fade after the baby is born.  A dark line from your belly button to the pubic area (linea nigra) may appear. This will likely fade after the baby is born.  You may have changes in your hair. These can include thickening of your hair, rapid growth, and changes in texture. Some women also have hair loss during or after pregnancy,  or hair that feels dry or thin. Your hair will most likely return to normal after your baby is born. What to expect at prenatal visits During a routine prenatal visit:  You will be weighed to make sure you and the fetus are growing normally.  Your blood pressure will be taken.  Your abdomen will be measured to track your baby's growth.  The fetal heartbeat will be listened to.  Any test results from the previous visit will be discussed. Your health care provider may ask you:  How you are feeling.  If you are feeling the baby move.  If you have had any abnormal symptoms, such as leaking fluid, bleeding,  severe headaches, or abdominal cramping.  If you are using any tobacco products, including cigarettes, chewing tobacco, and electronic cigarettes.  If you have any questions. Other tests that may be performed during your second trimester include:  Blood tests that check for: ? Low iron levels (anemia). ? High blood sugar that affects pregnant women (gestational diabetes) between 64 and 28 weeks. ? Rh antibodies. This is to check for a protein on red blood cells (Rh factor).  Urine tests to check for infections, diabetes, or protein in the urine.  An ultrasound to confirm the proper growth and development of the baby.  An amniocentesis to check for possible genetic problems.  Fetal screens for spina bifida and Down syndrome.  HIV (human immunodeficiency virus) testing. Routine prenatal testing includes screening for HIV, unless you choose not to have this test. Follow these instructions at home: Medicines  Follow your health care provider's instructions regarding medicine use. Specific medicines may be either safe or unsafe to take during pregnancy.  Take a prenatal vitamin that contains at least 600 micrograms (mcg) of folic acid.  If you develop constipation, try taking a stool softener if your health care provider approves. Eating and drinking   Eat a balanced diet that includes fresh fruits and vegetables, whole grains, good sources of protein such as meat, eggs, or tofu, and low-fat dairy. Your health care provider will help you determine the amount of weight gain that is right for you.  Avoid raw meat and uncooked cheese. These carry germs that can cause birth defects in the baby.  If you have low calcium intake from food, talk to your health care provider about whether you should take a daily calcium supplement.  Limit foods that are high in fat and processed sugars, such as fried and sweet foods.  To prevent constipation: ? Drink enough fluid to keep your urine clear  or pale yellow. ? Eat foods that are high in fiber, such as fresh fruits and vegetables, whole grains, and beans. Activity  Exercise only as directed by your health care provider. Most women can continue their usual exercise routine during pregnancy. Try to exercise for 30 minutes at least 5 days a week. Stop exercising if you experience uterine contractions.  Avoid heavy lifting, wear low heel shoes, and practice good posture.  A sexual relationship may be continued unless your health care provider directs you otherwise. Relieving pain and discomfort  Wear a good support bra to prevent discomfort from breast tenderness.  Take warm sitz baths to soothe any pain or discomfort caused by hemorrhoids. Use hemorrhoid cream if your health care provider approves.  Rest with your legs elevated if you have leg cramps or low back pain.  If you develop varicose veins, wear support hose. Elevate your feet for 15 minutes, 3-4 times  a day. Limit salt in your diet. Prenatal Care  Write down your questions. Take them to your prenatal visits.  Keep all your prenatal visits as told by your health care provider. This is important. Safety  Wear your seat belt at all times when driving.  Make a list of emergency phone numbers, including numbers for family, friends, the hospital, and police and fire departments. General instructions  Ask your health care provider for a referral to a local prenatal education class. Begin classes no later than the beginning of month 6 of your pregnancy.  Ask for help if you have counseling or nutritional needs during pregnancy. Your health care provider can offer advice or refer you to specialists for help with various needs.  Do not use hot tubs, steam rooms, or saunas.  Do not douche or use tampons or scented sanitary pads.  Do not cross your legs for long periods of time.  Avoid cat litter boxes and soil used by cats. These carry germs that can cause birth defects  in the baby and possibly loss of the fetus by miscarriage or stillbirth.  Avoid all smoking, herbs, alcohol, and unprescribed drugs. Chemicals in these products can affect the formation and growth of the baby.  Do not use any products that contain nicotine or tobacco, such as cigarettes and e-cigarettes. If you need help quitting, ask your health care provider.  Visit your dentist if you have not gone yet during your pregnancy. Use a soft toothbrush to brush your teeth and be gentle when you floss. Contact a health care provider if:  You have dizziness.  You have mild pelvic cramps, pelvic pressure, or nagging pain in the abdominal area.  You have persistent nausea, vomiting, or diarrhea.  You have a bad smelling vaginal discharge.  You have pain when you urinate. Get help right away if:  You have a fever.  You are leaking fluid from your vagina.  You have spotting or bleeding from your vagina.  You have severe abdominal cramping or pain.  You have rapid weight gain or weight loss.  You have shortness of breath with chest pain.  You notice sudden or extreme swelling of your face, hands, ankles, feet, or legs.  You have not felt your baby move in over an hour.  You have severe headaches that do not go away when you take medicine.  You have vision changes. Summary  The second trimester is from week 14 through week 27 (months 4 through 6). It is also a time when the fetus is growing rapidly.  Your body goes through many changes during pregnancy. The changes vary from woman to woman.  Avoid all smoking, herbs, alcohol, and unprescribed drugs. These chemicals affect the formation and growth your baby.  Do not use any tobacco products, such as cigarettes, chewing tobacco, and e-cigarettes. If you need help quitting, ask your health care provider.  Contact your health care provider if you have any questions. Keep all prenatal visits as told by your health care provider. This  is important. This information is not intended to replace advice given to you by your health care provider. Make sure you discuss any questions you have with your health care provider. Document Revised: 10/17/2018 Document Reviewed: 07/31/2016 Elsevier Patient Education  2020 [employer].

## 2020-05-24 NOTE — Telephone Encounter (Signed)
Spoke with Selena Batten from [employer] to schedule their patient for [place] MFM OB DETAIL + 14 WK. Patient was referred by Sharen Counter.   Was able to get the patient scheduled for 06/13/20 at 9:15am. She will notify the patient of the appointment information.

## 2020-06-03 LAB — AFP, SERUM, OPEN SPINA BIFIDA
AFP MoM: 0.59
AFP Value: 17.4 ng/mL
Gest. Age on Collection Date: 15.9 weeks
Maternal Age At EDD: 28.6 yr
OSBR Risk 1 IN: 10000
Test Results:: NEGATIVE
Weight: 213 [lb_av]

## 2020-06-13 ENCOUNTER — Other Ambulatory Visit: Payer: Self-pay | Admitting: *Deleted

## 2020-06-13 ENCOUNTER — Ambulatory Visit: Payer: Medicaid Other | Attending: Advanced Practice Midwife

## 2020-06-13 ENCOUNTER — Encounter: Payer: Self-pay | Admitting: *Deleted

## 2020-06-13 ENCOUNTER — Other Ambulatory Visit: Payer: Self-pay

## 2020-06-13 ENCOUNTER — Ambulatory Visit: Payer: Medicaid Other | Admitting: *Deleted

## 2020-06-13 VITALS — BP 123/63 | HR 87

## 2020-06-13 DIAGNOSIS — O34219 Maternal care for unspecified type scar from previous cesarean delivery: Secondary | ICD-10-CM | POA: Insufficient documentation

## 2020-06-13 DIAGNOSIS — Z8632 Personal history of gestational diabetes: Secondary | ICD-10-CM

## 2020-06-13 DIAGNOSIS — Z363 Encounter for antenatal screening for malformations: Secondary | ICD-10-CM | POA: Insufficient documentation

## 2020-06-13 DIAGNOSIS — Z3A19 19 weeks gestation of pregnancy: Secondary | ICD-10-CM | POA: Diagnosis present

## 2020-06-13 DIAGNOSIS — Z98891 History of uterine scar from previous surgery: Secondary | ICD-10-CM | POA: Insufficient documentation

## 2020-06-13 DIAGNOSIS — O99212 Obesity complicating pregnancy, second trimester: Secondary | ICD-10-CM | POA: Insufficient documentation

## 2020-06-21 ENCOUNTER — Telehealth: Payer: Medicaid Other | Admitting: Obstetrics

## 2020-06-27 ENCOUNTER — Telehealth (INDEPENDENT_AMBULATORY_CARE_PROVIDER_SITE_OTHER): Payer: Medicaid Other | Admitting: Obstetrics

## 2020-06-27 ENCOUNTER — Encounter: Payer: Self-pay | Admitting: Obstetrics

## 2020-06-27 DIAGNOSIS — Z3A20 20 weeks gestation of pregnancy: Secondary | ICD-10-CM

## 2020-06-27 DIAGNOSIS — R109 Unspecified abdominal pain: Secondary | ICD-10-CM

## 2020-06-27 DIAGNOSIS — F17200 Nicotine dependence, unspecified, uncomplicated: Secondary | ICD-10-CM

## 2020-06-27 DIAGNOSIS — O99332 Smoking (tobacco) complicating pregnancy, second trimester: Secondary | ICD-10-CM

## 2020-06-27 DIAGNOSIS — O34219 Maternal care for unspecified type scar from previous cesarean delivery: Secondary | ICD-10-CM

## 2020-06-27 DIAGNOSIS — O26892 Other specified pregnancy related conditions, second trimester: Secondary | ICD-10-CM

## 2020-06-27 DIAGNOSIS — Z3480 Encounter for supervision of other normal pregnancy, unspecified trimester: Secondary | ICD-10-CM

## 2020-06-27 DIAGNOSIS — R102 Pelvic and perineal pain: Secondary | ICD-10-CM

## 2020-06-27 DIAGNOSIS — Z98891 History of uterine scar from previous surgery: Secondary | ICD-10-CM

## 2020-06-27 MED ORDER — COMFORT FIT MATERNITY SUPP SM MISC: 0 refills | Status: DC

## 2020-06-27 NOTE — Progress Notes (Deleted)
I have attempted without success to contact this patient by phone x 3.  I left  messages on answering machine to call Office [phone].

## 2020-06-27 NOTE — Progress Notes (Signed)
   TELEHEALTH OBSTETRICS VISIT ENCOUNTER NOTE  I connected with Julia Santiago on 06/27/20 at 11:15 AM EST by telephone at home and verified that I am speaking with the correct person using two identifiers.   I discussed the limitations, risks, security and privacy concerns of performing an evaluation and management service by telephone and the availability of in person appointments. I also discussed with the patient that there may be a patient responsible charge related to this service. The patient expressed understanding and agreed to proceed.  Subjective:  Julia Santiago is a 29 y.o. G3P2002 at [redacted]w[redacted]d being followed for ongoing prenatal care.  She is currently monitored for the following issues for this low-risk pregnancy and has History of 2 cesarean sections; Supervision of other normal pregnancy, antepartum; History of gestational diabetes; History of depression; and ASCUS with positive high risk HPV cervical on their problem list.  Patient reports round ligament pain. Reports fetal movement. Denies any contractions, bleeding or leaking of fluid.   The following portions of the patient's history were reviewed and updated as appropriate: allergies, current medications, past family history, past medical history, past social history, past surgical history and problem list.   Objective:   General:  Alert, oriented and cooperative.   Mental Status: Normal mood and affect perceived. Normal judgment and thought content.  Rest of physical exam deferred due to type of encounter  Assessment and Plan:  Pregnancy: G3P2002 at [redacted]w[redacted]d  1. Supervision of other normal pregnancy, antepartum  2. History of 2 cesarean sections  3. Pain of round ligament affecting pregnancy, antepartum Rx: - Elastic Bandages & Supports (COMFORT FIT MATERNITY SUPP SM) MISC; Wear as directed.  Dispense: 1 each; Refill: 0  4. Tobacco dependence - patient is trying to quit   Preterm labor symptoms and general  obstetric precautions including but not limited to vaginal bleeding, contractions, leaking of fluid and fetal movement were reviewed in detail with the patient.  I discussed the assessment and treatment plan with the patient. The patient was provided an opportunity to ask questions and all were answered. The patient agreed with the plan and demonstrated an understanding of the instructions. The patient was advised to call back or seek an in-person office evaluation/go to [facility]U at [facility] for any urgent or concerning symptoms. Please refer to After Visit Summary for other counseling recommendations.   I provided 15 minutes of non-face-to-face time during this encounter.  Return in about 4 weeks (around 07/25/2020) for MyChart.  Future Appointments  Date Time Provider Department Center  07/12/2020 10:00 AM [facility] NURSE [facility]  07/12/2020 10:15 AM [facility] US2 [facility]    Coral Ceo, MD Center for [facility] 06/27/20

## 2020-07-12 ENCOUNTER — Ambulatory Visit: Payer: Medicaid Other | Attending: Obstetrics and Gynecology

## 2020-07-12 ENCOUNTER — Encounter: Payer: Self-pay | Admitting: *Deleted

## 2020-07-12 ENCOUNTER — Ambulatory Visit: Payer: Medicaid Other | Admitting: *Deleted

## 2020-07-12 ENCOUNTER — Other Ambulatory Visit: Payer: Self-pay

## 2020-07-12 VITALS — BP 121/63 | HR 77

## 2020-07-12 DIAGNOSIS — Z8632 Personal history of gestational diabetes: Secondary | ICD-10-CM | POA: Insufficient documentation

## 2020-07-12 DIAGNOSIS — O99212 Obesity complicating pregnancy, second trimester: Secondary | ICD-10-CM

## 2020-07-12 DIAGNOSIS — E669 Obesity, unspecified: Secondary | ICD-10-CM | POA: Diagnosis not present

## 2020-07-12 DIAGNOSIS — Z3A23 23 weeks gestation of pregnancy: Secondary | ICD-10-CM

## 2020-07-12 DIAGNOSIS — O34219 Maternal care for unspecified type scar from previous cesarean delivery: Secondary | ICD-10-CM | POA: Diagnosis present

## 2020-07-12 DIAGNOSIS — O99332 Smoking (tobacco) complicating pregnancy, second trimester: Secondary | ICD-10-CM

## 2020-07-12 DIAGNOSIS — F17200 Nicotine dependence, unspecified, uncomplicated: Secondary | ICD-10-CM

## 2020-07-12 DIAGNOSIS — Z362 Encounter for other antenatal screening follow-up: Secondary | ICD-10-CM

## 2020-07-12 DIAGNOSIS — O09292 Supervision of pregnancy with other poor reproductive or obstetric history, second trimester: Secondary | ICD-10-CM | POA: Diagnosis not present

## 2020-08-22 ENCOUNTER — Other Ambulatory Visit: Payer: Self-pay

## 2020-08-22 ENCOUNTER — Telehealth: Payer: Self-pay

## 2020-08-22 DIAGNOSIS — R4589 Other symptoms and signs involving emotional state: Secondary | ICD-10-CM

## 2020-08-22 DIAGNOSIS — F329 Major depressive disorder, single episode, unspecified: Secondary | ICD-10-CM

## 2020-08-22 NOTE — Telephone Encounter (Addendum)
Pt sent a Mychart message on Friday after hours, c/o "feeling like something is coming out." Recommended patient to go to the hospital. Pt declined. She feels pressure no pain, denies ctx's, denies VB, +FM. Pt aware if sx's worsens, to contact the office during business office or report to MAU after hours.  Pt not seen since 06/27/20. She needs an appt ASAP for ROB, 2 gtt and discuss anxiety/depression. Pt denies suicidal/homicial thoughts.   Appt desk notified

## 2020-08-23 ENCOUNTER — Ambulatory Visit (INDEPENDENT_AMBULATORY_CARE_PROVIDER_SITE_OTHER): Payer: Medicaid Other | Admitting: Licensed Clinical Social Worker

## 2020-08-23 ENCOUNTER — Encounter: Payer: Self-pay | Admitting: Obstetrics and Gynecology

## 2020-08-23 ENCOUNTER — Ambulatory Visit (INDEPENDENT_AMBULATORY_CARE_PROVIDER_SITE_OTHER): Payer: Medicaid Other | Admitting: Obstetrics and Gynecology

## 2020-08-23 ENCOUNTER — Other Ambulatory Visit: Payer: Self-pay

## 2020-08-23 ENCOUNTER — Other Ambulatory Visit: Payer: Self-pay | Admitting: Obstetrics and Gynecology

## 2020-08-23 VITALS — BP 120/73 | HR 84 | Wt 219.3 lb

## 2020-08-23 DIAGNOSIS — F419 Anxiety disorder, unspecified: Secondary | ICD-10-CM

## 2020-08-23 DIAGNOSIS — O99340 Other mental disorders complicating pregnancy, unspecified trimester: Secondary | ICD-10-CM | POA: Diagnosis not present

## 2020-08-23 DIAGNOSIS — Z98891 History of uterine scar from previous surgery: Secondary | ICD-10-CM

## 2020-08-23 DIAGNOSIS — Z3A00 Weeks of gestation of pregnancy not specified: Secondary | ICD-10-CM | POA: Diagnosis not present

## 2020-08-23 DIAGNOSIS — Z8632 Personal history of gestational diabetes: Secondary | ICD-10-CM

## 2020-08-23 DIAGNOSIS — Z3A28 28 weeks gestation of pregnancy: Secondary | ICD-10-CM

## 2020-08-23 DIAGNOSIS — Z3480 Encounter for supervision of other normal pregnancy, unspecified trimester: Secondary | ICD-10-CM

## 2020-08-23 MED ORDER — SERTRALINE HCL 50 MG PO TABS
50.0000 mg | ORAL_TABLET | Freq: Every day | ORAL | 5 refills | Status: DC
Start: 2020-08-23 — End: 2020-09-07

## 2020-08-23 MED ORDER — PREPLUS 27-1 MG PO TABS: 1.0000 | ORAL_TABLET | Freq: Every day | ORAL | 13 refills | Status: DC

## 2020-08-23 NOTE — Patient Instructions (Signed)

## 2020-08-23 NOTE — BH Specialist Note (Signed)
Integrated Behavioral Health via Telemedicine Visit  08/23/2020 [name] [id number]  Number of Integrated Behavioral Health visits: 1/6 Session Start time: 9:11am  Session End time: 9:35am Total time: 24 mins via phone   Referring Provider: Hazle Coca MD  Patient/Family location: Home  [facility] Provider location: [facility]  All persons participating in visit: LCSWA A. Felton Clinton and Pt Julia Santiago  Types of Service: General Behavioral Integrated Care (BHI)  I connected with Tearah Julia Vandrunen and/or Toya Julia Pellman's  n/a by Telephone  (Video is Caregility application) and verified that I am speaking with the correct person using two identifiers.Discussed confidentiality: Yes   I discussed the limitations of telemedicine and the availability of in person appointments.  Discussed there is a possibility of technology failure and discussed alternative modes of communication if that failure occurs.  I discussed that engaging in this telemedicine visit, they consent to the provision of behavioral healthcare and the services will be billed under their insurance.  Patient and/or legal guardian expressed understanding and consented to Telemedicine visit: Yes   Presenting Concerns: Patient and/or family reports the following symptoms/concerns: anxiety affecting pregnancy  Duration of problem: approx two months ; Severity of problem: moderate  Patient and/or Family's Strengths/Protective Factors: Parental Resilience  Goals Addressed: Patient will: 1.  Reduce symptoms of: anxiety  2.  Increase knowledge and/or ability of: healthy habits and coping skills  3.  Demonstrate ability to: Increase healthy adjustment to current life circumstances  Progress towards Goals: Ongoing  Interventions: Interventions utilized:  CBT Cognitive Behavioral Therapy Standardized Assessments completed: N/A   Assessment: Patient currently experiencing anxiety affecting preganancy  Patient may benefit from  integrated behavioral health   Plan: 1. Follow up with behavioral health clinician on : 3-4 weeks same day as ROB visit 2. Behavioral recommendations: meditation, mindfulness technique, prioritize rest (most important), incorporate positive self affirmations daily to boost mood. Ms. Monter requested anxiety medications to assist with symptoms. LCSWA A. Felton Clinton advised to discuss medication with medical provider during ROB visit.  3. Referral(s): n/a  I discussed the assessment and treatment plan with the patient and/or parent/guardian. They were provided an opportunity to ask questions and all were answered. They agreed with the plan and demonstrated an understanding of the instructions.   They were advised to call back or seek an in-person evaluation if the symptoms worsen or if the condition fails to improve as anticipated.  Gwyndolyn Saxon, LCSW

## 2020-08-23 NOTE — Progress Notes (Signed)
   PRENATAL VISIT NOTE  Subjective:  Julia Santiago is a 30 y.o. G3P2002 at [redacted]w[redacted]d being seen today for ongoing prenatal care.  She is currently monitored for the following issues for this high-risk pregnancy and has History of 2 cesarean sections; Supervision of other normal pregnancy, antepartum; History of gestational diabetes; History of depression; ASCUS with positive high risk HPV cervical; and [redacted] weeks gestation of pregnancy on their problem list.  Patient doing well with no acute concerns today. She reports moderate pelvic pressure.  Contractions: Not present. Vag. Bleeding: None.  Movement: Present. Denies leaking of fluid.   Discussed with pt the need for daily baby ASA due to risk factors.  PHQ9 score was 18 today Pt will need to reschedule for 28 week labs and 2 hour GTT  The following portions of the patient's history were reviewed and updated as appropriate: allergies, current medications, past family history, past medical history, past social history, past surgical history and problem list. Problem list updated.  Objective:   Vitals:   08/23/20 1039  BP: 120/73  Pulse: 84  Weight: 219 lb 4.8 oz (99.5 kg)    Fetal Status: Fetal Heart Rate (bpm): 150   Movement: Present     General:  Alert, oriented and cooperative. Patient is in no acute distress.  Skin: Skin is warm and dry. No rash noted.   Cardiovascular: Normal heart rate noted  Respiratory: Normal respiratory effort, no problems with respiration noted  Abdomen: Soft, gravid, appropriate for gestational age.  Pain/Pressure: Present     Pelvic: Cervical exam deferred        Extremities: Normal range of motion.  Edema: None  Mental Status:  Normal mood and affect. Normal behavior. Normal judgment and thought content.   Assessment and Plan:  Pregnancy: G3P2002 at [redacted]w[redacted]d  1. [redacted] weeks gestation of pregnancy 3rd trimester labs next week with 2 hour GTT  2. History of 2 cesarean sections Repeat c/s with BTL  3.  Supervision of other normal pregnancy, antepartum  - Prenatal Vit-Fe Fumarate-FA (PREPLUS) 27-1 MG TABS; Take 1 tablet by mouth daily.  Dispense: 30 tablet; Refill: 13  4. History of gestational diabetes 2 hour GTT in 1 week  5.  Increased depression screen score, will refer to behavorial health for further counseling  Preterm labor symptoms and general obstetric precautions including but not limited to vaginal bleeding, contractions, leaking of fluid and fetal movement were reviewed in detail with the patient.  Please refer to After Visit Summary for other counseling recommendations.   Return in about 2 weeks (around 09/06/2020) for ROB, in person.   Mariel Aloe, MD Faculty Attending Center for [facility]

## 2020-08-23 NOTE — Progress Notes (Signed)
Patient presents for ROB. Patient needs GTT. Patient complains of having increased vaginal pressure and right leg pain.

## 2020-08-30 ENCOUNTER — Other Ambulatory Visit: Payer: Medicaid Other

## 2020-09-06 ENCOUNTER — Encounter: Payer: Medicaid Other | Admitting: Advanced Practice Midwife

## 2020-09-07 ENCOUNTER — Ambulatory Visit (INDEPENDENT_AMBULATORY_CARE_PROVIDER_SITE_OTHER): Payer: Medicaid Other | Admitting: Obstetrics

## 2020-09-07 ENCOUNTER — Other Ambulatory Visit: Payer: Medicaid Other

## 2020-09-07 ENCOUNTER — Other Ambulatory Visit: Payer: Self-pay

## 2020-09-07 ENCOUNTER — Encounter: Payer: Self-pay | Admitting: Obstetrics

## 2020-09-07 VITALS — BP 127/73 | HR 78 | Wt 221.0 lb

## 2020-09-07 DIAGNOSIS — O99340 Other mental disorders complicating pregnancy, unspecified trimester: Secondary | ICD-10-CM

## 2020-09-07 DIAGNOSIS — F32A Depression, unspecified: Secondary | ICD-10-CM

## 2020-09-07 DIAGNOSIS — Z3480 Encounter for supervision of other normal pregnancy, unspecified trimester: Secondary | ICD-10-CM

## 2020-09-07 MED ORDER — SERTRALINE HCL 50 MG PO TABS: 50.0000 mg | ORAL_TABLET | Freq: Every day | ORAL | 5 refills | Status: DC

## 2020-09-07 NOTE — Progress Notes (Signed)
ROB 31w  CC: None  

## 2020-09-07 NOTE — Progress Notes (Signed)
Subjective:  Julia Santiago is a 30 y.o. G3P2002 at [redacted]w[redacted]d being seen today for ongoing prenatal care.  She is currently monitored for the following issues for this low-risk pregnancy and has History of 2 cesarean sections; Supervision of other normal pregnancy, antepartum; History of gestational diabetes; History of depression; ASCUS with positive high risk HPV cervical; and [redacted] weeks gestation of pregnancy on their problem list.  Patient reports worsening depression / anxiety.  Now crying spontaneously more often..  Contractions: Not present. Vag. Bleeding: None.  Movement: Present. Denies leaking of fluid.   The following portions of the patient's history were reviewed and updated as appropriate: allergies, current medications, past family history, past medical history, past social history, past surgical history and problem list. Problem list updated.  Objective:   Vitals:   09/07/20 0901  BP: 127/73  Pulse: 78  Weight: 221 lb (100.2 kg)    Fetal Status:     Movement: Present     General:  Alert, oriented and cooperative. Patient is in no acute distress.  Skin: Skin is warm and dry. No rash noted.   Cardiovascular: Normal heart rate noted  Respiratory: Normal respiratory effort, no problems with respiration noted  Abdomen: Soft, gravid, appropriate for gestational age. Pain/Pressure: Absent     Pelvic:  Cervical exam deferred        Extremities: Normal range of motion.  Edema: None  Mental Status: Normal mood and affect. Normal behavior. Normal judgment and thought content.   Urinalysis:      Assessment and Plan:  Pregnancy: G3P2002 at [redacted]w[redacted]d  1. Supervision of other normal pregnancy, antepartum Rx: - CBC - Glucose Tolerance, 2 Hours w/1 Hour - RPR - HIV Antibody (routine testing w rflx)  2. Depression affecting pregnancy, antepartum Rx: - sertraline (ZOLOFT) 50 MG tablet; Take 1 tablet (50 mg total) by mouth daily.  Dispense: 30 tablet; Refill: 5   Preterm labor  symptoms and general obstetric precautions including but not limited to vaginal bleeding, contractions, leaking of fluid and fetal movement were reviewed in detail with the patient. Please refer to After Visit Summary for other counseling recommendations.   Return in about 2 weeks (around 09/21/2020) for ROB with midwife.   Brock Bad, MD  09/07/20

## 2020-09-08 ENCOUNTER — Other Ambulatory Visit: Payer: Self-pay | Admitting: Obstetrics

## 2020-09-08 DIAGNOSIS — O99019 Anemia complicating pregnancy, unspecified trimester: Secondary | ICD-10-CM

## 2020-09-08 LAB — CBC
Hematocrit: 32.3 % — ABNORMAL LOW (ref 34.0–46.6)
Hemoglobin: 10.7 g/dL — ABNORMAL LOW (ref 11.1–15.9)
MCH: 29.1 pg (ref 26.6–33.0)
MCHC: 33.1 g/dL (ref 31.5–35.7)
MCV: 88 fL (ref 79–97)
Platelets: 299 10*3/uL (ref 150–450)
RBC: 3.68 x10E6/uL — ABNORMAL LOW (ref 3.77–5.28)
RDW: 12 % (ref 11.7–15.4)
WBC: 10.4 10*3/uL (ref 3.4–10.8)

## 2020-09-08 LAB — HIV ANTIBODY (ROUTINE TESTING W REFLEX): HIV Screen 4th Generation wRfx: NONREACTIVE

## 2020-09-08 LAB — RPR: RPR Ser Ql: NONREACTIVE

## 2020-09-08 LAB — GLUCOSE TOLERANCE, 2 HOURS W/ 1HR
Glucose, 1 hour: 155 mg/dL (ref 65–179)
Glucose, 2 hour: 81 mg/dL (ref 65–152)
Glucose, Fasting: 82 mg/dL (ref 65–91)

## 2020-09-08 MED ORDER — FERROUS SULFATE 325 (65 FE) MG PO TABS: 325.0000 mg | ORAL_TABLET | ORAL | 5 refills | Status: DC

## 2020-09-21 ENCOUNTER — Other Ambulatory Visit: Payer: Self-pay

## 2020-09-21 ENCOUNTER — Ambulatory Visit (INDEPENDENT_AMBULATORY_CARE_PROVIDER_SITE_OTHER): Payer: Medicaid Other | Admitting: Women's Health

## 2020-09-21 VITALS — BP 115/71 | HR 88 | Wt 220.0 lb

## 2020-09-21 DIAGNOSIS — Z98891 History of uterine scar from previous surgery: Secondary | ICD-10-CM

## 2020-09-21 DIAGNOSIS — Z3480 Encounter for supervision of other normal pregnancy, unspecified trimester: Secondary | ICD-10-CM

## 2020-09-21 DIAGNOSIS — R87810 Cervical high risk human papillomavirus (HPV) DNA test positive: Secondary | ICD-10-CM

## 2020-09-21 DIAGNOSIS — O99333 Smoking (tobacco) complicating pregnancy, third trimester: Secondary | ICD-10-CM | POA: Insufficient documentation

## 2020-09-21 DIAGNOSIS — Z3A33 33 weeks gestation of pregnancy: Secondary | ICD-10-CM

## 2020-09-21 DIAGNOSIS — R87610 Atypical squamous cells of undetermined significance on cytologic smear of cervix (ASC-US): Secondary | ICD-10-CM

## 2020-09-21 DIAGNOSIS — Z8659 Personal history of other mental and behavioral disorders: Secondary | ICD-10-CM

## 2020-09-21 NOTE — Patient Instructions (Addendum)
Maternity Assessment Unit (MAU)  The Maternity Assessment Unit (MAU) is located at the [facility]. The address is: [address] [employer], [place], [place] [address]. Please see map below for additional directions.    The Maternity Assessment Unit is designed to help you during your pregnancy, and for up to 6 weeks after delivery, with any pregnancy- or postpartum-related emergencies, if you think you are in labor, or if your water has broken. For example, if you experience nausea and vomiting, vaginal bleeding, severe abdominal or pelvic pain, elevated blood pressure or other problems related to your pregnancy or postpartum time, please come to the Maternity Assessment Unit for assistance.        Perinatal Depression When a woman feels excessive sadness, anger, or anxiety during pregnancy or during the first 12 months after she gives birth, she has a condition called perinatal depression. This can interfere with work, school, relationships, and other everyday activities. If it is not managed properly, it can also interfere with the woman's ability to take care of the baby. Symptoms of perinatal depression may feel worse when living with a newborn. Sometimes, these symptoms are left untreated because they are thought to be normal mood swings during and right after pregnancy. However, if you have intense symptoms of depression that last for more than 2 weeks, it is important to talk with your health care provider. This may be perinatal depression. What are the causes? The exact cause of this condition is not known. Hormonal changes during and after pregnancy may play a role in causing perinatal depression. What increases the risk? You are more likely to develop this condition if:  You have a personal or family history of depression, anxiety, or mood disorders.  You experience a stressful life event during pregnancy, such as the death of a loved  one.  You have additional life stress, such as being a single parent.  You do not have support from family members or loved ones, or you are in an abusive relationship.  You have thyroid problems. What are the signs or symptoms? Symptoms of this condition include:  Emotional symptoms, such as: ? Feeling sad or hopeless. ? Feelings of guilt. ? Feeling irritable or overwhelmed.  Physical symptoms, such as: ? Changes with appetite or sleep. ? Lack of energy or motivation. ? Persistent headaches or stomach problems.  Behavioral symptoms, such as: ? Difficulty concentrating or completing tasks. ? Loss of interest in hobbies or relationships. How is this diagnosed? This condition is diagnosed based on a physical exam and mental evaluation.  In some cases, your health care provider may use a depression screening tool. This includes a list of questions that can help a health care provider diagnose depression.  You may be referred to a mental health expert who specializes in treating perinatal depression. How is this treated? This condition may be treated with:  Talk therapy with a mental health professional. This may be interpersonal psychotherapy, couples therapy, cognitive behavioral therapy, or mother-child bonding therapy.  Medicines. Your health care provider will discuss the safety of the medicines prescribed during pregnancy and breastfeeding.  Support groups.  Brain stimulation or light therapies.  Stress reduction therapies, such as mindfulness.   Follow these instructions at home: Lifestyle  Do not use any products that contain nicotine or tobacco. These products include cigarettes, chewing tobacco, and vaping devices, such as e-cigarettes. If you need help quitting, ask your health care provider.  Do  not drink alcohol when you are pregnant. It is also safest not to drink alcohol if you are breastfeeding.  After your baby is born, if you drink alcohol: ? Limit how  much you have to 0-1 drink a day. ? Be aware of how much alcohol is in your drink. In the [place] one drink equals one 12 oz bottle of beer (355 mL), one 5 oz glass of wine (148 mL), or one 1 oz glass of hard liquor (44 mL).  Consider joining a support group for new mothers. Ask your health care provider for recommendations.  Take good care of yourself. Make sure you: ? Get as much sleep as possible. Talk with your partner about sharing the responsibility of getting up with your baby if possible and sharing child care responsibilities equally. Make sleep a priority. ? Eat a healthy diet. This includes plenty of fruits and vegetables, whole grains, and lean proteins. ? Exercise regularly, as told by your health care provider. Ask your health care provider what exercises are safe for you. Talk with your partner about making sure you both have opportunities to exercise. General instructions  Take over-the-counter and prescription medicines only as told by your health care provider.  Talk with your partner or family members about your feelings during pregnancy. Share any concerns, needs, or anxieties that you may have. Do not be afraid to ask for help. Find a mental health professional, if needed.  Ask for help with tasks or chores when you need it. Ask friends and family members to provide meals, watch your children, or help with cleaning.  Keep all follow-up visits. This is important. Contact a health care provider if:  You or people close to you notice that you have symptoms of depression.  Your symptoms of depression get worse.  You take medicines and have side effects, such as nausea or sleep problems. Get help right away if:  You feel like hurting yourself, your baby, or someone else. If you feel like you may hurt yourself or others, or have thoughts about taking your own life, get help right away. You can go to your nearest emergency department or:  Call your local emergency services  (911 in the [place]  Call a suicide crisis helpline, such as the National Suicide Prevention Lifeline, at 567-582-3196. This is open 24 hours a day in the [place]  Text the Crisis Text Line at 858-521-7310 (in the [place] Summary  Perinatal depression is when a woman feels excessive sadness, anger, or anxiety during pregnancy or during the first 12 months after she gives birth.  If perinatal depression is not managed properly, it can interfere with the woman's ability to take care of the baby.  This condition is treated with medicines, talk therapy, stress reduction therapies, or a combination of treatments.  Talk with your partner or family members about your feelings. Ask for help when you need it. This information is not intended to replace advice given to you by your health care provider. Make sure you discuss any questions you have with your health care provider. Document Revised: 12/18/2019 Document Reviewed: 12/18/2019 Elsevier Patient Education  2021 Elsevier Inc.        Perinatal Mood and Anxiety Disorder Perinatal mood and anxiety disorder (PMAD) is a mental health condition that happens when a person feels excessive sadness, anger, or worry and tension (anxiety) during pregnancy or during the first few months after the birth. This condition can last a few months or may continue for  years if left untreated. PMAD may cause serious problems for the mother, her baby, or the father if not properly managed. Depression and anxiety can interfere with the ability to take care of the baby. It also may affect work, school, relationships, and other everyday activities. Having the baby blues is considered normal. Mild to moderate levels of sadness, exhaustion, and generally struggling with being a parent are considered the blues. Many parents experience these during the first 1-2 weeks after giving birth. If these symptoms become worse or last too long, it may be PMAD. What are the causes? The  exact cause of this condition is not known. It may result from a combination of hormone changes and biological, social, and psychological factors. What increases the risk? The following factors may make you more likely to develop this condition:  Having a personal or family history of depression, anxiety, or mood disorders.  Experiencing a stressful life event during pregnancy, such as the death of a loved one.  Having additional life stress, such as being a single parent.  Having thyroid problems. What are the signs or symptoms? Symptoms of this condition include:  Physical symptoms, such as: ? Panic attacks. These are intense episodes of fear or discomfort that may also cause sweating, nausea, shortness of breath, or fear of dying. They usually last 5-15 minutes but can last longer. ? Performing repetitive tasks to relieve stress or worry (obsessive compulsive disorder, or OCD). ? Problems with appetite or sleep.  Emotional symptoms, such as: ? Excessive worry about problems or feeling like something bad will happen (generalized anxiety disorder). ? Phobias, which are fears of certain objects or situations. ? Separation anxiety, or fear and stress about leaving certain people or loved ones.  Behavioral symptoms, such as: ? Depression, or lack of motivation and energy. ? Intense mood swings involving emotional highs and lows. ? Feeling out of control or like you are going crazy. ? Having difficulty bonding with your baby. Some people also have trouble relaxing, problems concentrating, problems sleeping, frequent nightmares, and disturbing thoughts. PMAD can be different for everyone and can affect men as well as women. How is this diagnosed? This condition is diagnosed based on a physical exam and mental evaluation.  In some cases, your health care provider may use an anxiety or depression screening tool. This includes a list of questions that can help a health care provider  diagnose PMAD.  You may be referred to a mental health expert who specializes in treating PMAD. How is this treated? This condition may be treated with:  Talk therapy with a mental health professional. This may be family therapy, marriage therapy, cognitive behavioral therapy, or interpersonal therapy.  Medicines. Your health care provider will discuss medicines that are safe to use during pregnancy and breastfeeding.  Stress reduction therapies, such as mindfulness, deep breathing, or guided muscle relaxation.  Support groups, early childhood education, or other groups to help with being a parent.   Follow these instructions at home: Lifestyle  Do not use any products that contain nicotine or tobacco. These products include cigarettes, chewing tobacco, and vaping devices, such as e-cigarettes. If you need help quitting, ask your health care provider.  Do not drink alcohol when you are pregnant. It is also safest not to drink alcohol if you are breastfeeding. ? After your baby is born, if you drink alcohol:  Limit how much you have to 0-1 drink a day.  Be aware of how much alcohol is in your  drink. In the [place] one drink equals one 12 oz bottle of beer (355 mL), one 5 oz glass of wine (148 mL), or one 1 oz glass of hard liquor (44 mL).  Consider joining a support group for new mothers. Ask your health care provider for recommendations.  Take good care of yourself. Make sure you: ? Get as much rest as possible. Talk with your partner about sharing the responsibility of getting up with your baby if possible. Make sleep a priority. ? Eat a healthy diet. This includes plenty of fruits and vegetables, whole grains, and lean proteins. ? Exercise regularly, as told by your health care provider. Ask your health care provider what exercises are safe for you. Talk with your partner about making sure you both have opportunities to exercise. General instructions  Take over-the-counter and  prescription medicines only as told by your health care provider.  Talk with your partner or family members about your feelings during pregnancy. Share your concerns, needs, or anxieties with each other. Do not be afraid to ask for help. Find a mental health professional, if needed.  Ask for help with tasks or chores when you need it. Ask friends and family members to provide meals, watch your children, or help with cleaning. If friends or family are not able to help, consider finding a licensed child care provider or professional house cleaner if needed. Let your partner know what you need. He or she may be struggling too.  Keep all follow-up visits. This is important. Contact a health care provider if:  You or people close to you notice that you have symptoms of anxiety or depression.  Your symptoms of anxiety or depression get worse.  You take medicines and have side effects that are uncomfortable or difficult to tolerate. Get help right away if:  You feel like hurting yourself, your baby, or someone else. If you feel like you may hurt yourself or others, or have thoughts about taking your own life, get help right away. Go to your nearest emergency department or:  Call your local emergency services (911 in the [place]  Call a suicide crisis helpline, such as the National Suicide Prevention Lifeline at 601-482-4044. This is open 24 hours a day in the [place]  Text the Crisis Text Line at 458 022 3809 (in the [place] Summary  Perinatal mood and anxiety disorder (PMAD) is when a woman or her partner feels excessive sadness, anger, or worry and tension (anxiety) during pregnancy or during the first few months after the birth.  PMAD may include depression, intense mood swings, panic attacks, separation anxiety, phobias, or generalized anxiety.  PMAD can cause problems for the mother, the baby, or the father if not properly managed.  This condition is treated with medicines, talk therapy, stress  reduction therapies, or a combination of treatments.  Talk with your partner or family members about your concerns or fears. Ask for help when you need it. This information is not intended to replace advice given to you by your health care provider. Make sure you discuss any questions you have with your health care provider. Document Revised: 12/18/2019 Document Reviewed: 12/18/2019 Elsevier Patient Education  2021 Elsevier Inc.        Postpartum Baby Blues The postpartum period begins right after the birth of a baby. During this time, there is often joy and excitement. It is also a time of many changes in the life of the parents. A mother may feel happy one minute and sad or  stressed the next. These feelings of sadness, called the baby blues, usually happen in the period right after the baby is born and go away within a week or two. What are the causes? The exact cause of this condition is not known. Changes in hormone levels after childbirth are believed to trigger some of the symptoms. Other factors that can play a role in these mood changes include:  Lack of sleep.  Stressful life events, such as financial problems, caring for a loved one, or death of a loved one.  Genetics. What are the signs or symptoms? Symptoms of this condition include:  Changes in mood, such as going from extreme happiness to sadness.  A decrease in concentration.  Difficulty sleeping.  Crying spells and tearfulness.  Loss of appetite.  Irritability.  Anxiety. If these symptoms last for more than 2 weeks or become more severe, you may have postpartum depression. How is this diagnosed? This condition is diagnosed based on an evaluation of your symptoms. Your health care provider may use a screening tool that includes a list of questions to help identify a person with the baby blues or postpartum depression. How is this treated? The baby blues usually go away on their own in 1-2 weeks. Social  support is often what is needed. You will be encouraged to get adequate sleep and rest. Follow these instructions at home: Lifestyle  Get as much rest as you can. Take a nap when the baby sleeps.  Exercise regularly as told by your health care provider. Some women find yoga and walking to be helpful.  Eat a balanced and nourishing diet. This includes plenty of fruits and vegetables, whole grains, and lean proteins.  Do little things that you enjoy. Take a bubble bath, read your favorite magazine, or listen to your favorite music.  Avoid alcohol.  Ask for help with household chores, cooking, grocery shopping, or running errands. Do not try to do everything yourself. Consider hiring a postpartum doula to help. This is a professional who specializes in providing support to new mothers.  Try not to make any major life changes during pregnancy or right after giving birth. This can add stress.      General instructions  Talk to people close to you about how you are feeling. Get support from your partner, family members, friends, or other new moms. You may want to join a support group.  Find ways to manage stress. This may include: ? Writing your thoughts and feelings in a journal. ? Spending time outside. ? Spending time with people who make you laugh.  Try to stay positive in how you think. Think about the things you are grateful for.  Take over-the-counter and prescription medicines only as told by your health care provider.  Let your health care provider know if you have any concerns.  Keep all postpartum visits. This is important. Contact a health care provider if:  Your baby blues do not go away after 2 weeks. Get help right away if:  You have thoughts of taking your own life (suicidal thoughts), or of harming your baby or someone else.  You see or hear things that are not there (hallucinations). If you ever feel like you may hurt yourself or others, or have thoughts about  taking your own life, get help right away. Go to your nearest emergency department or:  Call your local emergency services (911 in the [place]  Call a suicide crisis helpline, such as the National Suicide Prevention  Lifeline, at (939)290-5023. This is open 24 hours a day in the [place]  Text the Crisis Text Line at 601-615-4810 (in the [place] Summary  After giving birth, you may feel happy one minute and sad or stressed the next. Feelings of sadness that happen right after the baby is born and go away after a week or two are called the baby blues.  You can manage the baby blues by getting enough rest, eating a healthy diet, exercising, spending time with supportive people, and finding ways to manage stress.  If feelings of sadness and stress last longer than 2 weeks or get in the way of caring for your baby, talk with your health care provider. This may mean you have postpartum depression. This information is not intended to replace advice given to you by your health care provider. Make sure you discuss any questions you have with your health care provider. Document Revised: 12/18/2019 Document Reviewed: 12/18/2019 Elsevier Patient Education  2021 Elsevier Inc.       Tdap (Tetanus, Diphtheria, Pertussis) Vaccine: What You Need to Know 1. Why get vaccinated? Tdap vaccine can prevent tetanus, diphtheria, and pertussis. Diphtheria and pertussis spread from person to person. Tetanus enters the body through cuts or wounds.  TETANUS (T) causes painful stiffening of the muscles. Tetanus can lead to serious health problems, including being unable to open the mouth, having trouble swallowing and breathing, or death.  DIPHTHERIA (D) can lead to difficulty breathing, heart failure, paralysis, or death.  PERTUSSIS (aP), also known as "whooping cough," can cause uncontrollable, violent coughing that makes it hard to breathe, eat, or drink. Pertussis can be extremely serious especially in babies and young  children, causing pneumonia, convulsions, brain damage, or death. In teens and adults, it can cause weight loss, loss of bladder control, passing out, and rib fractures from severe coughing. 2. Tdap vaccine Tdap is only for children 7 years and older, adolescents, and adults.  Adolescents should receive a single dose of Tdap, preferably at age 61 or 12 years. Pregnant people should get a dose of Tdap during every pregnancy, preferably during the early part of the third trimester, to help protect the newborn from pertussis. Infants are most at risk for severe, life-threatening complications from pertussis. Adults who have never received Tdap should get a dose of Tdap. Also, adults should receive a booster dose of either Tdap or Td (a different vaccine that protects against tetanus and diphtheria but not pertussis) every 10 years, or after 5 years in the case of a severe or dirty wound or burn. Tdap may be given at the same time as other vaccines. 3. Talk with your health care provider Tell your vaccine provider if the person getting the vaccine:  Has had an allergic reaction after a previous dose of any vaccine that protects against tetanus, diphtheria, or pertussis, or has any severe, life-threatening allergies  Has had a coma, decreased level of consciousness, or prolonged seizures within 7 days after a previous dose of any pertussis vaccine (DTP, DTaP, or Tdap)  Has seizures or another nervous system problem  Has ever had Guillain-Barr Syndrome (also called "GBS")  Has had severe pain or swelling after a previous dose of any vaccine that protects against tetanus or diphtheria In some cases, your health care provider may decide to postpone Tdap vaccination until a future visit. People with minor illnesses, such as a cold, may be vaccinated. People who are moderately or severely ill should usually wait until they recover  before getting Tdap vaccine.  Your health care provider can give you more  information. 4. Risks of a vaccine reaction  Pain, redness, or swelling where the shot was given, mild fever, headache, feeling tired, and nausea, vomiting, diarrhea, or stomachache sometimes happen after Tdap vaccination. People sometimes faint after medical procedures, including vaccination. Tell your provider if you feel dizzy or have vision changes or ringing in the ears.  As with any medicine, there is a very remote chance of a vaccine causing a severe allergic reaction, other serious injury, or death. 5. What if there is a serious problem? An allergic reaction could occur after the vaccinated person leaves the clinic. If you see signs of a severe allergic reaction (hives, swelling of the face and throat, difficulty breathing, a fast heartbeat, dizziness, or weakness), call 9-1-1 and get the person to the nearest hospital. For other signs that concern you, call your health care provider.  Adverse reactions should be reported to the Vaccine Adverse Event Reporting System (VAERS). Your health care provider will usually file this report, or you can do it yourself. Visit the VAERS website at www.vaers.LAgents.nohhs.gov or call (614)030-77821-208 107 7776. VAERS is only for reporting reactions, and VAERS staff members do not give medical advice. 6. The National Vaccine Injury Compensation Program The [employer] Vaccine Injury Compensation Program (VICP) is a federal program that was created to compensate people who may have been injured by certain vaccines. Claims regarding alleged injury or death due to vaccination have a time limit for filing, which may be as short as two years. Visit the VICP website at SpiritualWord.atwww.hrsa.gov/vaccinecompensation or call 463 394 56421-236 038 0562 to learn about the program and about filing a claim. 7. How can I learn more?  Ask your health care provider.  Call your local or state health department.  Visit the website of the Food and Drug Administration (FDA) for vaccine package inserts and additional  information at FinderList.nowww.fda.gov/vaccines-blood-biologics/vaccines.  Contact the Centers for Disease Control and Prevention (CDC): ? Call 717-854-53901-319-465-2930 (1-800-CDC-INFO) or ? Visit CDC's website at PicCapture.uywww.cdc.gov/vaccines. Vaccine Information Statement Tdap (Tetanus, Diphtheria, Pertussis) Vaccine (02/12/2020) This information is not intended to replace advice given to you by your health care provider. Make sure you discuss any questions you have with your health care provider. Document Revised: 03/09/2020 Document Reviewed: 03/09/2020 Elsevier Patient Education  2021 Elsevier Inc.       Childbirth Education Options: [facility]:  Childbirth education classes can help you get ready for a positive parenting experience. You can also meet other expectant parents and get free stuff for your baby. Each class runs for five weeks on the same night and costs $45 for the mother-to-be and her support person. Medicaid covers the cost if you are eligible. Call [phone] to register. [facility] Child[facility] Education:  [phone] or [phone] or [email or web address]  Baby & Me Class: Discuss newborn & infant parenting and family adjustment issues with other new mothers in a relaxed environment. Each week brings a new speaker or baby-centered activity. We encourage new mothers to join us every Thursday at 11:00am. Babies birth until crawling. No registration or fee. Daddy [employer]: This course offers Dads-to-be the tools and knowledge needed to feel confident on their journey to becoming new fathers. Experienced dads, who have been trained as coaches, teach dads-to-be how to hold, comfort, diaper, swaddle and play with their infant while being able to support the new mom as well. A class for men taught by men. $25/dad Big Brother/Big Sister: Let your  children share in the joy of a new brother or sister in this special class designed just for them. Class includes  discussion about how families care for babies: swaddling, holding, diapering, safety as well as how they can be helpful in their new role. This class is designed for children ages 2 to 55, but any age is welcome. Please register each child individually. $5/child  Mom Talk: This mom-led group offers support and connection to mothers as they journey through the adjustments and struggles of that sometimes overwhelming first year after the birth of a child. Tuesdays at 10:00am and Thursdays at 6:00pm. Babies welcome. No registration or fee. Breastfeeding Support Group: This group is a mother-to-mother support circle where moms have the opportunity to share their breastfeeding experiences. A Lactation Consultant is present for questions and concerns. Meets each Tuesday at 11:00am. No fee or registration. Breastfeeding Your Baby: Learn what to expect in the first days of breastfeeding your newborn.  This class will help you feel more confident with the skills needed to begin your breastfeeding experience. Many new mothers are concerned about breastfeeding after leaving the hospital. This class will also address the most common fears and challenges about breastfeeding during the first few weeks, months and beyond. (call for fee) Comfort Techniques and Tour: This 2 hour interactive class will provide you the opportunity to learn & practice hands-on techniques that can help relieve some of the discomfort of labor and encourage your baby to rotate toward the best position for birth. You and your partner will be able to try a variety of labor positions with birth balls and rebozos as well as practice breathing, relaxation, and visualization techniques. A tour of the [facility] is included with this class. $20 per registrant and support person Childbirth Class- Weekend Option: This class is a Weekend version of our Birth & Baby series. It is designed for parents who have a difficult time fitting  several weeks of classes into their schedule. It covers the care of your newborn and the basics of labor and childbirth. It also includes a Maternity Care Center Tour of [facility] and lunch. The class is held two consecutive days: beginning on Friday evening from 6:30 - 8:30 p.m. and the next day, Saturday from 9 a.m. - 4 p.m. (call for fee) Linden Dolin Class: Interested in a waterbirth?  This informational class will help you discover whether waterbirth is the right fit for you. Education about waterbirth itself, supplies you would need and how to assemble your support team is what you can expect from this class. Some obstetrical practices require this class in order to pursue a waterbirth. (Not all obstetrical practices offer waterbirth-check with your healthcare provider.) Register only the expectant mom, but you are encouraged to bring your partner to class! Required if planning waterbirth, no fee. Infant/Child CPR: Parents, grandparents, babysitters, and friends learn Cardio-Pulmonary Resuscitation skills for infants and children. You will also learn how to treat both conscious and unconscious choking in infants and children. This Family & Friends program does not offer certification. Register each participant individually to ensure that enough mannequins are available. (Call for fee) Grandparent Love: Expecting a grandbaby? This class is for you! Learn about the latest infant care and safety recommendations and ways to support your own child as he or she transitions into the parenting role. Taught by Registered Nurses who are childbirth instructors, but most importantly...they are grandmothers too! $10/person. Childbirth Class- Natural Childbirth: This series of 5 weekly  classes is for expectant parents who want to learn and practice natural methods of coping with the process of labor and childbirth. Relaxation, breathing, massage, visualization, role of the partner, and helpful positioning are  highlighted. Participants learn how to be confident in their body's ability to give birth. This class will empower and help parents make informed decisions about their own care. Includes discussion that will help new parents transition into the immediate postpartum period. Maternity Care Center Tour of [facility] is included. We suggest taking this class between 25-32 weeks, but it's only a recommendation. $75 per registrant and one support person or $30 Medicaid. Childbirth Class- 3 week Series: This option of 3 weekly classes helps you and your labor partner prepare for childbirth. Newborn care, labor & birth, cesarean birth, pain management, and comfort techniques are discussed and a Maternity Care Center Tour of [facility] is included. The class meets at the same time, on the same day of the week for 3 consecutive weeks beginning with the starting date you choose. $60 for registrant and one support person.  Marvelous Multiples: Expecting twins, triplets, or more? This class covers the differences in labor, birth, parenting, and breastfeeding issues that face multiples' parents. NICU tour is included. Led by a Certified Childbirth Educator who is the mother of twins. No fee. Caring for Baby: This class is for expectant and adoptive parents who want to learn and practice the most up-to-date newborn care for their babies. Focus is on birth through the first six weeks of life. Topics include feeding, bathing, diapering, crying, umbilical cord care, circumcision care and safe sleep. Parents learn to recognize symptoms of illness and when to call the pediatrician. Register only the mom-to-be and your partner or support person can plan to come with you! $10 per registrant and support person Childbirth Class- online option: This online class offers you the freedom to complete a Birth and Baby series in the comfort of your own home. The flexibility of this option allows you to review sections at your own  pace, at times convenient to you and your support people. It includes additional video information, animations, quizzes, and extended activities. Get organized with helpful eClass tools, checklists, and trackers. Once you register online for the class, you will receive an email within a few days to accept the invitation and begin the class when the time is right for you. The content will be available to you for 60 days. $60 for 60 days of online access for you and your support people.               Group B Streptococcus Test During Pregnancy Why am I having this test? Routine testing, also called screening, for group B streptococcus (GBS) is recommended for all pregnant women between the 36th and 37th week of pregnancy. GBS is a type of bacteria that can be passed from mother to baby during childbirth. Screening will help guide whether or not you will need treatment during labor and delivery to prevent complications such as:  An infection in your uterus during labor.  An infection in your uterus after delivery.  A serious infection in your baby after delivery, such as pneumonia, meningitis, or sepsis. GBS screening is not often done before 36 weeks of pregnancy unless you go into labor prematurely. What happens if I have group B streptococcus? If testing shows that you have GBS, your health care provider will recommend treatment with IV antibiotics during labor and delivery. This treatment significantly  decreases the risk of complications for you and your baby. If you have a planned C-section and you have GBS, you may not need to be treated with antibiotics because GBS is usually passed to babies after labor starts and your water breaks. If you are in labor or your water breaks before your C-section, it is possible for GBS to get into your uterus and be passed to your baby, so you might need treatment. Is there a chance I may not need to be tested? You may not need to be tested for GBS  if:  You have a urine test that shows GBS before 36 to 37 weeks.  You had a baby with GBS infection after a previous delivery. In these cases, you will automatically be treated for GBS during labor and delivery. What is being tested? This test is done to check if you have group B streptococcus in your vagina or rectum. What kind of sample is taken? To collect samples for this test, your health care provider will swab your vagina and rectum with a cotton swab. The sample is then sent to the lab to see if GBS is present. What happens during the test?  You will remove your clothing from the waist down.  You will lie down on an exam table in the same position as you would for a pelvic exam.  Your health care provider will swab your vagina and rectum to collect samples for a culture test.  You will be able to go home after the test and do all your usual activities.   How are the results reported? The test results are reported as positive or negative. What do the results mean?  A positive test means you are at risk for passing GBS to your baby during labor and delivery. Your health care provider will recommend that you are treated with an IV antibiotic during labor and delivery.  A negative test means you are at very low risk of passing GBS to your baby. There is still a low risk of passing GBS to your baby because sometimes test results may report that you do not have a condition when you do (false-negative result) or there is a chance that you may become infected with GBS after the test is done. You most likely will not need to be treated with an antibiotic during labor and delivery. Talk with your health care provider about what your results mean. Questions to ask your health care provider Ask your health care provider, or the department that is doing the test:  When will my results be ready?  How will I get my results?  What are my treatment options? Summary  Routine testing  (screening) for group B streptococcus (GBS) is recommended for all pregnant women between the 36th and 37th week of pregnancy.  GBS is a type of bacteria that can be passed from mother to baby during childbirth.  If testing shows that you have GBS, your health care provider will recommend that you are treated with IV antibiotics during labor and delivery. This treatment almost always prevents infection in newborns. This information is not intended to replace advice given to you by your health care provider. Make sure you discuss any questions you have with your health care provider. Document Revised: 04/26/2020 Document Reviewed: 07/23/2018 Elsevier Patient Education  2021 Elsevier Inc.        [email or web address]  Colposcopy  Colposcopy is a procedure to examine the lowest part of the uterus (cervix), the  vagina, and the area around the vaginal opening (vulva) for abnormalities or signs of disease. This procedure is done using an instrument that makes objects appear larger and provides light. (colposcope). During the procedure, the health care provider may remove a tissue sample to be looked at later under a microscope (biopsy). A biopsy may be done if any unusual cells are found during the colposcopy. You may have a colposcopy if you have:  An abnormal Pap smear, also called a Pap test. This screening test is used to check for signs of cancer or infection of the vagina, cervix, and uterus.  An HPV (human papillomavirus) test and get a positive result for a type of HPV that puts you at high risk of cancer.  Certain conditions or symptoms, such as: ? A sore, or lesion, on your cervix. ? Genital warts on your vulva, vagina, or cervix. ? Pain during sex. ? Vaginal bleeding, especially after sex.  A growth on your cervix (cervical polyp) that needs to be removed. Let your health care provider know about:  Any  allergies you have, including allergies to medicines, latex, or iodine.  All medicines you are taking, including vitamins, herbs, eye drops, creams, and over-the-counter medicines.  Any problems you or family members have had with anesthetic medicines.  Any blood disorders you have.  Any surgeries you have had.  Any medical conditions you have, such as pelvic inflammatory disease (PID) or endometrial disorder.  The pattern of your menstrual cycles and the form of birth control (contraception) you use, if any.  Your medical history, including any history of fainting often or of cervical treatment.  Whether you are pregnant or may be pregnant. What are the risks? Generally, this is a safe procedure. However, problems may occur, including:  Infection. Symptoms of infection may include fever, bad-smelling vaginal discharge, or pelvic pain.  Allergic reactions to medicines.  Damage to nearby structures or organs.  Fainting. This is rare. What happens before the procedure? Eating and drinking restrictions  Follow instructions from your health care provider about eating or drinking restrictions.  You will likely need to eat a regular diet the day of the procedure and not skip any meals. Tests  You may have an exam or testing. A pregnancy test will be done the day of the procedure.  You may have a blood or urine sample taken. General instructions  Ask your health care provider about: ? Changing or stopping your regular medicines. This is especially important if you are taking diabetes medicines or blood thinners. ? Taking medicines such as aspirin and ibuprofen. These medicines can thin your blood. Do not take these medicines unless your health care provider tells you to take them. Your health care provider will likely tell you to avoid taking aspirin, or medicine that contains aspirin, for 7 days before the procedure. ? Taking over-the-counter medicines, vitamins, herbs, and  supplements.  Tell your health care provider if you have your menstrual period now or will have it at the time of your procedure. A colposcopy is not normally done during your menstrual period.  If you use contraception, continue to use it before your procedure.  For 24 hours before the procedure: ? Do not use douche products or tampons. ? Do not use medicines, creams, or suppositories in the vagina. ? Do not have sex.  Ask your health care provider what steps will be taken to prevent infection. What happens during the procedure?  You will lie down on your back,  with your feet in foot rests (stirrups).  A tool called a speculum will be warmed and will have oil or gel put on it (will be lubricated). The speculum will then be inserted into your vagina. This will be used to hold apart the walls of your vagina so your health care provider can see your cervix and the inside of your vagina.  A cotton swab will be used to place a small amount of a liquid (solution) on the areas to be examined. This solution makes it easier to see abnormal cells. You may feel a slight burning during this part.  The colposcope will be used to scan the cervix with a bright white light. The colposcope will be held near your vulva and will make your vulva, vagina, and cervix look bigger so they can be seen better.  If a biopsy is needed: ? You may be given a medicine to numb the area (local anesthetic). ? Surgical tools will be used to remove mucus and cells through your vagina. ? You may feel mild pain while the tissue sample is removed. ? Bleeding may occur. A solution may be used to stop the bleeding. ? If a biopsy is needed from the inside of the cervix, a different procedure called endocervical curettage (ECC) may be done. During this procedure, a curved tool called a curette will be used to scrape cells from your cervix or the top of your cervix (endocervix).  Any abnormalities that are found will be  recorded. The procedure may vary among health care providers and hospitals. What happens after the procedure?  You will lie down and rest for a few minutes. You may be offered juice or cookies.  Your blood pressure, heart rate, breathing rate, and blood oxygen level will be monitored until you leave the hospital or clinic.  You may have some cramping in your abdomen. This should go away after a few minutes.  It is up to you to get the results of your procedure. Ask your health care provider, or the department that is doing the procedure, when your results will be ready. Summary  Colposcopy is a procedure to examine the lowest part of the uterus (cervix), the vagina, and the area around the vaginal opening (vulva) for abnormalities or signs of disease.  A biopsy may be done as part of the procedure.  After the procedure, you will remain lying down and will rest for a few minutes.  You may have some cramping in your abdomen. This should go away after a few minutes. This information is not intended to replace advice given to you by your health care provider. Make sure you discuss any questions you have with your health care provider. Document Revised: 06/24/2019 Document Reviewed: 06/24/2019 Elsevier Patient Education  2021 Elsevier Inc.        Pregnancy and Smoking  Smoking during pregnancy is unhealthy for you and your baby. Smoke from cigarettes, e-cigarettes, pipes, and cigars contains many chemicals that can cause cancer (carcinogens). These products also contain a stimulant drug (nicotine). When you smoke, harmful substances that you breathe in enter your bloodstream and can be passed on to your baby. This can affect your baby's development. If you are planning to become pregnant or have recently become pregnant, talk with your health care provider about quitting smoking. You have a much better chance of having a healthy pregnancy and a healthy baby if you do not smoke while you  are pregnant. How does smoking affect me?  Smoking increases your risk for many long-term (chronic) diseases. These diseases include cancer, lung diseases, and heart disease. Smoking during pregnancy increases your risk of:  Losing the pregnancy (miscarriage or stillbirth).  Giving birth too early (premature birth).  Pregnancy outside of the uterus (tubal pregnancy).  Having your water break before labor begins.  Problems with the placenta, which is the organ that provides the baby nourishment and oxygen. These problems may include: ? Attachment of the placenta over the opening of the uterus (placenta previa). ? Detachment of the placenta before the baby's birth (placental abruption). How does smoking affect my baby? Before birth Smoking during pregnancy:  Decreases blood flow and oxygen to your baby.  Affects your baby's brain and lungs.  Slows your baby's growth in the uterus (intrauterine growth retardation). After birth Babies born to women who smoked during pregnancy:  May have symptoms of nicotine withdrawal.  May be born with a cleft lip, cleft palate, or other facial deformities.  May be too small at birth.  May have a high risk of: ? Serious health problems or lifelong disabilities. This may require long-term need for certain medicines, therapies, or other treatments. ? Sudden infant death syndrome (SIDS). What can I do to lower my risk of problems from smoking?  Do not use any products that contain nicotine or tobacco. These products include cigarettes, chewing tobacco, and vaping devices, such as e-cigarettes. If you need help quitting, ask your health care provider.  Do not take nicotine supplements or medicine to help you quit smoking unless your health care provider tells you to do so.  Avoid secondhand smoke. Ask people who smoke to avoid smoking around you.  Identify people, places, things, and activities that make you want to smoke (triggers). Avoid  them.  Talk with your health care provider about support strategies to quit smoking. Some methods to consider include: ? Counseling (smoking cessation counseling). ? Psychotherapy. ? Acupuncture. ? Hypnosis. ? Telephone hotlines for people trying to quit.   Where to find more information Learn more about smoking during pregnancy and quitting smoking from:  March of Dimes: www.marchofdimes.org  [place]. Department of Health and Human Services: women.smokefree.gov  American Cancer Society: www.cancer.org  American Heart Association: www.heart.org  National Cancer Institute: www.cancer.gov For help to quit smoking:  National smoking cessation telephone hotline: 1-800-QUIT-NOW 618-836-0598) Contact a health care provider if:  You are struggling to quit smoking.  You are a smoker and you become pregnant or plan to become pregnant.  You start smoking again after giving birth. Summary  Smoking during pregnancy is unhealthy for you and your baby.  Tobacco smoke contains harmful substances that can affect a baby's health and development in the uterus.  Smoking increases the risk for serious problems, such as miscarriage, birth defects, or premature birth.  If you need help to quit smoking, talk to your health care provider and ask about support strategies such as counseling, hypnosis, acupuncture, or psychotherapy. This information is not intended to replace advice given to you by your health care provider. Make sure you discuss any questions you have with your health care provider. Document Revised: 03/18/2020 Document Reviewed: 03/18/2020 Elsevier Patient Education  2021 Elsevier Inc.        Preterm Labor The normal length of a pregnancy is 39-41 weeks. Preterm labor is when labor starts before 37 completed weeks of pregnancy. Babies who are born prematurely and survive may not be fully developed and may be at an increased risk for long-term  problems such as cerebral palsy,  developmental delays, and vision and hearing problems. Babies who are born too early may have problems soon after birth. Problems may include regulating blood sugar, body temperature, heart rate, and breathing rate. These babies often have trouble with feeding. The risk of having problems is highest for babies who are born before 34 weeks of pregnancy. What are the causes? The exact cause of this condition is not known. What increases the risk? You are more likely to have preterm labor if you have certain risk factors that relate to your medical history, problems with present and past pregnancies, and lifestyle factors. Medical history  You have abnormalities of the uterus, including a short cervix.  You have STIs (sexually transmitted infections), or other infections of the urinary tract and the vagina.  You have chronic illnesses, such as blood clotting problems, diabetes, or high blood pressure.  You are overweight or underweight. Present and past pregnancies  You have had preterm labor before.  You are pregnant with twins or other multiples.  You have been diagnosed with a condition in which the placenta covers your cervix (placenta previa).  You waited less than 6 months between giving birth and becoming pregnant again.  Your unborn baby has some abnormalities.  You have vaginal bleeding during pregnancy.  You became pregnant through in vitro fertilization (IVF). Lifestyle and environmental factors  You use tobacco products.  You drink alcohol.  You use street drugs.  You have stress and no social support.  You experience domestic violence.  You are exposed to certain chemicals or environmental pollutants. Other factors  You are younger than age 52 or older than age 64. What are the signs or symptoms? Symptoms of this condition include:  Cramps similar to those that can happen during a menstrual period. The cramps may happen with diarrhea.  Pain in the abdomen  or lower back.  Regular contractions that may feel like tightening of the abdomen.  A feeling of increased pressure in the pelvis.  Increased watery or bloody mucus discharge from the vagina.  Water breaking (ruptured amniotic sac). How is this diagnosed? This condition is diagnosed based on:  Your medical history and a physical exam.  A pelvic exam.  An ultrasound.  Monitoring your uterus for contractions.  Other tests, including: ? A swab of the cervix to check for a chemical called fetal fibronectin. ? Urine tests. How is this treated? Treatment for this condition depends on the length of your pregnancy, your condition, and the health of your baby. Treatment may include:  Taking medicines, such as: ? Hormone medicines. These may be given early in pregnancy to help support the pregnancy. ? Medicines to stop contractions. ? Medicines to help mature the baby's lungs. These may be prescribed if the risk of delivery is high. ? Medicines to prevent your baby from developing cerebral palsy.  Bed rest. If the labor happens before 34 weeks of pregnancy, you may need to stay in the hospital.  Delivery of the baby. Follow these instructions at home:  Do not use any products that contain nicotine or tobacco, such as cigarettes, e-cigarettes, and chewing tobacco. If you need help quitting, ask your health care provider.  Do not drink alcohol.  Take over-the-counter and prescription medicines only as told by your health care provider.  Rest as told by your health care provider.  Return to your normal activities as told by your health care provider. Ask your health care provider what activities  are safe for you.  Keep all follow-up visits as told by your health care provider. This is important.   How is this prevented? To increase your chance of having a full-term pregnancy:  Do not use street drugs or medicines that have not been prescribed to you during your  pregnancy.  Talk with your health care provider before taking any herbal supplements, even if you have been taking them regularly.  Make sure you gain a healthy amount of weight during your pregnancy.  Watch for infection. If you think that you might have an infection, get it checked right away. Symptoms of infection may include: ? Fever. ? Abnormal vaginal discharge or discharge that smells bad. ? Pain or burning with urination. ? Needing to urinate urgently. ? Frequently urinating or passing small amounts of urine frequently. ? Blood in your urine. ? Urine that smells bad or unusual.  Tell your health care provider if you have had preterm labor before. Contact a health care provider if:  You think you are going into preterm labor.  You have signs or symptoms of preterm labor.  You have symptoms of infection. Get help right away if:  You are having regular, painful contractions every 5 minutes or less.  Your water breaks. Summary  Preterm labor is labor that starts before you reach 37 weeks of pregnancy.  Delivering your baby early increases your baby's risk of developing lifelong problems.  The exact cause of preterm labor is unknown. However, having an abnormal uterus, an STI (sexually transmitted infection), or vaginal bleeding during pregnancy increases your risk for preterm labor.  Keep all follow-up visits as told by your health care provider. This is important.  Contact a health care provider if you have signs or symptoms of preterm labor. This information is not intended to replace advice given to you by your health care provider. Make sure you discuss any questions you have with your health care provider. Document Revised: 07/28/2019 Document Reviewed: 07/28/2019 Elsevier Patient Education  2021 [employer].

## 2020-09-21 NOTE — Progress Notes (Signed)
Subjective:  Julia Santiago is a 30 y.o. G3P2002 at [redacted]w[redacted]d being seen today for ongoing prenatal care.  She is currently monitored for the following issues for this low-risk pregnancy and has History of 2 cesarean sections; Supervision of other normal pregnancy, antepartum; History of gestational diabetes; History of depression; ASCUS with positive high risk HPV cervical; and High risk pregnancy due to smoking in third trimester on their problem list.  Patient reports no complaints.  Contractions: Not present. Vag. Bleeding: None.  Movement: Present. Denies leaking of fluid.   The following portions of the patient's history were reviewed and updated as appropriate: allergies, current medications, past family history, past medical history, past social history, past surgical history and problem list. Problem list updated.  Objective:   Vitals:   09/21/20 1006  BP: 115/71  Pulse: 88  Weight: 220 lb (99.8 kg)    Fetal Status: Fetal Heart Rate (bpm): 152 Fundal Height: 33 cm Movement: Present     General:  Alert, oriented and cooperative. Patient is in no acute distress.  Skin: Skin is warm and dry. No rash noted.   Cardiovascular: Normal heart rate noted  Respiratory: Normal respiratory effort, no problems with respiration noted  Abdomen: Soft, gravid, appropriate for gestational age. Pain/Pressure: Present     Pelvic: Vag. Bleeding: None     Cervical exam deferred        Extremities: Normal range of motion.  Edema: None  Mental Status: Normal mood and affect. Normal behavior. Normal judgment and thought content.   Urinalysis:      Assessment and Plan:  Pregnancy: G3P2002 at [redacted]w[redacted]d  1. Supervision of other normal pregnancy, antepartum -BTL papers signed 05/24/2020 -CBE info given -GBS next visit  2. History of 2 cesarean sections  3. History of depression -pt reports was prescribed sertraline at last visit, has not yet picked up and started medications, but will do so before  next visit  4. ASCUS with positive high risk HPV cervical -needs colpo ppartum per consultation with Dr. Debroah Loop  5. [redacted] weeks gestation of pregnancy  6. High risk pregnancy due to smoking in third trimester -pt reports quitting about 2 months ago  Preterm labor symptoms and general obstetric precautions including but not limited to vaginal bleeding, contractions, leaking of fluid and fetal movement were reviewed in detail with the patient. I discussed the assessment and treatment plan with the patient. The patient was provided an opportunity to ask questions and all were answered. The patient agreed with the plan and demonstrated an understanding of the instructions. The patient was advised to call back or seek an in-person office evaluation/go to MAU at [facility] for any urgent or concerning symptoms. Please refer to After Visit Summary for other counseling recommendations.  Return in about 3 weeks (around 10/12/2020) for in-person LOB/GBS/cultures/APP OK.   Bohdi Leeds, Odie Sera, NP

## 2020-09-21 NOTE — Progress Notes (Signed)
+   Fetal movement. No complaints.  

## 2020-10-12 ENCOUNTER — Telehealth: Payer: Medicaid Other | Admitting: Women's Health

## 2020-10-14 ENCOUNTER — Other Ambulatory Visit: Payer: Self-pay

## 2020-10-14 ENCOUNTER — Encounter: Payer: Self-pay | Admitting: Obstetrics and Gynecology

## 2020-10-14 ENCOUNTER — Other Ambulatory Visit (HOSPITAL_COMMUNITY)
Admission: RE | Admit: 2020-10-14 | Discharge: 2020-10-14 | Disposition: A | Discharge status: Home / Self Care | Payer: Medicaid Other | Source: Ambulatory Visit | Attending: Obstetrics and Gynecology | Admitting: Obstetrics and Gynecology

## 2020-10-14 ENCOUNTER — Ambulatory Visit (INDEPENDENT_AMBULATORY_CARE_PROVIDER_SITE_OTHER): Payer: Medicaid Other | Admitting: Obstetrics and Gynecology

## 2020-10-14 VITALS — Wt 224.1 lb

## 2020-10-14 DIAGNOSIS — Z3480 Encounter for supervision of other normal pregnancy, unspecified trimester: Secondary | ICD-10-CM

## 2020-10-14 DIAGNOSIS — Z3483 Encounter for supervision of other normal pregnancy, third trimester: Secondary | ICD-10-CM | POA: Diagnosis not present

## 2020-10-14 DIAGNOSIS — Z98891 History of uterine scar from previous surgery: Secondary | ICD-10-CM

## 2020-10-14 DIAGNOSIS — Z3009 Encounter for other general counseling and advice on contraception: Secondary | ICD-10-CM

## 2020-10-14 DIAGNOSIS — Z23 Encounter for immunization: Secondary | ICD-10-CM | POA: Diagnosis not present

## 2020-10-14 NOTE — Progress Notes (Signed)
Patient presents for ROB and GTT. Patient declines any vaginal discharge, odor, or irritation.  PHQ 2-9 score: 10

## 2020-10-14 NOTE — Patient Instructions (Signed)
Cesarean Delivery, Care After This sheet gives you information about how to care for yourself after your procedure. Your health care provider may also give you more specific instructions. If you have problems or questions, contact your health care provider. What can I expect after the procedure? After the procedure, it is common to have:  A small amount of blood or clear fluid coming from the incision.  Some redness, swelling, and pain in your incision area.  Some abdominal pain and soreness.  Vaginal bleeding (lochia). Even though you did not have a vaginal delivery, you will still have vaginal bleeding and discharge.  Pelvic cramps.  Fatigue. You may have pain, swelling, and discomfort in the tissue between your vagina and your anus (perineum) if:  Your C-section was unplanned, and you were allowed to labor and push.  An incision was made in the area (episiotomy) or the tissue tore during attempted vaginal delivery. Follow these instructions at home: Incision care  Follow instructions from your health care provider about how to take care of your incision. Make sure you: ? Wash your hands with soap and water before you change your bandage (dressing). If soap and water are not available, use hand sanitizer. ? If you have a dressing, change it or remove it as told by your health care provider. ? Leave stitches (sutures), skin staples, skin glue, or adhesive strips in place. These skin closures may need to stay in place for 2 weeks or longer. If adhesive strip edges start to loosen and curl up, you may trim the loose edges. Do not remove adhesive strips completely unless your health care provider tells you to do that.  Check your incision area every day for signs of infection. Check for: ? More redness, swelling, or pain. ? More fluid or blood. ? Warmth. ? Pus or a bad smell.  Do not take baths, swim, or use a hot tub until your health care provider says it's okay. Ask your health  care provider if you can take showers.  When you cough or sneeze, hug a pillow. This helps with pain and decreases the chance of your incision opening up (dehiscing). Do this until your incision heals.   Medicines  Take over-the-counter and prescription medicines only as told by your health care provider.  If you were prescribed an antibiotic medicine, take it as told by your health care provider. Do not stop taking the antibiotic even if you start to feel better.  Do not drive or use heavy machinery while taking prescription pain medicine. Lifestyle  Do not drink alcohol. This is especially important if you are breastfeeding or taking pain medicine.  Do not use any products that contain nicotine or tobacco, such as cigarettes, e-cigarettes, and chewing tobacco. If you need help quitting, ask your health care provider. Eating and drinking  Drink at least 8 eight-ounce glasses of water every day unless told not to by your health care provider. If you breastfeed, you may need to drink even more water.  Eat high-fiber foods every day. These foods may help prevent or relieve constipation. High-fiber foods include: ? Whole grain cereals and breads. ? Brown rice. ? Beans. ? Fresh fruits and vegetables. Activity  If possible, have someone help you care for your baby and help with household activities for at least a few days after you leave the hospital.  Return to your normal activities as told by your health care provider. Ask your health care provider what activities are safe for   you.  Rest as much as possible. Try to rest or take a nap while your baby is sleeping.  Do not lift anything that is heavier than 10 lbs (4.5 kg), or the limit that you were told, until your health care provider says that it is safe.  Talk with your health care provider about when you can engage in sexual activity. This may depend on your: ? Risk of infection. ? How fast you heal. ? Comfort and desire to  engage in sexual activity.   General instructions  Do not use tampons or douches until your health care provider approves.  Wear loose, comfortable clothing and a supportive and well-fitting bra.  Keep your perineum clean and dry. Wipe from front to back when you use the toilet.  If you pass a blood clot, save it and call your health care provider to discuss. Do not flush blood clots down the toilet before you get instructions from your health care provider.  Keep all follow-up visits for you and your baby as told by your health care provider. This is important. Contact a health care provider if:  You have: ? A fever. ? Bad-smelling vaginal discharge. ? Pus or a bad smell coming from your incision. ? Difficulty or pain when urinating. ? A sudden increase or decrease in the frequency of your bowel movements. ? More redness, swelling, or pain around your incision. ? More fluid or blood coming from your incision. ? A rash. ? Nausea. ? Little or no interest in activities you used to enjoy. ? Questions about caring for yourself or your baby.  Your incision feels warm to the touch.  Your breasts turn red or become painful or hard.  You feel unusually sad or worried.  You vomit.  You pass a blood clot from your vagina.  You urinate more than usual.  You are dizzy or light-headed. Get help right away if:  You have: ? Pain that does not go away or get better with medicine. ? Chest pain. ? Difficulty breathing. ? Blurred vision or spots in your vision. ? Thoughts about hurting yourself or your baby. ? New pain in your abdomen or in one of your legs. ? A severe headache.  You faint.  You bleed from your vagina so much that you fill more than one sanitary pad in one hour. Bleeding should not be heavier than your heaviest period. Summary  After the procedure, it is common to have pain at your incision site, abdominal cramping, and slight bleeding from your vagina.  Check  your incision area every day for signs of infection.  Tell your health care provider about any unusual symptoms.  Keep all follow-up visits for you and your baby as told by your health care provider. This information is not intended to replace advice given to you by your health care provider. Make sure you discuss any questions you have with your health care provider. Document Revised: 01/01/2018 Document Reviewed: 01/01/2018 Elsevier Patient Education  2021 Elsevier Inc.  

## 2020-10-14 NOTE — Progress Notes (Signed)
Subjective:  Julia Santiago is a 30 y.o. G3P2002 at [redacted]w[redacted]d being seen today for ongoing prenatal care.  She is currently monitored for the following issues for this low-risk pregnancy and has History of 2 cesarean sections; Supervision of other normal pregnancy, antepartum; History of gestational diabetes; History of depression; ASCUS with positive high risk HPV cervical; High risk pregnancy due to smoking in third trimester; and Unwanted fertility on their problem list.  Patient reports general discomforts of pregnancy.  Contractions: Not present. Vag. Bleeding: None.  Movement: Present. Denies leaking of fluid.   The following portions of the patient's history were reviewed and updated as appropriate: allergies, current medications, past family history, past medical history, past social history, past surgical history and problem list. Problem list updated.  Objective:   Vitals:   10/14/20 0915  Weight: 224 lb 1.6 oz (101.7 kg)    Fetal Status: Fetal Heart Rate (bpm): 138   Movement: Present     General:  Alert, oriented and cooperative. Patient is in no acute distress.  Skin: Skin is warm and dry. No rash noted.   Cardiovascular: Normal heart rate noted  Respiratory: Normal respiratory effort, no problems with respiration noted  Abdomen: Soft, gravid, appropriate for gestational age. Pain/Pressure: Present     Pelvic:  Cervical exam deferred        Extremities: Normal range of motion.  Edema: None  Mental Status: Normal mood and affect. Normal behavior. Normal judgment and thought content.   Urinalysis:      Assessment and Plan:  Pregnancy: G3P2002 at [redacted]w[redacted]d  1. Supervision of other normal pregnancy, antepartum  - Strep Gp B Culture+Rflx - Cervicovaginal ancillary only( [place]) - Tdap vaccine greater than or equal to 7yo IM  2. History of 2 cesarean sections Repeat at 39 weeks Request sent  3. Unwanted fertility Papers signed  Preterm labor symptoms and general  obstetric precautions including but not limited to vaginal bleeding, contractions, leaking of fluid and fetal movement were reviewed in detail with the patient. Please refer to After Visit Summary for other counseling recommendations.  Return in about 1 week (around 10/21/2020) for OB visit, face to face, any provider.   Hermina Staggers, MD

## 2020-10-15 NOTE — Progress Notes (Signed)
Appt cancelled

## 2020-10-17 ENCOUNTER — Encounter: Payer: Self-pay | Admitting: *Deleted

## 2020-10-17 ENCOUNTER — Telehealth: Payer: Self-pay | Admitting: Advanced Practice Midwife

## 2020-10-17 LAB — CERVICOVAGINAL ANCILLARY ONLY
Chlamydia: NEGATIVE
Comment: NEGATIVE
Comment: NEGATIVE
Comment: NORMAL
Neisseria Gonorrhea: NEGATIVE
Trichomonas: NEGATIVE

## 2020-10-17 NOTE — Telephone Encounter (Signed)
Call to patient. Voice mail has number confirmation.  Left message advising of surgery date- Wednesday, 11-02-20 at 0930. Arrive 0800. Will receive letter in My Chart and by mail. Phone number [phone].

## 2020-10-18 LAB — STREP GP B CULTURE+RFLX: Strep Gp B Culture+Rflx: NEGATIVE

## 2020-10-20 ENCOUNTER — Other Ambulatory Visit: Payer: Self-pay

## 2020-10-20 ENCOUNTER — Encounter (HOSPITAL_COMMUNITY): Payer: Self-pay

## 2020-10-20 ENCOUNTER — Ambulatory Visit (INDEPENDENT_AMBULATORY_CARE_PROVIDER_SITE_OTHER): Payer: Medicaid Other | Admitting: Advanced Practice Midwife

## 2020-10-20 ENCOUNTER — Encounter: Payer: Self-pay | Admitting: Advanced Practice Midwife

## 2020-10-20 VITALS — BP 114/72 | HR 85 | Wt 223.0 lb

## 2020-10-20 DIAGNOSIS — Z3A37 37 weeks gestation of pregnancy: Secondary | ICD-10-CM

## 2020-10-20 DIAGNOSIS — Z98891 History of uterine scar from previous surgery: Secondary | ICD-10-CM

## 2020-10-20 DIAGNOSIS — Z3009 Encounter for other general counseling and advice on contraception: Secondary | ICD-10-CM

## 2020-10-20 DIAGNOSIS — O99333 Smoking (tobacco) complicating pregnancy, third trimester: Secondary | ICD-10-CM

## 2020-10-20 NOTE — Progress Notes (Signed)
ROB  [redacted]w[redacted]d  CC: None   Pt declines cervix check today

## 2020-10-20 NOTE — Patient Instructions (Signed)
Julia Santiago  10/20/2020   Your procedure is scheduled on:  11/02/2020  Arrive at 0730 at Entrance C on [address] at [facility]  and [address]. You are invited to use the FREE valet parking or use the Visitor's parking deck.  Pick up the phone at the desk and dial [phone].  Call this number if you have problems the morning of surgery: [phone]  Remember:   Do not eat food:(After Midnight) Desps de medianoche.  Do not drink clear liquids: (After Midnight) Desps de medianoche.  Take these medicines the morning of surgery with A SIP OF WATER:  none   Do not wear jewelry, make-up or nail polish.  Do not wear lotions, powders, or perfumes. Do not wear deodorant.  Do not shave 48 hours prior to surgery.  Do not bring valuables to the hospital.  [facility] is not   responsible for any belongings or valuables brought to the hospital.  Contacts, dentures or bridgework may not be worn into surgery.  Leave suitcase in the car. After surgery it may be brought to your room.  For patients admitted to the hospital, checkout time is 11:00 AM the day of              discharge.      Please read over the following fact sheets that you were given:     Preparing for Surgery

## 2020-10-20 NOTE — Progress Notes (Signed)
   PRENATAL VISIT NOTE  Subjective:  Julia Santiago is a 30 y.o. G3P2002 at [redacted]w[redacted]d being seen today for ongoing prenatal care.  She is currently monitored for the following issues for this high-risk pregnancy and has History of 2 cesarean sections; Supervision of other normal pregnancy, antepartum; History of gestational diabetes; History of depression; ASCUS with positive high risk HPV cervical; High risk pregnancy due to smoking in third trimester; and Unwanted fertility on their problem list.  Patient reports no complaints.  Contractions: Not present. Vag. Bleeding: None.  Movement: Present. Denies leaking of fluid.   The following portions of the patient's history were reviewed and updated as appropriate: allergies, current medications, past family history, past medical history, past social history, past surgical history and problem list.   Objective:   Vitals:   10/20/20 1139  BP: 114/72  Pulse: 85  Weight: 223 lb (101.2 kg)    Fetal Status: Fetal Heart Rate (bpm): 138   Movement: Present     General:  Alert, oriented and cooperative. Patient is in no acute distress.  Skin: Skin is warm and dry. No rash noted.   Cardiovascular: Normal heart rate noted  Respiratory: Normal respiratory effort, no problems with respiration noted  Abdomen: Soft, gravid, appropriate for gestational age.  Pain/Pressure: Absent     Pelvic: Cervical exam deferred        Extremities: Normal range of motion.  Edema: None  Mental Status: Normal mood and affect. Normal behavior. Normal judgment and thought content.   Assessment and Plan:  Pregnancy: G3P2002 at [redacted]w[redacted]d 1. High risk pregnancy due to smoking in third trimester --Anticipatory guidance about next visits/weeks of pregnancy given. --Next visit in 1 week  2. History of 2 cesarean sections --Planned repeat scheduled at 39 weeks  3. Unwanted fertility --Desires BTL, papers signed 05/2020  Term labor symptoms and general obstetric precautions  including but not limited to vaginal bleeding, contractions, leaking of fluid and fetal movement were reviewed in detail with the patient. Please refer to After Visit Summary for other counseling recommendations.   No follow-ups on file.  Future Appointments  Date Time Provider Department Center  10/31/2020  9:15 AM [facility]-SCREENING [facility] None  10/31/2020 10:00 AM [facility] PAT 1 [facility] None    Sharen Counter, CNM

## 2020-10-20 NOTE — Patient Instructions (Signed)
Labor Precautions Reasons to come to MAU at [facility]:  1.  Contractions are  5 minutes apart or less, each last 1 minute, these have been going on for 1-2 hours, and you cannot walk or talk during them 2.  You have a large gush of fluid, or a trickle of fluid that will not stop and you have to wear a pad 3.  You have bleeding that is bright red, heavier than spotting--like menstrual bleeding (spotting can be normal in early labor or after a check of your cervix) 4.  You do not feel the baby moving like he/she normally does  

## 2020-10-20 NOTE — Telephone Encounter (Signed)
Preadmission screen  

## 2020-10-21 ENCOUNTER — Encounter (HOSPITAL_COMMUNITY): Payer: Self-pay

## 2020-10-26 ENCOUNTER — Ambulatory Visit (INDEPENDENT_AMBULATORY_CARE_PROVIDER_SITE_OTHER): Payer: Medicaid Other | Admitting: Nurse Practitioner

## 2020-10-26 ENCOUNTER — Other Ambulatory Visit: Payer: Self-pay

## 2020-10-26 VITALS — BP 123/74 | HR 84 | Wt 224.8 lb

## 2020-10-26 DIAGNOSIS — Z98891 History of uterine scar from previous surgery: Secondary | ICD-10-CM

## 2020-10-26 DIAGNOSIS — Z3A38 38 weeks gestation of pregnancy: Secondary | ICD-10-CM

## 2020-10-26 DIAGNOSIS — Z3480 Encounter for supervision of other normal pregnancy, unspecified trimester: Secondary | ICD-10-CM

## 2020-10-26 NOTE — Progress Notes (Signed)
    Subjective:  Julia Santiago is a 30 y.o. G3P2002 at [redacted]w[redacted]d being seen today for ongoing prenatal care.  She is currently monitored for the following issues for this low-risk pregnancy and has History of 2 cesarean sections; Supervision of other normal pregnancy, antepartum; History of gestational diabetes; History of depression; ASCUS with positive high risk HPV cervical; High risk pregnancy due to smoking in third trimester; and Unwanted fertility on their problem list.  Patient reports headache.  Contractions: Not present. Vag. Bleeding: None.  Movement: Present. Denies leaking of fluid.   The following portions of the patient's history were reviewed and updated as appropriate: allergies, current medications, past family history, past medical history, past social history, past surgical history and problem list. Problem list updated.  Objective:   Vitals:   10/26/20 1124  BP: 123/74  Pulse: 84  Weight: 224 lb 12.8 oz (102 kg)    Fetal Status: Fetal Heart Rate (bpm): 132 Fundal Height: 39 cm Movement: Present     General:  Alert, oriented and cooperative. Patient is in no acute distress.  Skin: Skin is warm and dry. No rash noted.   Cardiovascular: Normal heart rate noted  Respiratory: Normal respiratory effort, no problems with respiration noted  Abdomen: Soft, gravid, appropriate for gestational age. Pain/Pressure: Absent     Pelvic:  Cervical exam deferred        Extremities: Normal range of motion.  Edema: None  Mental Status: Normal mood and affect. Normal behavior. Normal judgment and thought content.   Urinalysis:      Assessment and Plan:  Pregnancy: G3P2002 at [redacted]w[redacted]d  1. Supervision of other normal pregnancy, antepartum Headache today - since last night but not continuous, will get Tylenol when she leaves visit today.  Not severe.  Has eaten breakfast with protein.  Headache left after sleeping and then came back.  Encouraged increased PO fluids.  Advised if headache  worsens, to call the office and be seen.  Today has normal BP and no edema. Is aware of preop appointment on Monday and C/S in one week.  2. History of 2 cesarean sections Scheduled 11-02-20    Term labor symptoms and general obstetric precautions including but not limited to vaginal bleeding, contractions, leaking of fluid and fetal movement were reviewed in detail with the patient. Please refer to After Visit Summary for other counseling recommendations.  Return in about 5 weeks (around 11/30/2020) for postpartum visit.  Nolene Bernheim, RN, MSN, NP-BC Nurse Practitioner, [facility] for [employer], [facility] 10/26/2020 11:46 AM

## 2020-10-26 NOTE — Progress Notes (Signed)
Pt reports fetal movement with headache today.

## 2020-10-31 ENCOUNTER — Other Ambulatory Visit: Payer: Self-pay

## 2020-10-31 ENCOUNTER — Other Ambulatory Visit (HOSPITAL_COMMUNITY)
Admission: RE | Admit: 2020-10-31 | Discharge: 2020-10-31 | Disposition: A | Discharge status: Home / Self Care | Payer: Medicaid Other | Source: Ambulatory Visit | Attending: Family Medicine | Admitting: Family Medicine

## 2020-10-31 DIAGNOSIS — Z01812 Encounter for preprocedural laboratory examination: Secondary | ICD-10-CM | POA: Diagnosis present

## 2020-10-31 DIAGNOSIS — Z20822 Contact with and (suspected) exposure to covid-19: Secondary | ICD-10-CM | POA: Insufficient documentation

## 2020-10-31 LAB — CBC
HCT: 30.2 % — ABNORMAL LOW (ref 36.0–46.0)
Hemoglobin: 9.6 g/dL — ABNORMAL LOW (ref 12.0–15.0)
MCH: 27.5 pg (ref 26.0–34.0)
MCHC: 31.8 g/dL (ref 30.0–36.0)
MCV: 86.5 fL (ref 80.0–100.0)
Platelets: 307 10*3/uL (ref 150–400)
RBC: 3.49 MIL/uL — ABNORMAL LOW (ref 3.87–5.11)
RDW: 13 % (ref 11.5–15.5)
WBC: 9.9 10*3/uL (ref 4.0–10.5)
nRBC: 0 % (ref 0.0–0.2)

## 2020-10-31 LAB — TYPE AND SCREEN
ABO/RH(D): AB POS
Antibody Screen: NEGATIVE

## 2020-10-31 LAB — RAPID HIV SCREEN (HIV 1/2 AB+AG)
HIV 1/2 Antibodies: NONREACTIVE
HIV-1 P24 Antigen - HIV24: NONREACTIVE

## 2020-11-01 LAB — RPR: RPR Ser Ql: NONREACTIVE

## 2020-11-01 LAB — SARS CORONAVIRUS 2 (TAT 6-24 HRS): SARS Coronavirus 2: NEGATIVE

## 2020-11-02 ENCOUNTER — Inpatient Hospital Stay (HOSPITAL_COMMUNITY): Payer: Medicaid Other | Admitting: Certified Registered Nurse Anesthetist

## 2020-11-02 ENCOUNTER — Encounter (HOSPITAL_COMMUNITY)
Admission: RE | Disposition: A | Discharge status: Home / Self Care | Payer: Self-pay | Source: Home / Self Care | Attending: Family Medicine

## 2020-11-02 ENCOUNTER — Encounter (HOSPITAL_COMMUNITY): Payer: Self-pay | Admitting: Family Medicine

## 2020-11-02 ENCOUNTER — Inpatient Hospital Stay (HOSPITAL_COMMUNITY)
Admission: RE | Admit: 2020-11-02 | Discharge: 2020-11-04 | DRG: 785 | Disposition: A | Discharge status: Home / Self Care | Payer: Medicaid Other | Attending: Family Medicine | Admitting: Family Medicine

## 2020-11-02 DIAGNOSIS — O34211 Maternal care for low transverse scar from previous cesarean delivery: Principal | ICD-10-CM | POA: Diagnosis present

## 2020-11-02 DIAGNOSIS — Z8659 Personal history of other mental and behavioral disorders: Secondary | ICD-10-CM

## 2020-11-02 DIAGNOSIS — Z87891 Personal history of nicotine dependence: Secondary | ICD-10-CM | POA: Diagnosis not present

## 2020-11-02 DIAGNOSIS — O9902 Anemia complicating childbirth: Secondary | ICD-10-CM | POA: Diagnosis not present

## 2020-11-02 DIAGNOSIS — Z8632 Personal history of gestational diabetes: Secondary | ICD-10-CM | POA: Diagnosis present

## 2020-11-02 DIAGNOSIS — Z302 Encounter for sterilization: Secondary | ICD-10-CM

## 2020-11-02 DIAGNOSIS — D649 Anemia, unspecified: Secondary | ICD-10-CM | POA: Diagnosis not present

## 2020-11-02 DIAGNOSIS — F32A Depression, unspecified: Secondary | ICD-10-CM

## 2020-11-02 DIAGNOSIS — Z98891 History of uterine scar from previous surgery: Secondary | ICD-10-CM

## 2020-11-02 DIAGNOSIS — O99340 Other mental disorders complicating pregnancy, unspecified trimester: Secondary | ICD-10-CM

## 2020-11-02 DIAGNOSIS — Z3A39 39 weeks gestation of pregnancy: Secondary | ICD-10-CM

## 2020-11-02 DIAGNOSIS — Z23 Encounter for immunization: Secondary | ICD-10-CM | POA: Diagnosis not present

## 2020-11-02 DIAGNOSIS — Z9851 Tubal ligation status: Secondary | ICD-10-CM

## 2020-11-02 DIAGNOSIS — R87610 Atypical squamous cells of undetermined significance on cytologic smear of cervix (ASC-US): Secondary | ICD-10-CM | POA: Diagnosis present

## 2020-11-02 DIAGNOSIS — O99344 Other mental disorders complicating childbirth: Secondary | ICD-10-CM | POA: Diagnosis not present

## 2020-11-02 DIAGNOSIS — Z3480 Encounter for supervision of other normal pregnancy, unspecified trimester: Secondary | ICD-10-CM

## 2020-11-02 DIAGNOSIS — R87810 Cervical high risk human papillomavirus (HPV) DNA test positive: Secondary | ICD-10-CM | POA: Diagnosis present

## 2020-11-02 DIAGNOSIS — Z3009 Encounter for other general counseling and advice on contraception: Secondary | ICD-10-CM | POA: Diagnosis present

## 2020-11-02 SURGERY — Surgical Case
Anesthesia: Spinal | Laterality: Bilateral

## 2020-11-02 MED ORDER — SOD CITRATE-CITRIC ACID 500-334 MG/5ML PO SOLN
ORAL | Status: AC
  Filled 2020-11-02: qty 30

## 2020-11-02 MED ORDER — ACETAMINOPHEN 10 MG/ML IV SOLN
INTRAVENOUS | Status: AC
  Filled 2020-11-02: qty 100

## 2020-11-02 MED ORDER — LACTATED RINGERS IV SOLN: INTRAVENOUS | Status: DC

## 2020-11-02 MED ORDER — DIBUCAINE (PERIANAL) 1 % EX OINT: 1.0000 "application " | TOPICAL_OINTMENT | CUTANEOUS | Status: DC | PRN

## 2020-11-02 MED ORDER — PRENATAL MULTIVITAMIN CH
1.0000 | ORAL_TABLET | Freq: Every day | ORAL | Status: DC
  Administered 2020-11-03: 1 via ORAL
  Filled 2020-11-02: qty 1

## 2020-11-02 MED ORDER — OXYTOCIN-SODIUM CHLORIDE 30-0.9 UT/500ML-% IV SOLN
INTRAVENOUS | Status: DC | PRN
  Administered 2020-11-02: 300 [IU] via INTRAVENOUS

## 2020-11-02 MED ORDER — DIPHENHYDRAMINE HCL 25 MG PO CAPS: 25.0000 mg | ORAL_CAPSULE | ORAL | Status: DC | PRN

## 2020-11-02 MED ORDER — DIPHENHYDRAMINE HCL 25 MG PO CAPS: 25.0000 mg | ORAL_CAPSULE | Freq: Four times a day (QID) | ORAL | Status: DC | PRN

## 2020-11-02 MED ORDER — BUPIVACAINE IN DEXTROSE 0.75-8.25 % IT SOLN
INTRATHECAL | Status: DC | PRN
  Administered 2020-11-02: 1.6 mL via INTRATHECAL

## 2020-11-02 MED ORDER — SOD CITRATE-CITRIC ACID 500-334 MG/5ML PO SOLN
30.0000 mL | ORAL | Status: AC
  Administered 2020-11-02: 30 mL via ORAL

## 2020-11-02 MED ORDER — SCOPOLAMINE 1 MG/3DAYS TD PT72
MEDICATED_PATCH | TRANSDERMAL | Status: AC
  Filled 2020-11-02: qty 1

## 2020-11-02 MED ORDER — ENOXAPARIN SODIUM 40 MG/0.4ML ~~LOC~~ SOLN
40.0000 mg | SUBCUTANEOUS | Status: DC
  Administered 2020-11-03 – 2020-11-04 (×2): 40 mg via SUBCUTANEOUS
  Filled 2020-11-02 (×2): qty 0.4

## 2020-11-02 MED ORDER — CLINDAMYCIN PHOSPHATE 900 MG/50ML IV SOLN
INTRAVENOUS | Status: AC
  Filled 2020-11-02: qty 50

## 2020-11-02 MED ORDER — WITCH HAZEL-GLYCERIN EX PADS: 1.0000 "application " | MEDICATED_PAD | CUTANEOUS | Status: DC | PRN

## 2020-11-02 MED ORDER — GENTAMICIN SULFATE 40 MG/ML IJ SOLN
5.0000 mg/kg | INTRAVENOUS | Status: AC
  Administered 2020-11-02: 400 mg via INTRAVENOUS
  Filled 2020-11-02: qty 10

## 2020-11-02 MED ORDER — CLINDAMYCIN PHOSPHATE 900 MG/50ML IV SOLN
900.0000 mg | INTRAVENOUS | Status: AC
  Administered 2020-11-02: 900 mg via INTRAVENOUS

## 2020-11-02 MED ORDER — SIMETHICONE 80 MG PO CHEW
80.0000 mg | CHEWABLE_TABLET | ORAL | Status: DC | PRN
  Administered 2020-11-03: 80 mg via ORAL
  Filled 2020-11-02: qty 1

## 2020-11-02 MED ORDER — LACTATED RINGERS IV SOLN: INTRAVENOUS | Status: DC | PRN

## 2020-11-02 MED ORDER — SIMETHICONE 80 MG PO CHEW
80.0000 mg | CHEWABLE_TABLET | Freq: Three times a day (TID) | ORAL | Status: DC
  Administered 2020-11-02 – 2020-11-04 (×5): 80 mg via ORAL
  Filled 2020-11-02 (×5): qty 1

## 2020-11-02 MED ORDER — MENTHOL 3 MG MT LOZG: 1.0000 | LOZENGE | OROMUCOSAL | Status: DC | PRN

## 2020-11-02 MED ORDER — KETOROLAC TROMETHAMINE 30 MG/ML IJ SOLN
INTRAMUSCULAR | Status: AC
  Filled 2020-11-02: qty 1

## 2020-11-02 MED ORDER — NALOXONE HCL 4 MG/10ML IJ SOLN
1.0000 ug/kg/h | INTRAVENOUS | Status: DC | PRN
  Filled 2020-11-02: qty 5

## 2020-11-02 MED ORDER — MEASLES, MUMPS & RUBELLA VAC IJ SOLR
0.5000 mL | Freq: Once | INTRAMUSCULAR | Status: AC
  Administered 2020-11-04: 0.5 mL via SUBCUTANEOUS
  Filled 2020-11-02: qty 0.5

## 2020-11-02 MED ORDER — SODIUM CHLORIDE 0.9% FLUSH: 3.0000 mL | INTRAVENOUS | Status: DC | PRN

## 2020-11-02 MED ORDER — SENNOSIDES-DOCUSATE SODIUM 8.6-50 MG PO TABS
2.0000 | ORAL_TABLET | ORAL | Status: DC
  Administered 2020-11-02 – 2020-11-03 (×2): 2 via ORAL
  Filled 2020-11-02 (×2): qty 2

## 2020-11-02 MED ORDER — SCOPOLAMINE 1 MG/3DAYS TD PT72
1.0000 | MEDICATED_PATCH | Freq: Once | TRANSDERMAL | Status: DC
  Administered 2020-11-02: 1.5 mg via TRANSDERMAL

## 2020-11-02 MED ORDER — ONDANSETRON HCL 4 MG/2ML IJ SOLN
INTRAMUSCULAR | Status: DC | PRN
  Administered 2020-11-02: 4 mg via INTRAVENOUS

## 2020-11-02 MED ORDER — NALBUPHINE HCL 10 MG/ML IJ SOLN: 5.0000 mg | INTRAMUSCULAR | Status: DC | PRN

## 2020-11-02 MED ORDER — COCONUT OIL OIL
1.0000 "application " | TOPICAL_OIL | Status: DC | PRN
  Administered 2020-11-03: 1 via TOPICAL

## 2020-11-02 MED ORDER — KETOROLAC TROMETHAMINE 30 MG/ML IJ SOLN: 30.0000 mg | Freq: Once | INTRAMUSCULAR | Status: DC

## 2020-11-02 MED ORDER — TETANUS-DIPHTH-ACELL PERTUSSIS 5-2.5-18.5 LF-MCG/0.5 IM SUSY: 0.5000 mL | PREFILLED_SYRINGE | Freq: Once | INTRAMUSCULAR | Status: DC

## 2020-11-02 MED ORDER — OXYTOCIN-SODIUM CHLORIDE 30-0.9 UT/500ML-% IV SOLN: 2.5000 [IU]/h | INTRAVENOUS | Status: AC

## 2020-11-02 MED ORDER — PHENYLEPHRINE HCL-NACL 20-0.9 MG/250ML-% IV SOLN
INTRAVENOUS | Status: DC | PRN
  Administered 2020-11-02: 60 ug/min via INTRAVENOUS

## 2020-11-02 MED ORDER — DEXAMETHASONE SODIUM PHOSPHATE 4 MG/ML IJ SOLN
INTRAMUSCULAR | Status: AC
  Filled 2020-11-02: qty 1

## 2020-11-02 MED ORDER — PHENYLEPHRINE HCL (PRESSORS) 10 MG/ML IV SOLN
INTRAVENOUS | Status: DC | PRN
  Administered 2020-11-02: 40 ug via INTRAVENOUS
  Administered 2020-11-02: 80 ug via INTRAVENOUS

## 2020-11-02 MED ORDER — PHENYLEPHRINE 40 MCG/ML (10ML) SYRINGE FOR IV PUSH (FOR BLOOD PRESSURE SUPPORT)
PREFILLED_SYRINGE | INTRAVENOUS | Status: AC
  Filled 2020-11-02: qty 10

## 2020-11-02 MED ORDER — OXYCODONE HCL 5 MG PO TABS: 5.0000 mg | ORAL_TABLET | ORAL | Status: DC | PRN

## 2020-11-02 MED ORDER — OXYCODONE HCL 5 MG PO TABS: 5.0000 mg | ORAL_TABLET | Freq: Once | ORAL | Status: DC | PRN

## 2020-11-02 MED ORDER — KETOROLAC TROMETHAMINE 30 MG/ML IJ SOLN
30.0000 mg | Freq: Once | INTRAMUSCULAR | Status: AC | PRN
  Administered 2020-11-02: 30 mg via INTRAVENOUS

## 2020-11-02 MED ORDER — KETOROLAC TROMETHAMINE 30 MG/ML IJ SOLN
30.0000 mg | Freq: Four times a day (QID) | INTRAMUSCULAR | Status: AC
  Administered 2020-11-02 – 2020-11-03 (×3): 30 mg via INTRAVENOUS
  Filled 2020-11-02 (×3): qty 1

## 2020-11-02 MED ORDER — FENTANYL CITRATE (PF) 100 MCG/2ML IJ SOLN
INTRAMUSCULAR | Status: AC
  Filled 2020-11-02: qty 2

## 2020-11-02 MED ORDER — HYDROMORPHONE HCL 1 MG/ML IJ SOLN: 0.2500 mg | INTRAMUSCULAR | Status: DC | PRN

## 2020-11-02 MED ORDER — NALBUPHINE HCL 10 MG/ML IJ SOLN: 5.0000 mg | Freq: Once | INTRAMUSCULAR | Status: DC | PRN

## 2020-11-02 MED ORDER — MORPHINE SULFATE (PF) 0.5 MG/ML IJ SOLN
INTRAMUSCULAR | Status: DC | PRN
  Administered 2020-11-02: 150 ug via INTRATHECAL

## 2020-11-02 MED ORDER — PROMETHAZINE HCL 25 MG/ML IJ SOLN: 6.2500 mg | INTRAMUSCULAR | Status: DC | PRN

## 2020-11-02 MED ORDER — FENTANYL CITRATE (PF) 100 MCG/2ML IJ SOLN
INTRAMUSCULAR | Status: DC | PRN
  Administered 2020-11-02: 15 ug via INTRATHECAL

## 2020-11-02 MED ORDER — DEXAMETHASONE SODIUM PHOSPHATE 10 MG/ML IJ SOLN
INTRAMUSCULAR | Status: DC | PRN
  Administered 2020-11-02: 10 mg via INTRAVENOUS

## 2020-11-02 MED ORDER — OXYTOCIN-SODIUM CHLORIDE 30-0.9 UT/500ML-% IV SOLN
INTRAVENOUS | Status: AC
  Filled 2020-11-02: qty 500

## 2020-11-02 MED ORDER — ACETAMINOPHEN 10 MG/ML IV SOLN
INTRAVENOUS | Status: DC | PRN
  Administered 2020-11-02: 1000 mg via INTRAVENOUS

## 2020-11-02 MED ORDER — ACETAMINOPHEN 325 MG PO TABS
650.0000 mg | ORAL_TABLET | ORAL | Status: DC | PRN
  Administered 2020-11-03: 650 mg via ORAL
  Filled 2020-11-02: qty 2

## 2020-11-02 MED ORDER — ONDANSETRON HCL 4 MG/2ML IJ SOLN
INTRAMUSCULAR | Status: AC
  Filled 2020-11-02: qty 2

## 2020-11-02 MED ORDER — DIPHENHYDRAMINE HCL 50 MG/ML IJ SOLN: 12.5000 mg | INTRAMUSCULAR | Status: DC | PRN

## 2020-11-02 MED ORDER — ONDANSETRON HCL 4 MG/2ML IJ SOLN: 4.0000 mg | Freq: Three times a day (TID) | INTRAMUSCULAR | Status: DC | PRN

## 2020-11-02 MED ORDER — PHENYLEPHRINE HCL-NACL 20-0.9 MG/250ML-% IV SOLN
INTRAVENOUS | Status: AC
  Filled 2020-11-02: qty 250

## 2020-11-02 MED ORDER — MORPHINE SULFATE (PF) 0.5 MG/ML IJ SOLN
INTRAMUSCULAR | Status: AC
  Filled 2020-11-02: qty 10

## 2020-11-02 MED ORDER — MEPERIDINE HCL 25 MG/ML IJ SOLN: 6.2500 mg | INTRAMUSCULAR | Status: DC | PRN

## 2020-11-02 MED ORDER — IBUPROFEN 600 MG PO TABS
600.0000 mg | ORAL_TABLET | Freq: Four times a day (QID) | ORAL | Status: DC
  Administered 2020-11-03 – 2020-11-04 (×5): 600 mg via ORAL
  Filled 2020-11-02 (×5): qty 1

## 2020-11-02 MED ORDER — SERTRALINE HCL 50 MG PO TABS
50.0000 mg | ORAL_TABLET | Freq: Every day | ORAL | Status: DC
  Administered 2020-11-03 – 2020-11-04 (×2): 50 mg via ORAL
  Filled 2020-11-02 (×2): qty 1

## 2020-11-02 MED ORDER — NALOXONE HCL 0.4 MG/ML IJ SOLN
0.4000 mg | INTRAMUSCULAR | Status: DC | PRN
Start: 2020-11-02 — End: 2020-11-04

## 2020-11-02 MED ORDER — OXYCODONE HCL 5 MG/5ML PO SOLN: 5.0000 mg | Freq: Once | ORAL | Status: DC | PRN

## 2020-11-02 SURGICAL SUPPLY — 31 items
BENZOIN TINCTURE PRP APPL 2/3 (GAUZE/BANDAGES/DRESSINGS) ×2 IMPLANT
CANISTER SUCT 3000ML PPV (MISCELLANEOUS) ×2 IMPLANT
CHLORAPREP W/TINT 26ML (MISCELLANEOUS) ×2 IMPLANT
CLAMP CORD UMBIL (MISCELLANEOUS) ×2 IMPLANT
CLIP FILSHIE TUBAL LIGA STRL (Clip) ×1 IMPLANT
CLOSURE STERI STRIP 1/2 X4 (GAUZE/BANDAGES/DRESSINGS) ×2 IMPLANT
CLOTH BEACON ORANGE TIMEOUT ST (SAFETY) ×2 IMPLANT
DRAPE SHEET LG 3/4 BI-LAMINATE (DRAPES) ×2 IMPLANT
DRSG OPSITE POSTOP 4X10 (GAUZE/BANDAGES/DRESSINGS) ×2 IMPLANT
ELECT REM PT RETURN 9FT ADLT (ELECTROSURGICAL) ×2
ELECTRODE REM PT RTRN 9FT ADLT (ELECTROSURGICAL) ×1 IMPLANT
GLOVE BIOGEL PI IND STRL 7.0 (GLOVE) ×5 IMPLANT
GLOVE BIOGEL PI INDICATOR 7.0 (GLOVE) ×5
GLOVE ECLIPSE 6.5 STRL STRAW (GLOVE) ×4 IMPLANT
GOWN STRL REUS W/ TWL LRG LVL3 (GOWN DISPOSABLE) ×2 IMPLANT
GOWN STRL REUS W/TWL LRG LVL3 (GOWN DISPOSABLE) ×4
NS IRRIG 1000ML POUR BTL (IV SOLUTION) ×2 IMPLANT
PAD OB MATERNITY 4.3X12.25 (PERSONAL CARE ITEMS) ×2 IMPLANT
PAD PREP 24X48 CUFFED NSTRL (MISCELLANEOUS) ×2 IMPLANT
RETRACTOR WND ALEXIS 25 LRG (MISCELLANEOUS) ×1 IMPLANT
RTRCTR WOUND ALEXIS 25CM LRG (MISCELLANEOUS) ×2
STRIP CLOSURE SKIN 1/2X4 (GAUZE/BANDAGES/DRESSINGS) ×2 IMPLANT
SUT PLAIN 0 NONE (SUTURE) ×4 IMPLANT
SUT PLAIN 2 0 XLH (SUTURE) ×2 IMPLANT
SUT VIC AB 0 CT1 36 (SUTURE) ×4 IMPLANT
SUT VIC AB 2-0 CT1 27 (SUTURE) ×1
SUT VIC AB 2-0 CT1 TAPERPNT 27 (SUTURE) ×1 IMPLANT
SUT VIC AB 4-0 KS 27 (SUTURE) ×2 IMPLANT
TOWEL OR 17X24 6PK STRL BLUE (TOWEL DISPOSABLE) ×6 IMPLANT
TRAY FOLEY CATH SILVER 16FR (SET/KITS/TRAYS/PACK) ×2 IMPLANT
WATER STERILE IRR 1000ML POUR (IV SOLUTION) ×2 IMPLANT

## 2020-11-02 NOTE — Lactation Note (Signed)
This note was copied from a baby's chart. Lactation Consultation Note  Patient Name: Girl Kilynn Augenstein Today's Date: 11/02/2020   Age:29 hours  Mother states she would only like to formula feed but for now is pumping to try give her baby colostrum. Mother does not plan to pump while here otherwise.   Dahlia Byes Boschen 11/02/2020, 1:24 PM

## 2020-11-02 NOTE — Transfer of Care (Signed)
Immediate Anesthesia Transfer of Care Note  Patient: Julia Santiago  Procedure(s) Performed: CESAREAN SECTION WITH BILATERAL TUBAL LIGATION (Bilateral )  Patient Location: PACU  Anesthesia Type:Spinal  Level of Consciousness: awake, alert  and oriented  Airway & Oxygen Therapy: Patient Spontanous Breathing  Post-op Assessment: Report given to RN and Post -op Vital signs reviewed and stable  Post vital signs: Reviewed and stable  Last Vitals:  Vitals Value Taken Time  BP 117/72 11/02/20 1030  Temp    Pulse 62 11/02/20 1032  Resp 19 11/02/20 1032  SpO2 100 % 11/02/20 1032  Vitals shown include unvalidated device data.  Last Pain:  Vitals:   11/02/20 0745  TempSrc: Oral         Complications: No complications documented.

## 2020-11-02 NOTE — H&P (Signed)
OBSTETRIC ADMISSION HISTORY AND PHYSICAL  Julia Santiago is a 30 y.o. female G93P2002 with IUP at [redacted]w[redacted]d by 12 wk u/s presenting for elective rLTCS. She reports +FMs, No LOF, no VB, no blurry vision, headaches or peripheral edema, and RUQ pain.  She plans on breast feeding. She request BTL for birth control, signed 05/24/20. 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 Anxiety   . Depression    was on certaline for pp depression  . Gestational diabetes   . Headache     Past Surgical History: Past Surgical History:  Procedure Laterality Date  . CESAREAN SECTION N/A 01/29/2015   Procedure: CESAREAN SECTION;  Surgeon: Shelly Bombard, MD;  Location: [facility];  Service: Obstetrics;  Laterality: N/A;  . CESAREAN SECTION N/A 12/21/2016   Procedure: REPEAT CESAREAN SECTION;  Surgeon: Mora Bellman, MD;  Location: [place] [facility];  Service: Obstetrics;  Laterality: N/A;    Obstetrical History: OB History    Gravida  3   Para  2   Term  2   Preterm      AB      Living  2     SAB      IAB      Ectopic      Multiple  0   Live Births  2           Social History Social History   Socioeconomic History  . Marital status: Single    Spouse name: Not on file  . Number of children: 2  . Years of education: Not on file  . Highest education level: Not on file  Occupational History  . Occupation: UNEMPLOYED  Tobacco Use  . Smoking status: Former Smoker    Packs/day: 0.25    Types: Cigarettes    Quit date: 07/2020    Years since quitting:  0.3  . Smokeless tobacco: Never Used  Vaping Use  . Vaping Use: Never used  Substance and Sexual Activity  . Alcohol use: Yes    Alcohol/week: 0.0 standard drinks    Comment: social use  . Drug use: No  . Sexual activity: Yes    Birth control/protection: None  Other Topics Concern  . Not on file  Social History Narrative  . Not on file   Social Determinants of Health   Financial Resource Strain: Not on file  Food Insecurity: Not on file  Transportation Needs: Not on file  Physical Activity: Not on file  Stress: Not on file  Social Connections: Not on file    Family History: Family History  Problem Relation Age of Onset  . Anxiety disorder Mother   . Depression Mother   . Heart disease Father   . Stroke Maternal Grandmother   . Arthritis Maternal Grandmother   . Heart disease Maternal Grandfather   . Alcohol abuse Maternal Grandfather   . Cancer Paternal Grandmother     Allergies: Allergies  Allergen Reactions  . Penicillins     Has patient had a PCN reaction causing immediate rash, facial/tongue/throat swelling, SOB or lightheadedness with  hypotension: No Has patient had a PCN reaction causing severe rash involving mucus membranes or skin necrosis: No Has patient had a PCN reaction that required hospitalization: No Has patient had a PCN reaction occurring within the last 10 years: No If all of the above answers are "NO", then may proceed with Cephalosporin use.  As a child-reaction unknown    Medications Prior to Admission  Medication Sig Dispense Refill Last Dose  . Prenatal Vit-Fe Fumarate-FA (PRENATAL VITAMINS) 28-0.8 MG TABS Take 1 tablet by mouth daily. 60 tablet 3   . aspirin EC 81 MG tablet Take 1 tablet (81 mg total) by mouth daily. Swallow whole. (Patient not taking: Reported on 10/27/2020) 30 tablet 11 Not Taking at Unknown time  . Blood Pressure Monitoring (BLOOD PRESSURE KIT) DEVI 1 kit by Does not apply route once a week. Check Blood Pressure  regularly and record readings into the Babyscripts App.  Large Cuff.  DX O90.0 (Patient not taking: No sig reported) 1 each 0   . Elastic Bandages & Supports (COMFORT FIT MATERNITY SUPP SM) MISC Wear as directed. (Patient not taking: No sig reported) 1 each 0   . ferrous sulfate 325 (65 FE) MG tablet Take 1 tablet (325 mg total) by mouth every other day. (Patient not taking: Reported on 10/27/2020) 60 tablet 5 Not Taking at Unknown time  . sertraline (ZOLOFT) 50 MG tablet Take 1 tablet (50 mg total) by mouth daily. (Patient not taking: Reported on 10/27/2020) 30 tablet 5 Not Taking at Unknown time     Review of Systems   All systems reviewed and negative except as stated in HPI  Temperature 98.1 F (36.7 C), temperature source Oral, height $RemoveBefo'5\' 8"'LGeSSGPcxCH$  (1.727 m), weight 101.6 kg, last menstrual period 02/03/2020, unknown if currently breastfeeding. General appearance: alert, cooperative and no distress Lungs: normal respiratory effort Heart: regular rate and rhythm Abdomen: soft, non-tender; gravid Pelvic: as noted below Extremities: Homans sign is negative, no sign of DVT  Prenatal labs: ABO, Rh: --/--/AB POS (04/25 1038) Antibody: NEG (04/25 1038) Rubella: 0.90 (10/19 1051) RPR: NON REACTIVE (04/25 1030)  HBsAg: Negative (10/19 1051)  HIV: NON REACTIVE (04/25 1030)  GBS: Negative/-- (04/08 0947)  2 hr Glucola passed Genetic screening low risk female Anatomy US normal  Prenatal Transfer Tool  Maternal Diabetes: No Genetic Screening: Normal Maternal Ultrasounds/Referrals: Normal Fetal Ultrasounds or other Referrals:  None Maternal Substance Abuse:  No Significant Maternal Medications:  Meds include: Zoloft Significant Maternal Lab Results: Group B Strep negative  No results found for this or any previous visit (from the past 24 hour(s)).  Patient Active Problem List   Diagnosis Date Noted  . S/P repeat low transverse C-section 11/02/2020  . Unwanted fertility 10/14/2020  . High  risk pregnancy due to smoking in third trimester 09/21/2020  . ASCUS with positive high risk HPV cervical 04/30/2020  . History of gestational diabetes 04/26/2020  . History of depression 04/26/2020  . Supervision of other normal pregnancy, antepartum 04/19/2020  . History of 2 cesarean sections 12/21/2016    Assessment/Plan:  Julia Santiago is a 30 y.o. G3P2002 at [redacted]w[redacted]d here for scheduled rLTCS (history of cesarean section x2).  #rLTCS/BTL  The risks of cesarean section were discussed with the patient including but were not limited to: bleeding which may require transfusion or reoperation; infection which may require antibiotics; injury to bowel, bladder, ureters or other surrounding organs; injury to the fetus; need for additional procedures including hysterectomy in the event of  a life-threatening hemorrhage; placental abnormalities wth subsequent pregnancies, incisional problems, thromboembolic phenomenon and other postoperative/anesthesia complications.  Patient also desires permanent sterilization.  Other reversible forms of contraception were discussed with patient; she declines all other modalities. Risks of procedure discussed with patient including but not limited to: risk of regret, permanence of method, bleeding, infection, injury to surrounding organs and need for additional procedures.  Failure risk of about 1% with increased risk of ectopic gestation if pregnancy occurs was also discussed with patient.  Also discussed possibility of post-tubal pain syndrome. The patient concurred with the proposed plan, giving informed written consent for the procedures.  Patient has been NPO since 0000 she will remain NPO for procedure. Anesthesia and OR aware.  Preoperative prophylactic antibiotics and SCDs ordered on call to the OR.  To OR when ready.  #Pain: spinal #FWB:  cat 1 #ID: GBS neg, cefazolin intra-op prophylaxis #MOF: breast #MOC: BTL #Circ: n/a #Anemia of pregnancy: hgb 10/31/20  was 9.6, postpartum IV vs PO iron pending EBL. #Rubella equivocal: MMR postpartum #Depression: mood stable. Elects to start zoloft 50 mg postpartum.  Julia Senate, MD  11/02/2020, 7:57 AM

## 2020-11-02 NOTE — Discharge Summary (Signed)
Postpartum Discharge Summary    Patient Name: Julia Santiago DOB: [birth year] MRN: [id number]  Date of admission: 11/02/2020 Delivery date:11/02/2020  Delivering provider: Caren Macadam  Date of discharge: 11/04/2020  Admitting diagnosis: S/P repeat low transverse C-section [Z98.891] Intrauterine pregnancy: [redacted]w[redacted]d     Secondary diagnosis:  Active Problems:   History of 3 cesarean sections   Supervision of other normal pregnancy, antepartum   History of gestational diabetes   History of depression   ASCUS with positive high risk HPV cervical   Unwanted fertility   S/P repeat low transverse C-section   History of bilateral tubal ligation  Additional problems: none    Discharge diagnosis: Term Pregnancy Delivered and Anemia                                              Post partum procedures:postpartum tubal ligation Augmentation: N/A Complications: None  Hospital course: Sceduled C/S   30 y.o. yo G3P3003 at [redacted]w[redacted]d was admitted to the hospital 11/02/2020 for scheduled cesarean section with the following indication:Elective Repeat.Delivery details are as follows:  Membrane Rupture Time/Date: 9:44 AM ,11/02/2020   Delivery Method:C-Section, Low Transverse  Details of operation can be found in separate operative note.  Patient had an uncomplicated postpartum course.  She is ambulating, tolerating a regular diet, passing flatus, and urinating well. Patient is discharged home in stable condition on  11/04/20        Newborn Data: Birth date:11/02/2020  Birth time:9:45 AM  Gender:Female  Living status:Living  Apgars:8 ,8  Weight:3320 g     Magnesium Sulfate received: No BMZ received: No Rhophylac:N/A KZS:WFUXNAT prior to discharge T-DaP:Given prenatally Flu: No Transfusion:No  Physical exam  Vitals:   11/03/20 0430 11/03/20 1439 11/03/20 2100 11/04/20 0517  BP: 116/78 131/75 (!) 116/91 118/72  Pulse: 60 69 66 66  Resp: $Remo'16 18 17 16  'INtQU$ Temp: 98.3 F (36.8 C) 98 F (36.7  C) 98.4 F (36.9 C) 98.6 F (37 C)  TempSrc: Oral Oral Oral Oral  SpO2: 100% 100% 100% 99%  Weight:      Height:       General: alert, cooperative and no distress Lochia: appropriate Uterine Fundus: firm Incision: Healing well with no significant drainage, No significant erythema, Dressing is clean, dry, and intact DVT Evaluation: No evidence of DVT seen on physical exam. No cords or calf tenderness. No significant calf/ankle edema. Labs: Lab Results  Component Value Date   WBC 15.5 (H) 11/03/2020   HGB 8.9 (L) 11/03/2020   HCT 28.1 (L) 11/03/2020   MCV 87.3 11/03/2020   PLT 288 11/03/2020   No flowsheet data found. Edinburgh Score: Edinburgh Postnatal Depression Scale Screening Tool 11/03/2020  I have been able to laugh and see the funny side of things. 0  I have looked forward with enjoyment to things. 1  I have blamed myself unnecessarily when things went wrong. 3  I have been anxious or worried for no good reason. 2  I have felt scared or panicky for no good reason. 2  Things have been getting on top of me. 1  I have been so unhappy that I have had difficulty sleeping. 1  I have felt sad or miserable. 1  I have been so unhappy that I have been crying. 0  The thought of harming myself has occurred to me. 0  Edinburgh Postnatal Depression Scale Total 11     After visit meds:  Allergies as of 11/04/2020      Reactions   Penicillins    Has patient had a PCN reaction causing immediate rash, facial/tongue/throat swelling, SOB or lightheadedness with hypotension: No Has patient had a PCN reaction causing severe rash involving mucus membranes or skin necrosis: No Has patient had a PCN reaction that required hospitalization: No Has patient had a PCN reaction occurring within the last 10 years: No If all of the above answers are "NO", then may proceed with Cephalosporin use. As a child-reaction unknown      Medication List    STOP taking these medications   aspirin EC  81 MG tablet   Blood Pressure Kit [place] Supp Sm Misc     TAKE these medications   acetaminophen 325 MG tablet Commonly known as: TYLENOL Take 2 tablets (650 mg total) by mouth every 6 (six) hours.   coconut oil Oil Apply 1 application topically as needed (nipple pain).   ferrous sulfate 325 (65 FE) MG tablet Take 1 tablet (325 mg total) by mouth every other day.   ibuprofen 600 MG tablet Commonly known as: ADVIL Take 1 tablet (600 mg total) by mouth every 6 (six) hours.   oxyCODONE 5 MG immediate release tablet Commonly known as: Oxy IR/ROXICODONE Take 1 tablet (5 mg total) by mouth every 6 (six) hours as needed for severe pain or breakthrough pain.   Prenatal Vitamins 28-0.8 MG Tabs Take 1 tablet by mouth daily.   sertraline 50 MG tablet Commonly known as: ZOLOFT Take 1 tablet (50 mg total) by mouth daily.        Discharge home in stable condition Infant Feeding: bottle Infant Disposition:home with mother Discharge instruction: per After Visit Summary and Postpartum booklet. Activity: Advance as tolerated. Pelvic rest for 6 weeks.  Diet: routine diet Future Appointments: Future Appointments  Date Time Provider [place]  11/10/2020 11:00 AM [place] None  11/15/2020  2:00 PM Lynnea Ferrier, LCSW [facility] None  12/12/2020  1:00 PM Sloan Leiter, MD [place] None   Follow up Visit: Message sent to [facility] 11/02/20 by Sylvester Harder.  Please schedule this patient for a In person postpartum visit in 6 weeks with the following provider: Any provider. Additional Postpartum F/U:Postpartum Depression checkup and Incision check 1 week  Low risk pregnancy complicated by: n/a Delivery mode:  C-Section, Low Transverse  Anticipated Birth Control:  BTL done PP   Rozlynn Lippold, Gildardo Cranker, MD OB Fellow, [facility] 11/04/2020 10:40 AM

## 2020-11-02 NOTE — Op Note (Signed)
Julia Santiago PROCEDURE DATE: 11/02/2020  PREOPERATIVE DIAGNOSES: Intrauterine pregnancy at [redacted]w[redacted]d weeks gestation; elective repeat cesarean with history of 2 prior cesarean deliveries, desire for permanent sterilization  POSTOPERATIVE DIAGNOSES: The same  PROCEDURE: Repeat Low Transverse Cesarean Section  SURGEON:  Karyl Kinnier, MD  ASSISTANT: Mart Piggs, MD  ANESTHESIOLOGY TEAM: Anesthesiologist: Lowella Curb, MD CRNA: Rhymer, Doree Fudge, CRNA  INDICATIONS: Julia Santiago is a 30 y.o. [phone] at [redacted]w[redacted]d here for cesarean section and bilateral tubal sterilization secondary to the indications listed under preoperative diagnoses; please see preoperative note for further details.  The risks of cesarean section were discussed with the patient including but were not limited to: bleeding which may require transfusion or reoperation; infection which may require antibiotics; injury to bowel, bladder, ureters or other surrounding organs; injury to the fetus; need for additional procedures including hysterectomy in the event of a life-threatening hemorrhage; placental abnormalities wth subsequent pregnancies, incisional problems, thromboembolic phenomenon and other postoperative/anesthesia complications.   The patient concurred with the proposed plan, giving informed written consent for the procedure.    FINDINGS:  Viable female infant in cephalic presentation.  Apgars APGAR (1 MIN): 7   APGAR (5 MINS): 8   APGAR (10 MINS):  Clear amniotic fluid.  Intact placenta, three vessel cord.  Normal uterus, fallopian tubes and ovaries bilaterally.  ANESTHESIA: Spinal  INTRAVENOUS FLUIDS: 2600 ml   ESTIMATED BLOOD LOSS: 270 ml URINE OUTPUT:  250 ml SPECIMENS: Placenta sent to L&D COMPLICATIONS: None immediate  PROCEDURE IN DETAIL:  The patient preoperatively received intravenous antibiotics and had sequential compression devices applied to her lower extremities.  She was then taken to the  operating room where spinal anesthesia was administered and was found to be adequate. She was then placed in a dorsal supine position with a leftward tilt, and prepped and draped in a sterile manner.  A foley catheter was placed into her bladder and attached to constant gravity.  After an adequate timeout was performed, a Pfannenstiel skin incision was made with scalpel on her preexisting scar and carried through to the underlying layer of fascia. The fascia was incised in the midline, and this incision was extended bilaterally using the Mayo scissors.  Kocher clamps were applied to the superior aspect of the fascial incision and the underlying rectus muscles were dissected off bluntly and sharply. The rectus muscles were separated in the midline and the peritoneum was entered sharply. The Alexis self-retaining retractor was introduced into the abdominal cavity.  Attention was turned to the lower uterine segment where a low transverse hysterotomy was made with a scalpel and extended bilaterally bluntly.  The infant was successfully delivered, the cord was clamped and cut after one minute, and the infant was handed over to the awaiting neonatology team. Uterine massage was then administered, and the placenta delivered intact with a three-vessel cord. The uterus was then cleared of clots and debris.  The hysterotomy was closed with 0 Vicryl in a running locked fashion, and an imbricating layer was also placed with 0 Vicryl.    Attention was then turned to the right fallopian tube. Kelly forceps were placed on the mesosalpinx underneath most of the tube.  This pedicle was double suture ligated with plain gut, and the tube including the fimbriated end was excised.  The left fallopian tube was then identified, doubly ligated, and was excised in a similar fashion allowing for bilateral tubal sterilization via bilateral salpingectomy.  Good hemostasis was noted overall.  The pelvis  was cleared of all clot and debris.  Hemostasis was confirmed on all surfaces.  The retractor was removed.  The peritoneum was closed with a 0 Vicryl running stitch and the rectus muscles were reapproximated using 0 Vicryl interrupted stitches. The fascia was then closed using 0 Vicryl in a running fashion.  The subcutaneous layer was irrigated, and the skin was closed with a 4-0 Vicryl subcuticular stitch. The patient tolerated the procedure well. Sponge, instrument and needle counts were correct x 3.  She was taken to the recovery room in stable condition.   Alric Seton, MD [facility]

## 2020-11-02 NOTE — Anesthesia Postprocedure Evaluation (Signed)
Anesthesia Post Note  Patient: Julia Santiago  Procedure(s) Performed: CESAREAN SECTION WITH BILATERAL TUBAL LIGATION (Bilateral )     Patient location during evaluation: PACU Anesthesia Type: Spinal Level of consciousness: awake and alert Pain management: pain level controlled Vital Signs Assessment: post-procedure vital signs reviewed and stable Respiratory status: spontaneous breathing, nonlabored ventilation and respiratory function stable Cardiovascular status: blood pressure returned to baseline and stable Postop Assessment: no apparent nausea or vomiting Anesthetic complications: no   No complications documented.  Last Vitals:  Vitals:   11/02/20 1130 11/02/20 1157  BP: 99/65 115/71  Pulse: 65 60  Resp: 20 18  Temp: 36.7 C 36.7 C  SpO2: 100% 100%    Last Pain:  Vitals:   11/02/20 1157  TempSrc: Oral  PainSc:    Pain Goal: Patients Stated Pain Goal: 3 (11/02/20 1130)                 Lowella Curb

## 2020-11-02 NOTE — Anesthesia Preprocedure Evaluation (Signed)
Anesthesia Evaluation  Patient identified by MRN, date of birth, ID band Patient awake    Reviewed: Allergy & Precautions, NPO status , Patient's Chart, lab work & pertinent test results  Airway Mallampati: II  TM Distance: >3 FB Neck ROM: Full    Dental no notable dental hx. (+) Caps   Pulmonary neg pulmonary ROS, former smoker,    Pulmonary exam normal breath sounds clear to auscultation       Cardiovascular negative cardio ROS Normal cardiovascular exam Rhythm:Regular Rate:Normal     Neuro/Psych negative neurological ROS  negative psych ROS   GI/Hepatic negative GI ROS, Neg liver ROS,   Endo/Other  negative endocrine ROSdiabetes  Renal/GU negative Renal ROS  negative genitourinary   Musculoskeletal negative musculoskeletal ROS (+)   Abdominal   Peds negative pediatric ROS (+)  Hematology negative hematology ROS (+)   Anesthesia Other Findings   Reproductive/Obstetrics (+) Pregnancy                             Anesthesia Physical  Anesthesia Plan  ASA: II  Anesthesia Plan: Spinal   Post-op Pain Management:    Induction:   PONV Risk Score and Plan:   Airway Management Planned: Natural Airway  Additional Equipment:   Intra-op Plan:   Post-operative Plan:   Informed Consent: I have reviewed the patients History and Physical, chart, labs and discussed the procedure including the risks, benefits and alternatives for the proposed anesthesia with the patient or authorized representative who has indicated his/her understanding and acceptance.     Dental advisory given  Plan Discussed with:   Anesthesia Plan Comments:         Anesthesia Quick Evaluation

## 2020-11-02 NOTE — Anesthesia Procedure Notes (Signed)
Spinal  Patient location during procedure: OB Start time: 11/02/2020 9:12 AM End time: 11/02/2020 9:17 AM Reason for block: surgical anesthesia Staffing Performed: anesthesiologist  Anesthesiologist: Lowella Curb, MD Preanesthetic Checklist Completed: patient identified, IV checked, risks and benefits discussed, surgical consent, monitors and equipment checked, pre-op evaluation and timeout performed Spinal Block Patient position: sitting Prep: DuraPrep and site prepped and draped Patient monitoring: heart rate, cardiac monitor, continuous pulse ox and blood pressure Approach: midline Location: L3-4 Injection technique: single-shot Needle Needle type: Pencan  Needle gauge: 24 G Needle length: 10 cm Assessment Sensory level: T4 Events: CSF return

## 2020-11-03 LAB — SURGICAL PATHOLOGY

## 2020-11-03 LAB — CBC
HCT: 28.1 % — ABNORMAL LOW (ref 36.0–46.0)
Hemoglobin: 8.9 g/dL — ABNORMAL LOW (ref 12.0–15.0)
MCH: 27.6 pg (ref 26.0–34.0)
MCHC: 31.7 g/dL (ref 30.0–36.0)
MCV: 87.3 fL (ref 80.0–100.0)
Platelets: 288 10*3/uL (ref 150–400)
RBC: 3.22 MIL/uL — ABNORMAL LOW (ref 3.87–5.11)
RDW: 13.2 % (ref 11.5–15.5)
WBC: 15.5 10*3/uL — ABNORMAL HIGH (ref 4.0–10.5)
nRBC: 0 % (ref 0.0–0.2)

## 2020-11-03 MED ORDER — FERROUS FUMARATE 324 (106 FE) MG PO TABS
1.0000 | ORAL_TABLET | ORAL | Status: DC
  Administered 2020-11-03: 106 mg via ORAL
  Filled 2020-11-03: qty 1

## 2020-11-03 NOTE — Progress Notes (Signed)
Subjective: Postpartum Day 1: Cesarean Delivery  Patient is doing well without complaints. However, not voiding or passing flatus. Ambulating without difficulty.  Tolerating PO. Abdominal pain improved. Vaginal bleeding decreased.  Objective: Vital signs in last 24 hours: Temp:  [97.8 F (36.6 C)-99 F (37.2 C)] 98.3 F (36.8 C) (04/28 0430) Pulse Rate:  [56-73] 60 (04/28 0430) Resp:  [12-25] 16 (04/28 0430) BP: (99-121)/(60-78) 116/78 (04/28 0430) SpO2:  [98 %-100 %] 100 % (04/28 0430) Weight:  [101.6 kg] 101.6 kg (04/27 0745)  Physical Exam:  General: alert, cooperative and no distress Lochia: appropriate Uterine Fundus: firm Incision: clear, dry, intact DVT Evaluation: No evidence of DVT seen on physical exam.  Recent Labs    10/31/20 1030 11/03/20 0444  HGB 9.6* 8.9*  HCT 30.2* 28.1*    Assessment/Plan: Julia Santiago is a 30 y.o G3P3003 s/p rLTCS. Doing well postoperatively.   #MOF: Bottle, only pumping for colostrum. #MOC: BTL #Circ: n/a #Anemia of pregnancy: hgb 9.6>8.9, administer IV iron.  #Rubella equivocal: MMR postpartum #Depression: mood stable. Elects to start zoloft 50 mg postpartum. Willing to start in hospital or at home. #ASCUS with positive high risk HPV cervical 04/26/20 - needs colposcopy outpatient. #Post partum milestones: recheck voiding and passing flatus.  #HistoryofGDM: not in this pregnancy #Continue current care.  Sharol Harness Rosado 11/03/2020, 7:39 AM

## 2020-11-03 NOTE — Lactation Note (Signed)
This note was copied from a baby's chart. Lactation Consultation Note  Patient Name: Julia Santiago Today's Date: 11/03/2020   Age:29 hours P3, 31 hour female term infant -3% weight loss. Mom 's  feeding choice is to breastfeed briefly only while in hospital, 1st day only although she breastfeed infant twice today  but mostly  she is formula feed infant.   Once discharge she plans to only formula feed infant.  Mom doesn't plan to use DEBP. No follow up is need by [facility] services.  Maternal Data    Feeding Nipple Type: Extra Slow Flow  LATCH Score                    Lactation Tools Discussed/Used    Interventions    Discharge    Consult Status      Danelle Earthly 11/03/2020, 5:30 PM

## 2020-11-03 NOTE — Social Work (Signed)
CSW received consult for hx of Depression and due to a score of 11 on the Edinburgh Postpartum Depression Scale.  CSW met with MOB to offer support and complete assessment.     CSW introduced self and role. CSW observed infant 'Summer' sleeping in the bassinet. CSW informed MOB of reason for consult and assessed her current mood. MOB was pleasant and observed smiling. MOB reported she is currently doing well and that her pregnancy went great. MOB disclosed she's experiencing some anxiousness related to adjusting to life with 3 children. MOB expressed she also goes in and out of depression due to dealing with not working. CSW validated MOB feelings and assessed how she copes with the feelings. MOB stated she was started on Zoloft today and found it to be very helpful when she was on it 4-5 years ago. MOB reported she was diagnosed with anxiety and depression in 2011 and was started on Zoloft following the birth of her first child. CSW asked MOB if she has ever tried therapy, to cope with her feelings. MOB reported she has not tried therapy, however she expressed interest in resources provided by CSW. CSW identified FOB as her primary support and denies any SI, HI or being involved in DV.   CSW provided education regarding the baby blues period versus PPD and provided resources. MOB disclosed she experienced PPD with her previous two children. MOB reported it presented as isolation and last the entire first year. MOB stated she is currently feeling better initially, than she felt following the previous two births.  CSW provided the New Mom Checklist and encouraged MOB to self evaluate and contact a medical professional if symptoms are noted at any time.  CSW provided review of Sudden Infant Death Syndrome (SIDS) precautions. MOB has all essentials for infant, including a bassinet. MOB denies any barriers to follow-up care. MO reported she has no additional needs at this time.     CSW identifies no further need  for intervention and no barriers to discharge at this time.  Psalms Olarte, LCSWA Clinical Social Work [facility] [phone]  

## 2020-11-04 MED ORDER — SERTRALINE HCL 50 MG PO TABS: 50.0000 mg | ORAL_TABLET | Freq: Every day | ORAL | 1 refills | Status: DC

## 2020-11-04 MED ORDER — ACETAMINOPHEN 325 MG PO TABS: 650.0000 mg | ORAL_TABLET | Freq: Four times a day (QID) | ORAL | Status: DC

## 2020-11-04 MED ORDER — OXYCODONE HCL 5 MG PO TABS: 5.0000 mg | ORAL_TABLET | Freq: Four times a day (QID) | ORAL | 0 refills | Status: DC | PRN

## 2020-11-04 MED ORDER — COCONUT OIL OIL: 1.0000 "application " | TOPICAL_OIL | 0 refills | Status: DC | PRN

## 2020-11-04 MED ORDER — IBUPROFEN 600 MG PO TABS: 600.0000 mg | ORAL_TABLET | Freq: Four times a day (QID) | ORAL | 0 refills | Status: DC

## 2020-11-10 ENCOUNTER — Ambulatory Visit (INDEPENDENT_AMBULATORY_CARE_PROVIDER_SITE_OTHER): Payer: Medicaid Other

## 2020-11-10 ENCOUNTER — Other Ambulatory Visit: Payer: Self-pay

## 2020-11-10 ENCOUNTER — Encounter: Payer: Self-pay | Admitting: *Deleted

## 2020-11-10 VITALS — BP 122/78 | HR 71

## 2020-11-10 DIAGNOSIS — Z5189 Encounter for other specified aftercare: Secondary | ICD-10-CM

## 2020-11-10 NOTE — Progress Notes (Signed)
Subjective:     Julia Santiago is a 30 y.o. female who presents to the clinic one week  status post cesarean on 11/02/2020. She is eating a regular  diet with-out difficulty. Bowel movements are normal.  Review of Systems  Objective:    BP 122/78   Pulse 71   LMP 02/03/2020  General:  Well-appearancce  Abdomen: {post op abd exam:soft","bowel sounds active","non-tender  Incision:   {incision:no dehiscence","incision well approximated","healing well","no drainage","no erythema","no hernia","no seroma","no swelling     Assessment:   Doing well   Plan:    1. Continue any current medications. 2. Wound care discussed. 3. Activity restrictions: As tolerated 4. Anticipated return to work: After postpartum check. 5. Follow up:  Postpartum appointment in June   Tracie Dore Emeline Darling, CMA

## 2020-11-15 ENCOUNTER — Telehealth: Payer: Self-pay | Admitting: Licensed Clinical Social Worker

## 2020-11-15 ENCOUNTER — Encounter: Payer: Medicaid Other | Admitting: Licensed Clinical Social Worker

## 2020-11-15 NOTE — Telephone Encounter (Signed)
Called Ms. Palomarez regarding scheduled appt. Left message for callback

## 2020-12-12 ENCOUNTER — Ambulatory Visit: Payer: Medicaid Other | Admitting: Obstetrics and Gynecology

## 2020-12-19 ENCOUNTER — Ambulatory Visit (INDEPENDENT_AMBULATORY_CARE_PROVIDER_SITE_OTHER): Payer: Medicaid Other | Admitting: Obstetrics and Gynecology

## 2020-12-19 ENCOUNTER — Encounter: Payer: Self-pay | Admitting: Obstetrics and Gynecology

## 2020-12-19 ENCOUNTER — Other Ambulatory Visit: Payer: Self-pay

## 2020-12-19 DIAGNOSIS — R87610 Atypical squamous cells of undetermined significance on cytologic smear of cervix (ASC-US): Secondary | ICD-10-CM | POA: Diagnosis not present

## 2020-12-19 DIAGNOSIS — O99340 Other mental disorders complicating pregnancy, unspecified trimester: Secondary | ICD-10-CM

## 2020-12-19 DIAGNOSIS — R87810 Cervical high risk human papillomavirus (HPV) DNA test positive: Secondary | ICD-10-CM | POA: Diagnosis not present

## 2020-12-19 DIAGNOSIS — F32A Depression, unspecified: Secondary | ICD-10-CM

## 2020-12-19 MED ORDER — SERTRALINE HCL 50 MG PO TABS: 100.0000 mg | ORAL_TABLET | Freq: Every day | ORAL | 5 refills | Status: DC

## 2020-12-19 NOTE — Progress Notes (Signed)
Post Partum Visit Note  Julia Santiago is a 30 y.o. G32P3003 female who presents for a postpartum visit. She is 7 weeks postpartum following a repeat cesarean section.  I have fully reviewed the prenatal and intrapartum course. The delivery was at 39.0 gestational weeks.  Anesthesia: epidural. Postpartum course has been unremarkable. Baby is doing well. Baby is feeding by both breast and bottle - Similac Total Care . Bleeding no bleeding. Bowel function is normal. Bladder function is normal. Patient is sexually active. Contraception method is tubal ligation. Postpartum depression screening: negative =4   The pregnancy intention screening data noted above was reviewed. Potential methods of contraception were discussed. The patient elected to proceed with Female Sterilization.    Edinburgh Postnatal Depression Scale - 12/19/20 1454       Edinburgh Postnatal Depression Scale:  In the Past 7 Days   I have been able to laugh and see the funny side of things. 0    I have looked forward with enjoyment to things. 0    I have blamed myself unnecessarily when things went wrong. 2    I have been anxious or worried for no good reason. 2    I have felt scared or panicky for no good reason. 0    Things have been getting on top of me. 0    I have been so unhappy that I have had difficulty sleeping. 0    I have felt sad or miserable. 0    I have been so unhappy that I have been crying. 0    The thought of harming myself has occurred to me. 0    Edinburgh Postnatal Depression Scale Total 4             Health Maintenance Due  Topic Date Due   PNEUMOCOCCAL POLYSACCHARIDE VACCINE AGE 34-64 HIGH RISK  Never done   COVID-19 Vaccine (1) Never done   Pneumococcal Vaccine 19-81 Years old (1 - PCV) Never done   FOOT EXAM  Never done   OPHTHALMOLOGY EXAM  Never done   URINE MICROALBUMIN  Never done   HEMOGLOBIN A1C  10/25/2020    Medical records reviewed  Review of Systems Pertinent items noted  in HPI and remainder of comprehensive ROS otherwise negative.  Objective:  LMP 02/03/2020    General:  alert   Breasts:  normal  Lungs: clear to auscultation bilaterally  Heart:  regular rate and rhythm, S1, S2 normal, no murmur, click, rub or gallop  Abdomen: soft, non-tender; bowel sounds normal; no masses,  no organomegaly   Wound well approximated incision  GU exam:  not indicated       Assessment:    There are no diagnoses linked to this encounter.  NL postpartum exam.  Abnormal pap smear  Plan:   Essential components of care per ACOG recommendations:  1.  Mood and well being: Patient with negative depression screening today. Reviewed local resources for support.  - Patient tobacco use? No.   - hx of drug use? No.    2. Infant care and feeding:  -Patient currently breastmilk feeding? No.  -Social determinants of health (SDOH) reviewed in EPIC. No concerns  3. Sexuality, contraception and birth spacing - Patient does not want a pregnancy in the next year.  Desired family size is Completed   - Reviewed forms of contraception in tiered fashion. Patient desired bilateral tubal ligation completed in hospital     4. Sleep and fatigue -Encouraged family/partner/community  support of 4 hrs of uninterrupted sleep to help with mood and fatigue  5. Physical Recovery  - Discussed patients delivery and complications. She describes her labor as good. - Patient had a C-section repeat; no problems after deliver.  Patient expressed understanding - Patient has urinary incontinence? No. - Patient is safe to resume physical and sexual activity  6.  Health Maintenance - HM due items addressed Yes - Last pap smear  Diagnosis  Date Value Ref Range Status  04/26/2020 (A)  Final   - Atypical squamous cells of undetermined significance (ASC-US)   Pap smear not done at today's visit.  -Breast Cancer screening indicated? No.   7. Chronic Disease/Pregnancy Condition follow up:  None  - PCP follow up  Repeat pap smear in Oct  Center for [employer], [facility]

## 2020-12-19 NOTE — Patient Instructions (Signed)
Health Maintenance, Female Adopting a healthy lifestyle and getting preventive care are important in promoting health and wellness. Ask your health care provider about: The right schedule for you to have regular tests and exams. Things you can do on your own to prevent diseases and keep yourself healthy. What should I know about diet, weight, and exercise? Eat a healthy diet  Eat a diet that includes plenty of vegetables, fruits, low-fat dairy products, and lean protein. Do not eat a lot of foods that are high in solid fats, added sugars, or sodium.  Maintain a healthy weight Body mass index (BMI) is used to identify weight problems. It estimates body fat based on height and weight. Your health care provider can help determineyour BMI and help you achieve or maintain a healthy weight. Get regular exercise Get regular exercise. This is one of the most important things you can do for your health. Most adults should: Exercise for at least 150 minutes each week. The exercise should increase your heart rate and make you sweat (moderate-intensity exercise). Do strengthening exercises at least twice a week. This is in addition to the moderate-intensity exercise. Spend less time sitting. Even light physical activity can be beneficial. Watch cholesterol and blood lipids Have your blood tested for lipids and cholesterol at 29 years of age, then havethis test every 5 years. Have your cholesterol levels checked more often if: Your lipid or cholesterol levels are high. You are older than 29 years of age. You are at high risk for heart disease. What should I know about cancer screening? Depending on your health history and family history, you may need to have cancer screening at various ages. This may include screening for: Breast cancer. Cervical cancer. Colorectal cancer. Skin cancer. Lung cancer. What should I know about heart disease, diabetes, and high blood pressure? Blood pressure and heart  disease High blood pressure causes heart disease and increases the risk of stroke. This is more likely to develop in people who have high blood pressure readings, are of African descent, or are overweight. Have your blood pressure checked: Every 3-5 years if you are 18-39 years of age. Every year if you are 40 years old or older. Diabetes Have regular diabetes screenings. This checks your fasting blood sugar level. Have the screening done: Once every three years after age 40 if you are at a normal weight and have a low risk for diabetes. More often and at a younger age if you are overweight or have a high risk for diabetes. What should I know about preventing infection? Hepatitis B If you have a higher risk for hepatitis B, you should be screened for this virus. Talk with your health care provider to find out if you are at risk forhepatitis B infection. Hepatitis C Testing is recommended for: Everyone born from 1945 through 1965. Anyone with known risk factors for hepatitis C. Sexually transmitted infections (STIs) Get screened for STIs, including gonorrhea and chlamydia, if: You are sexually active and are younger than 29 years of age. You are older than 29 years of age and your health care provider tells you that you are at risk for this type of infection. Your sexual activity has changed since you were last screened, and you are at increased risk for chlamydia or gonorrhea. Ask your health care provider if you are at risk. Ask your health care provider about whether you are at high risk for HIV. Your health care provider may recommend a prescription medicine to help   prevent HIV infection. If you choose to take medicine to prevent HIV, you should first get tested for HIV. You should then be tested every 3 months for as long as you are taking the medicine. Pregnancy If you are about to stop having your period (premenopausal) and you may become pregnant, seek counseling before you get  pregnant. Take 400 to 800 micrograms (mcg) of folic acid every day if you become pregnant. Ask for birth control (contraception) if you want to prevent pregnancy. Osteoporosis and menopause Osteoporosis is a disease in which the bones lose minerals and strength with aging. This can result in bone fractures. If you are 65 years old or older, or if you are at risk for osteoporosis and fractures, ask your health care provider if you should: Be screened for bone loss. Take a calcium or vitamin D supplement to lower your risk of fractures. Be given hormone replacement therapy (HRT) to treat symptoms of menopause. Follow these instructions at home: Lifestyle Do not use any products that contain nicotine or tobacco, such as cigarettes, e-cigarettes, and chewing tobacco. If you need help quitting, ask your health care provider. Do not use street drugs. Do not share needles. Ask your health care provider for help if you need support or information about quitting drugs. Alcohol use Do not drink alcohol if: Your health care provider tells you not to drink. You are pregnant, may be pregnant, or are planning to become pregnant. If you drink alcohol: Limit how much you use to 0-1 drink a day. Limit intake if you are breastfeeding. Be aware of how much alcohol is in your drink. In the [place] one drink equals one 12 oz bottle of beer (355 mL), one 5 oz glass of wine (148 mL), or one 1 oz glass of hard liquor (44 mL). General instructions Schedule regular health, dental, and eye exams. Stay current with your vaccines. Tell your health care provider if: You often feel depressed. You have ever been abused or do not feel safe at home. Summary Adopting a healthy lifestyle and getting preventive care are important in promoting health and wellness. Follow your health care provider's instructions about healthy diet, exercising, and getting tested or screened for diseases. Follow your health care provider's  instructions on monitoring your cholesterol and blood pressure. This information is not intended to replace advice given to you by your health care provider. Make sure you discuss any questions you have with your healthcare provider. Document Revised: 06/18/2018 Document Reviewed: 06/18/2018 Elsevier Patient Education  2022 Elsevier Inc.  

## 2021-06-18 ENCOUNTER — Ambulatory Visit (HOSPITAL_COMMUNITY)
Admission: EM | Admit: 2021-06-18 | Discharge: 2021-06-18 | Disposition: A | Discharge status: Home / Self Care | Payer: Medicaid Other | Attending: Urology | Admitting: Urology

## 2021-06-18 DIAGNOSIS — Z91128 Patient's intentional underdosing of medication regimen for other reason: Secondary | ICD-10-CM | POA: Insufficient documentation

## 2021-06-18 DIAGNOSIS — F411 Generalized anxiety disorder: Secondary | ICD-10-CM

## 2021-06-18 DIAGNOSIS — F32A Other mental disorders complicating pregnancy, unspecified trimester: Secondary | ICD-10-CM | POA: Insufficient documentation

## 2021-06-18 DIAGNOSIS — Z638 Other specified problems related to primary support group: Secondary | ICD-10-CM | POA: Insufficient documentation

## 2021-06-18 DIAGNOSIS — F331 Major depressive disorder, recurrent, moderate: Secondary | ICD-10-CM

## 2021-06-18 DIAGNOSIS — T43226A Underdosing of selective serotonin reuptake inhibitors, initial encounter: Secondary | ICD-10-CM | POA: Insufficient documentation

## 2021-06-18 DIAGNOSIS — Z6379 Other stressful life events affecting family and household: Secondary | ICD-10-CM | POA: Insufficient documentation

## 2021-06-18 DIAGNOSIS — O99340 Other mental disorders complicating pregnancy, unspecified trimester: Secondary | ICD-10-CM

## 2021-06-18 DIAGNOSIS — F419 Anxiety disorder, unspecified: Secondary | ICD-10-CM | POA: Insufficient documentation

## 2021-06-18 MED ORDER — HYDROXYZINE HCL 25 MG PO TABS: 25.0000 mg | ORAL_TABLET | Freq: Three times a day (TID) | ORAL | 1 refills | Status: DC | PRN

## 2021-06-18 MED ORDER — SERTRALINE HCL 50 MG PO TABS: 100.0000 mg | ORAL_TABLET | Freq: Every day | ORAL | 2 refills | Status: DC

## 2021-06-18 NOTE — Discharge Instructions (Addendum)
You are encouraged to follow up with [facility] for outpatient treatment.  Walk in/ Open Access Hours: Monday - Friday 8AM - 11AM (To see provider and therapist) - Arrive around 7 or 7:15 to have a better chance of being seen, as slots fill up.   Friday - 1PM - 4PM (To see therapist only)  [facility] [address] [place], [place] [phone]   Discharge recommendations:  You were seen today for generalized anxiety disorder and depression. Please take medication as prescribed and follow-up with out patient provider for medication management. You may come to Open Access for therapy/medication management, see hours of operation above.  Take Hydroxyzine 25mg  by mouth up to 3 times (every 8hours) as needed for anxiety.  Take Sertralin 100mg  by mouth once/day Patient is to take medications as prescribed. Please see information for follow-up appointment with psychiatry and therapy. Please follow up with your primary care provider for all medical related needs.   Therapy: We recommend that patient participate in individual therapy to address mental health concerns.  Medications: The parent/guardian is to contact a medical professional and/or outpatient provider to address any new side effects that develop. Parent/guardian should update outpatient providers of any new medications and/or medication changes.   Atypical antipsychotics: If you are prescribed an atypical antipsychotic, it is recommended that your height, weight, BMI, blood pressure, fasting lipid panel, and fasting blood sugar be monitored by your outpatient providers.  Safety:  The patient should abstain from use of illicit substances/drugs and abuse of any medications. If symptoms worsen or do not continue to improve or if the patient becomes actively suicidal or homicidal then it is recommended that the patient return to the closest hospital emergency department, the [facility], or call 911 for further evaluation and treatment. National Suicide Prevention Lifeline 1-800-SUICIDE or 548-234-8637.  About 988 988 offers 24/7 access to trained crisis counselors who can help people experiencing mental health-related distress. People can call or text 988 or chat 988lifeline.org for themselves or if they are worried about a loved one who may need crisis support.
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
## 2021-06-26 ENCOUNTER — Telehealth (HOSPITAL_COMMUNITY): Payer: Self-pay | Admitting: Emergency Medicine

## 2021-06-26 NOTE — BH Assessment (Signed)
Care Management - Follow Up Discharges   Writer attempted to make contact with patient today and was unsuccessful.  Writer left a HIPPA compliant voice message.   Per chart review, patient was provided with outpatient resources.   

## 2021-07-10 ENCOUNTER — Ambulatory Visit (HOSPITAL_COMMUNITY)
Admission: EM | Admit: 2021-07-10 | Discharge: 2021-07-10 | Disposition: A | Discharge status: Home / Self Care | Payer: Medicaid Other | Attending: Psychiatry | Admitting: Psychiatry

## 2021-07-10 DIAGNOSIS — Z79899 Other long term (current) drug therapy: Secondary | ICD-10-CM | POA: Insufficient documentation

## 2021-07-10 DIAGNOSIS — F330 Major depressive disorder, recurrent, mild: Secondary | ICD-10-CM

## 2021-07-10 DIAGNOSIS — O99340 Other mental disorders complicating pregnancy, unspecified trimester: Secondary | ICD-10-CM

## 2021-07-10 DIAGNOSIS — F32A Depression, unspecified: Secondary | ICD-10-CM

## 2021-07-10 MED ORDER — SERTRALINE HCL 100 MG PO TABS: 100.0000 mg | ORAL_TABLET | Freq: Every day | ORAL | 0 refills | Status: DC

## 2021-07-10 MED ORDER — BUSPIRONE HCL 5 MG PO TABS: 5.0000 mg | ORAL_TABLET | Freq: Two times a day (BID) | ORAL | 0 refills | Status: DC

## 2021-07-10 NOTE — Progress Notes (Signed)
Patient discharged by provider.  Discharge information reviewed with patient by provider.  Patient discharged in stable condition; no acute distress noted.

## 2021-07-10 NOTE — Progress Notes (Signed)
°   07/10/21 0757  [place] (Walk-ins at [facility] only)  What Is the Reason for Your Visit/Call Today? 30 year old female present to [facility] seeking assistance with finding a therapist and to discuss her medication that was prescribed to her on 06/18/2021. Patient denied suicidal/homicidal ideations and denied auditory/visual hallucinations. Since her last on 06/18/2021 patient feels the medication is helping her with her mental health symptoms but feels the medication side effects of feeling sleepy and tired.  How Long Has This Been Causing You Problems? <Week  Have You Recently Had Any Thoughts About Hurting Yourself? No  Are You Planning to Commit Suicide/Harm Yourself At This time? No  Have you Recently Had Thoughts About [place]? No  Are You Planning To Harm Someone At This Time? No  Are you currently experiencing any auditory, visual or other hallucinations? No  Have You Used Any Alcohol or Drugs in the Past 24 Hours? No  Do you have any current medical co-morbidities that require immediate attention? No  Clinician description of patient physical appearance/behavior: Patient present with a calm effect and dressed appropriately for the weather.  What Do You Feel Would Help You the Most Today? Medication(s);Stress Management  If access to [facility] was not available, would you have sought care in the Emergency Department? No  Determination of Need Routine (7 days)  Options For Referral Medication Management

## 2021-07-10 NOTE — ED Provider Notes (Signed)
Behavioral Health Urgent Care Medical Screening Exam  Patient Name: Julia Santiago MRN: [id number] Date of Evaluation: 07/10/21 Chief Complaint:   Diagnosis:  Final diagnoses:  MDD (major depressive disorder), recurrent episode, mild ([place])    History of Present illness: Julia Santiago is a 31 y.o. female patient presented to [facility] as a walk in alone with complaints of "they checked me in by accident, I was trying to do a therapy walk in for upstairs".  Julia Santiago, 31 y.o., female patient seen face to face by this provider, consulted with Dr. Serafina Mitchell; and chart reviewed on 07/10/21.  On evaluation Julia Santiago reports she was seen at [facility] on 06/18/2021. At that time her Zoloft 100 mg QD was restarted and she was prescribed Hydroxyzine 25 PO TID PRN for anxiety. She has tolerated medications with out any adverse reactions. However, she states the hydroxyzine makes her too sleepy. She is interested in a medication for anxiety that will not make her sleepy. She was also given resources for open access walk in hours for Behavioral Health outpatient services on the second floor. Patient was trying to do a walk in for therapy today. Services on the second floor is closed today due to holiday.  During evaluation Julia Santiago is in sitting position in no acute distress. She makes good eye contact.  She is alert/oriented x 4 and cooperative. She is well groomed. She endorses depression and anxiety. Reports the Zoloft and hydroxyzine has helped but she is still very anxious at times. Reports a decrease in concentration and low engery. She reports broken sleep and no concerns with appetite  Her thought process is coherent and relevant. There is no indication that she is currently responding to internal/external stimuli or experiencing delusional thought content. She deni suicidal/self-harm/homicidal ideation, psychosis, and paranoia.  She contracts for safety. Denies access to  firearms/weapons. Her children are her protective factor. She does not breastfeed. Patient  answered questions appropriately.    Patient states her Zoloft prescription will run out soon and is requested refill. Discussed Buspar 5 mg BID for anxiety. Patient agreed. Educated that if anxiety symptoms don't improve in one week, she can increase to 10 mg BID. Prescriptions sent to patients pharmacy. Discussed if Zoloft is not effective she could discuss possibly switching to Wellbutrin with out patient provider to help with low energy and decreased concentration.   At this time Julia Santiago is educated and verbalizes understanding of mental health resources and other crisis services in the community. She is instructed to call 911 and present to the nearest emergency room should she experience any suicidal/homicidal ideation, auditory/visual/hallucinations, or detrimental worsening of her  mental health condition.  She was a also advised by Probation officer that she could call the toll-free phone on insurance card to assist with identifying in network counselors and agencies or number on back of Medicaid card t speak with care coordinator    Psychiatric Specialty Exam  Presentation  General Appearance:Appropriate for Environment; Well Groomed  Eye Contact:Good  Speech:Clear and Coherent; Normal Rate  Speech Volume:Normal  Handedness:Right   Mood and Affect  Mood:Anxious; Depressed  Affect:Congruent   Thought Process  Thought Processes:Coherent  Descriptions of Associations:Intact  Orientation:Full (Time, Place and Person)  Thought Content:Logical    Hallucinations:None  Ideas of Reference:None  Suicidal Thoughts:No  Homicidal Thoughts:No   Sensorium  Memory:Immediate Good; Recent Good; Remote Good  Judgment:Good  Insight:Good   Executive Functions  Concentration:Good  Attention Span:Good  [place]  Language:Good   Psychomotor Activity   Psychomotor Activity:Normal   Assets  Assets:Communication Skills; Desire for Improvement; Financial Resources/Insurance; Physical Health; Resilience   Sleep  Sleep:Good  Number of hours: No data recorded  No data recorded  Physical Exam: Physical Exam Vitals and nursing note reviewed.  Constitutional:      General: She is not in acute distress.    Appearance: Normal appearance. She is well-developed. She is not ill-appearing.  HENT:     Head: Normocephalic and atraumatic.  Eyes:     General:        Right eye: No discharge.        Left eye: No discharge.     Conjunctiva/sclera: Conjunctivae normal.  Cardiovascular:     Rate and Rhythm: Normal rate and regular rhythm.     Heart sounds: No murmur heard. Pulmonary:     Effort: Pulmonary effort is normal. No respiratory distress.  Musculoskeletal:        General: Normal range of motion.     Cervical back: Normal range of motion and neck supple.  Skin:    Coloration: Skin is not jaundiced or pale.  Neurological:     Mental Status: She is alert and oriented to person, place, and time.  Psychiatric:        Attention and Perception: Attention normal.        Mood and Affect: Mood is anxious and depressed.        Speech: Speech normal.        Behavior: Behavior is cooperative.        Thought Content: Thought content normal.        Cognition and Memory: Cognition normal.        Judgment: Judgment normal.   Review of Systems  Constitutional: Negative.   HENT: Negative.    Eyes: Negative.   Respiratory: Negative.    Cardiovascular: Negative.   Musculoskeletal: Negative.   Skin: Negative.   Neurological: Negative.   Psychiatric/Behavioral:  Positive for depression. The patient is nervous/anxious.   Blood pressure (!) 130/96, pulse 77, temperature 98.8 F (37.1 C), temperature source Oral, resp. rate 18, SpO2 100 %, unknown if currently breastfeeding. There is no height or weight on file to calculate  BMI.  Musculoskeletal: Strength & Muscle Tone: within normal limits Gait & Station: normal Patient leans: N/A   [place] MSE Discharge Disposition for Follow up and Recommendations: Based on my evaluation the patient does not appear to have an emergency medical condition and can be discharged with resources and follow up care in outpatient services for Medication Management and Individual Therapy  Discharge Patient   Patient states her Zoloft prescription will run out soon and is requested refill. Discussed Buspar 5 mg BID for anxiety. Patient agreed. Educated that if anxiety symptoms don't improve in one week, she can increase to 10 mg BID. Prescriptions sent to patients pharmacy.  No evidence of imminent risk to self or others at present.    Patient does not meet criteria for psychiatric inpatient admission. Discussed crisis plan, support from social network, calling 911, coming to the Emergency Department, and calling Suicide Hotline.    Revonda Humphrey, NP 07/10/2021, 9:59 AM

## 2021-07-10 NOTE — Discharge Instructions (Addendum)

## 2021-07-17 ENCOUNTER — Other Ambulatory Visit: Payer: Self-pay

## 2021-07-17 ENCOUNTER — Ambulatory Visit (INDEPENDENT_AMBULATORY_CARE_PROVIDER_SITE_OTHER): Payer: Medicaid Other | Admitting: Licensed Clinical Social Worker

## 2021-07-17 ENCOUNTER — Encounter (HOSPITAL_COMMUNITY): Payer: Self-pay | Admitting: Licensed Clinical Social Worker

## 2021-07-17 DIAGNOSIS — F411 Generalized anxiety disorder: Secondary | ICD-10-CM | POA: Diagnosis not present

## 2021-07-17 DIAGNOSIS — F331 Major depressive disorder, recurrent, moderate: Secondary | ICD-10-CM

## 2021-07-17 NOTE — Plan of Care (Signed)
Pt agreeable to plan  ?
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
## 2021-07-21 ENCOUNTER — Telehealth (HOSPITAL_COMMUNITY): Payer: Self-pay | Admitting: Internal Medicine

## 2021-07-21 NOTE — BH Assessment (Signed)
Care Management - [facility]UC Follow Up Discharges   Writer attempted to make contact with patient today and was unsuccessful.  Writer left a HIPPA compliant voice message.   Per chart review, patient had a follow up appointment with LCSW, Richardson Dopp on 07-17-2021.

## 2021-08-07 ENCOUNTER — Ambulatory Visit (INDEPENDENT_AMBULATORY_CARE_PROVIDER_SITE_OTHER): Payer: Medicaid Other | Admitting: Licensed Clinical Social Worker

## 2021-08-07 ENCOUNTER — Other Ambulatory Visit: Payer: Self-pay

## 2021-08-07 DIAGNOSIS — F331 Major depressive disorder, recurrent, moderate: Secondary | ICD-10-CM

## 2021-08-07 DIAGNOSIS — F411 Generalized anxiety disorder: Secondary | ICD-10-CM
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
## 2021-08-29 ENCOUNTER — Ambulatory Visit (HOSPITAL_COMMUNITY): Payer: Self-pay | Admitting: Licensed Clinical Social Worker

## 2021-09-11 ENCOUNTER — Encounter (HOSPITAL_COMMUNITY): Payer: Self-pay | Admitting: Psychiatry

## 2021-09-11 ENCOUNTER — Other Ambulatory Visit: Payer: Self-pay

## 2021-09-11 ENCOUNTER — Ambulatory Visit (INDEPENDENT_AMBULATORY_CARE_PROVIDER_SITE_OTHER): Payer: Medicaid Other | Admitting: Psychiatry

## 2021-09-11 VITALS — BP 138/103 | HR 89 | Ht 68.0 in | Wt 211.0 lb

## 2021-09-11 DIAGNOSIS — O99340 Other mental disorders complicating pregnancy, unspecified trimester: Secondary | ICD-10-CM

## 2021-09-11 DIAGNOSIS — F32A Depression, unspecified: Secondary | ICD-10-CM

## 2021-09-11 DIAGNOSIS — F411 Generalized anxiety disorder: Secondary | ICD-10-CM

## 2021-09-11 DIAGNOSIS — F331 Major depressive disorder, recurrent, moderate: Secondary | ICD-10-CM

## 2021-09-11 MED ORDER — SERTRALINE HCL 100 MG PO TABS: 100.0000 mg | ORAL_TABLET | Freq: Every day | ORAL | 1 refills | Status: DC

## 2021-09-11 MED ORDER — HYDROXYZINE HCL 25 MG PO TABS: 25.0000 mg | ORAL_TABLET | Freq: Every evening | ORAL | 1 refills | Status: DC | PRN

## 2021-09-11 MED ORDER — BUSPIRONE HCL 15 MG PO TABS: 15.0000 mg | ORAL_TABLET | Freq: Two times a day (BID) | ORAL | 1 refills | Status: DC
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
## 2021-09-15 ENCOUNTER — Emergency Department (HOSPITAL_COMMUNITY): Payer: Medicaid Other

## 2021-09-15 ENCOUNTER — Emergency Department (HOSPITAL_COMMUNITY)
Admission: EM | Admit: 2021-09-15 | Discharge: 2021-09-16 | Disposition: A | Discharge status: Home / Self Care | Payer: Medicaid Other | Attending: Emergency Medicine | Admitting: Emergency Medicine

## 2021-09-15 ENCOUNTER — Encounter (HOSPITAL_COMMUNITY): Payer: Self-pay

## 2021-09-15 DIAGNOSIS — Z87891 Personal history of nicotine dependence: Secondary | ICD-10-CM | POA: Insufficient documentation

## 2021-09-15 DIAGNOSIS — U071 COVID-19: Secondary | ICD-10-CM | POA: Insufficient documentation

## 2021-09-15 LAB — BASIC METABOLIC PANEL
Anion gap: 13 (ref 5–15)
BUN: 9 mg/dL (ref 6–20)
CO2: 20 mmol/L — ABNORMAL LOW (ref 22–32)
Calcium: 8.7 mg/dL — ABNORMAL LOW (ref 8.9–10.3)
Chloride: 101 mmol/L (ref 98–111)
Creatinine, Ser: 0.9 mg/dL (ref 0.44–1.00)
GFR, Estimated: >60 mL/min (ref >60)
Glucose, Bld: 137 mg/dL — ABNORMAL HIGH (ref 70–99)
Potassium: 3.2 mmol/L — ABNORMAL LOW (ref 3.5–5.1)
Sodium: 134 mmol/L — ABNORMAL LOW (ref 135–145)

## 2021-09-15 LAB — CBC
HCT: 40.3 % (ref 36.0–46.0)
Hemoglobin: 12.7 g/dL (ref 12.0–15.0)
MCH: 28.6 pg (ref 26.0–34.0)
MCHC: 31.5 g/dL (ref 30.0–36.0)
MCV: 90.8 fL (ref 80.0–100.0)
Platelets: 299 10*3/uL (ref 150–400)
RBC: 4.44 MIL/uL (ref 3.87–5.11)
RDW: 13.4 % (ref 11.5–15.5)
WBC: 9.5 10*3/uL (ref 4.0–10.5)
nRBC: 0 % (ref 0.0–0.2)

## 2021-09-15 LAB — I-STAT BETA HCG BLOOD, ED (MC, WL, AP ONLY): I-stat hCG, quantitative: <5 m[IU]/mL (ref <5)

## 2021-09-15 LAB — TROPONIN I (HIGH SENSITIVITY): Troponin I (High Sensitivity): 3 ng/L (ref <18)

## 2021-09-15 NOTE — ED Triage Notes (Signed)
Pt states that she has been having CP that has been going on for several days, worse today, central epigastric pain. Denies n/v, some SOB. Pt also states that she has a rash all over her body that has been there for a month, given prednisone but did not take properly and rash has not resolved  ?

## 2021-09-16 ENCOUNTER — Encounter (HOSPITAL_COMMUNITY): Payer: Self-pay | Admitting: Emergency Medicine

## 2021-09-16 ENCOUNTER — Other Ambulatory Visit: Payer: Self-pay

## 2021-09-16 LAB — RESP PANEL BY RT-PCR (FLU A&B, COVID) ARPGX2
Influenza A by PCR: NEGATIVE
Influenza B by PCR: NEGATIVE
SARS Coronavirus 2 by RT PCR: POSITIVE — AB

## 2021-09-16 LAB — HEPATIC FUNCTION PANEL
ALT: 28 U/L (ref 0–44)
AST: 32 U/L (ref 15–41)
Albumin: 3.6 g/dL (ref 3.5–5.0)
Alkaline Phosphatase: 55 U/L (ref 38–126)
Bilirubin, Direct: 0.4 mg/dL — ABNORMAL HIGH (ref 0.0–0.2)
Indirect Bilirubin: 0.7 mg/dL (ref 0.3–0.9)
Total Bilirubin: 1.1 mg/dL (ref 0.3–1.2)
Total Protein: 6.4 g/dL — ABNORMAL LOW (ref 6.5–8.1)

## 2021-09-16 LAB — TROPONIN I (HIGH SENSITIVITY): Troponin I (High Sensitivity): 5 ng/L (ref <18)

## 2021-09-16 NOTE — Discharge Instructions (Addendum)
You were evaluated in the Emergency Department and after careful evaluation, we did not find any emergent condition requiring admission or further testing in the hospital. ? ?Your exam/testing today is overall reassuring.  Symptoms likely explained by COVID-19.  You tested positive here in the emergency department. ? ?Please return to the Emergency Department if you experience any worsening of your condition.   Thank you for allowing [place] to be a part of your care. ?
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
## 2021-09-25 ENCOUNTER — Other Ambulatory Visit: Payer: Self-pay

## 2021-09-25 ENCOUNTER — Ambulatory Visit (INDEPENDENT_AMBULATORY_CARE_PROVIDER_SITE_OTHER): Payer: Medicaid Other | Admitting: Licensed Clinical Social Worker

## 2021-09-25 DIAGNOSIS — F331 Major depressive disorder, recurrent, moderate: Secondary | ICD-10-CM

## 2021-09-25 DIAGNOSIS — F411 Generalized anxiety disorder: Secondary | ICD-10-CM

## 2021-09-25 NOTE — Progress Notes (Signed)
? ?  THERAPIST PROGRESS NOTE ? ?Session Time: 40 ? ?Participation Level: Active ? ?Behavioral Response: CasualAlertAnxious and Depressed ? ?Type of Therapy: Individual Therapy ? ?Treatment Goals addressed: LTG: Patient will score less than 5 on the Generalized Anxiety Disorder 7 Scale  ?(GAD-7) ? ?ProgressTowards Goals: Progressing ? ?Interventions: CBT, Motivational Interviewing, and Supportive ? ?Summary: Julia Santiago is a 31 y.o. female who presents with euthymic mood\affect.  Patient was pleasant, cooperative, maintained good eye contact.  She engaged well in therapy session and was dressed casually. ? Patient reports primary stressors are family conflict, and financials.  Patient states that her youngest child who is 65 years old is having behavioral problems in his prekindergarten classes.  Patient reports that she has had to call out of work multiple times for his behavioral issues.  She reports that she is connected with a service that is supposed to come out and to mental health therapy at the school but he is never at school due to his behavioral problems.  LCSW did provide patient resources to [facility] family services for at home care.  Patient reports due to her calling out so much for her children such as them getting sick or having behavioral issues she is working less and less.  Patient reports that she would like more formal support through her children's fathers and thinks that it may be time to revisit that the custody agreement.  This is due to the fact that her children's fathers only see them every other weekend. ? ?Suicidal/Homicidal: Nowithout intent/plan ? ?Therapist Response:  ? ? ? Intervention/plan: LCSW administered PHQ-9 and also and GAD-7.  Patient decreased both scores in today's session patient scored a 7 on PHQ-9 and a 6 on GAD-7.  Patient's goal score for PHQ-9 is below 10.  Patient's goal score for GAD-7 is below a 5.  Patient continues to make progress towards goals.  In  today's session patient was educated on her PHQ-9 score and GAD-7 score.  Patient was also given supportive therapy for praise and encouragement.  LCSW provided patient resources for solution focused therapy for family services. ? ?Plan: Return again in 4 weeks. ? ?Diagnosis: Major depressive disorder, recurrent episode, moderate (HCC) ? ?GAD (generalized anxiety disorder)   ? ?Collaboration of Care: Other None is today's session ? ?Patient/Guardian was advised Release of Information must be obtained prior to any record release in order to collaborate their care with an outside provider. Patient/Guardian was advised if they have not already done so to contact the registration department to sign all necessary forms in order for [place] to release information regarding their care.  ? ?Consent: Patient/Guardian gives verbal consent for treatment and assignment of benefits for services provided during this visit. Patient/Guardian expressed understanding and agreed to proceed.  ? ?Weber Cooks, LCSW ?09/25/2021 ? ?

## 2021-09-25 NOTE — Plan of Care (Signed)
Pt met goal of decreasing PHQ-9 below 10.  ?

## 2021-10-05 ENCOUNTER — Ambulatory Visit (HOSPITAL_COMMUNITY): Payer: Medicaid Other | Admitting: Student in an Organized Health Care Education/Training Program

## 2021-10-23 ENCOUNTER — Encounter (HOSPITAL_COMMUNITY): Payer: Medicaid Other | Admitting: Psychiatry

## 2021-10-23 ENCOUNTER — Encounter (HOSPITAL_COMMUNITY): Payer: Self-pay

## 2021-11-15 ENCOUNTER — Ambulatory Visit (HOSPITAL_COMMUNITY): Payer: Medicaid Other | Admitting: Licensed Clinical Social Worker

## 2021-12-06 ENCOUNTER — Ambulatory Visit (INDEPENDENT_AMBULATORY_CARE_PROVIDER_SITE_OTHER): Payer: Medicaid Other | Admitting: Licensed Clinical Social Worker

## 2021-12-06 ENCOUNTER — Encounter (HOSPITAL_COMMUNITY): Payer: Self-pay

## 2021-12-06 DIAGNOSIS — F331 Major depressive disorder, recurrent, moderate: Secondary | ICD-10-CM

## 2021-12-06 DIAGNOSIS — F411 Generalized anxiety disorder: Secondary | ICD-10-CM

## 2021-12-06 NOTE — Plan of Care (Signed)
  Problem: Anxiety Disorder CCP Problem  1 MDD Goal: STG: Patient will attend at least 80% of scheduled family sessions Outcome: Progressing   Problem: Depression CCP Problem  1 GAD Goal: STG: Julia Santiago WILL COMPLETE AT LEAST 80% OF ASSIGNED HOMEWORK Outcome: Progressing   Problem: Anxiety Disorder CCP Problem  1 MDD Goal: LTG: Patient will score less than 5 on the Generalized Anxiety Disorder 7 Scale (GAD-7) Outcome: Not Progressing   Problem: Depression CCP Problem  1 GAD Goal: STG: Julia Santiago WILL ATTEND AT LEAST 80% OF SCHEDULED GROUP PSYCHOTHERAPY SESSIONS Outcome: Not Progressing   Problem: Anxiety Disorder CCP Problem  1 MDD Intervention: Discuss risks and benefits of medication treatment options for this problem and prescribe as indicated Intervention: Encourage patient to take psychotropic medication as prescribed Intervention: Review results of GAD-7 with the patient to track progress Intervention: Work with patient to track symptoms, triggers and/or skill use through a mood chart, diary card, or journal Intervention: Perform psychoeducation regarding anxiety disorders   Problem: Depression CCP Problem  1 GAD Intervention: WORK WITH Julia Santiago TO TRACK SYMPTOMS, TRIGGERS AND/OR SKILL USE THROUGH A MOOD CHART, DIARY CARD, OR JOURNAL Intervention: ENCOURAGE Julia Santiago TO PARTICIPATE IN RECOVERY PEER SUPPORT ACTIVITIES WEEKLY Intervention: Administer the PHQ-9 or MADRS weekly for 4 weeks Intervention: PROVIDE Julia Santiago WITH EDUCATIONAL INFORMATION AND READING MATERIAL ON DISSOCIATION, ITS CAUSES, AND SYMPTOMS Intervention: WORK WITH Julia Santiago TO IDENTIFY THE MAJOR COMPONENTS OF A RECENT EPISODE OF DEPRESSION: PHYSICAL SYMPTOMS, MAJOR THOUGHTS AND IMAGES, AND MAJOR BEHAVIORS THEY EXPERIENCED

## 2021-12-06 NOTE — Plan of Care (Signed)
  Problem: Anxiety Disorder CCP Problem  1 MDD Intervention: Work with patient to track symptoms, triggers and/or skill use through a mood chart, diary card, or journal   Problem: Anxiety Disorder CCP Problem  1 MDD Goal: STG: Patient will attend at least 80% of scheduled family sessions Outcome: Progressing   Problem: Depression CCP Problem  1 GAD Goal: STG: Marcella WILL COMPLETE AT LEAST 80% OF ASSIGNED HOMEWORK Outcome: Progressing   Problem: Anxiety Disorder CCP Problem  1 MDD Intervention: Discuss risks and benefits of medication treatment options for this problem and prescribe as indicated Intervention: Encourage patient to take psychotropic medication as prescribed Intervention: Review results of GAD-7 with the patient to track progress Intervention: Work with patient to track symptoms, triggers and/or skill use through a mood chart, diary card, or journal Intervention: Perform psychoeducation regarding anxiety disorders   Problem: Depression CCP Problem  1 GAD Intervention: WORK WITH Zionah TO TRACK SYMPTOMS, TRIGGERS AND/OR SKILL USE THROUGH A MOOD CHART, DIARY CARD, OR JOURNAL Intervention: ENCOURAGE Meliana TO PARTICIPATE IN RECOVERY PEER SUPPORT ACTIVITIES WEEKLY Intervention: Administer the PHQ-9 or MADRS weekly for 4 weeks Intervention: PROVIDE Marshawn WITH EDUCATIONAL INFORMATION AND READING MATERIAL ON DISSOCIATION, ITS CAUSES, AND SYMPTOMS Intervention: WORK WITH Suzie TO IDENTIFY THE MAJOR COMPONENTS OF A RECENT EPISODE OF DEPRESSION: PHYSICAL SYMPTOMS, MAJOR THOUGHTS AND IMAGES, AND MAJOR BEHAVIORS THEY EXPERIENCED

## 2021-12-06 NOTE — Progress Notes (Signed)
   THERAPIST PROGRESS NOTE  Session Time: 18  Participation Level: Active  Behavioral Response: Casual and Fairly GroomedAlertAnxious and Depressed  Type of Therapy: Individual Therapy  Treatment Goals addressed: LTG: Patient will score less than 5 on the Generalized Anxiety Disorder 7 Scale  (GAD-7)  ProgressTowards Goals: Progressing  Interventions: CBT and Motivational Interviewing  Summary: Julia Santiago is a 31 y.o. female who presents with depressed and anxious mood\affect.  Patient was pleasant, cooperative, maintained good eye contact.  She engaged well in therapy session was dressed casually.  Patient states primary stressors as financials, work, relationship, and family conflict.  Reports that she recently got back with her significant other.  She reports a change for a short period of time but then resorted back to old habits of not prioritizing the children or her.  Patient utilized the example of spending time in his father's man cave watching sports and playing video games every day after work.  Patient reports family conflict for her child being kicked out of daycare.  Patient reports that since being kicked out behaviors have improved and patient feels that the behavior was instigated at the daycare.  Patient reports finding a new daycare, but reports that her son now stays with his grandmother on the weekdays as the daycare is closer to her.  Patient reports recently quitting her job, but finding a new one that she will start next week.  Suicidal/Homicidal: Nowithout intent/plan  Therapist Response:    Intervention/Plan: LCSW administered PHQ-9.  LCSW administered a GAD-7.  LCSW notes no progress with GAD-7 scoring a 6 last time and scoring 6 today.  Patient reports minor increase in PHQ-9 patient remains and mild depression category.  LCSW used CBT therapy to educate patient on boundary setting.  LCSW provided education material on "setting boundaries".  LCSW spoke with  patient on communication involving partners utilizing I  language instead of you language.    Plan: Return again in 4 weeks.  Diagnosis: Major depressive disorder, recurrent episode, moderate (HCC)  GAD (generalized anxiety disorder)  Collaboration of Care: Other None today.   Patient/Guardian was advised Release of Information must be obtained prior to any record release in order to collaborate their care with an outside provider. Patient/Guardian was advised if they have not already done so to contact the registration department to sign all necessary forms in order for [place] to release information regarding their care.   Consent: Patient/Guardian gives verbal consent for treatment and assignment of benefits for services provided during this visit. Patient/Guardian expressed understanding and agreed to proceed.   Dory Horn, LCSW 12/06/2021

## 2021-12-15 ENCOUNTER — Encounter (HOSPITAL_COMMUNITY): Payer: Self-pay | Admitting: Psychiatry

## 2021-12-15 ENCOUNTER — Telehealth (INDEPENDENT_AMBULATORY_CARE_PROVIDER_SITE_OTHER): Payer: Medicaid Other | Admitting: Psychiatry

## 2021-12-15 DIAGNOSIS — F32A Depression, unspecified: Secondary | ICD-10-CM

## 2021-12-15 DIAGNOSIS — F411 Generalized anxiety disorder: Secondary | ICD-10-CM

## 2021-12-15 DIAGNOSIS — O99340 Other mental disorders complicating pregnancy, unspecified trimester: Secondary | ICD-10-CM

## 2021-12-15 MED ORDER — BUSPIRONE HCL 15 MG PO TABS: 15.0000 mg | ORAL_TABLET | Freq: Two times a day (BID) | ORAL | 3 refills | Status: DC

## 2021-12-15 MED ORDER — HYDROXYZINE HCL 25 MG PO TABS: 25.0000 mg | ORAL_TABLET | Freq: Every evening | ORAL | 3 refills | Status: DC | PRN

## 2021-12-15 MED ORDER — SERTRALINE HCL 100 MG PO TABS: 100.0000 mg | ORAL_TABLET | Freq: Every day | ORAL | 3 refills | Status: DC

## 2021-12-15 NOTE — Progress Notes (Signed)
BH MD/PA/NP OP Progress Note Virtual Visit via Video Note  I connected with Julia Santiago on 12/15/21 at 10:30 AM EDT by a video enabled telemedicine application and verified that I am speaking with the correct person using two identifiers.  Location: Patient: Home Provider: Clinic   I discussed the limitations of evaluation and management by telemedicine and the availability of in person appointments. The patient expressed understanding and agreed to proceed.  I provided 30 minutes of non-face-to-face time during this encounter.   12/15/2021 10:40 AM Kirstie Myer HaffD Fauver  MRN:  [id number]  Chief Complaint: "I've been doing really good" HPI: 31 year old female seen today for follow-up psychiatric evaluation.  She has a psychiatric history of anxiety and depression.  She is currently managed on BuSpar 15 milligrams twice daily, hydroxyzine 25 mg nightly, and Zoloft 100 mg daily.  She notes her medications are effective managing her psychiatric condition.  Today she is well-groomed, pleasant, cooperative, engaged in conversation, maintained eye contact.  She informed Clinical research associatewriter that since her last visit she has been doing really well.  She notes that the increase in BuSpar and hydroxyzine has helped manage her anxiety, depression, and sleep.  Today patient notes that she has minimal anxiety and depression.  Provider conducted a GAD-7 and patient scored a 3.  Provider also conducted PHQ-9 and patient scored an 8.  She endorses adequate sleep and fluctuations in her appetite.  Patient notes that she continues to lose weight and reports that she is lost 20 pounds over the last 4 to 5 months.  Today she denies SI/HI/VAH, mania, or paranoia.  No medication changes made today.  Patient asked writer if she can take BuSpar once daily.  She notes that she prefers to take her two 15 mg pills at once.  Provider informed patient that this was fine.  She will continue her other medications as prescribed.  No other  concerns at this time. Visit Diagnosis:    ICD-10-CM   1. GAD (generalized anxiety disorder)  F41.1 busPIRone (BUSPAR) 15 MG tablet    hydrOXYzine (ATARAX) 25 MG tablet    sertraline (ZOLOFT) 100 MG tablet    2. Depression affecting pregnancy, antepartum  O99.340 busPIRone (BUSPAR) 15 MG tablet   F32.A sertraline (ZOLOFT) 100 MG tablet      Past Psychiatric History: Anxiety and depression  Past Medical History:  Past Medical History:  Diagnosis Date   Anxiety    Depression    was on certaline for pp depression   Gestational diabetes    Headache     Past Surgical History:  Procedure Laterality Date   CESAREAN SECTION N/A 01/29/2015   Procedure: CESAREAN SECTION;  Surgeon: Brock Badharles A Harper, MD;  Location: [facility];  Service: Obstetrics;  Laterality: N/A;   CESAREAN SECTION N/A 12/21/2016   Procedure: REPEAT CESAREAN SECTION;  Surgeon: Catalina Antiguaonstant, Peggy, MD;  Location: [facility];  Service: Obstetrics;  Laterality: N/A;   CESAREAN SECTION WITH BILATERAL TUBAL LIGATION Bilateral 11/02/2020   Procedure: CESAREAN SECTION WITH BILATERAL TUBAL LIGATION;  Surgeon: Federico FlakeNewton, Kimberly Niles, MD;  Location: [facility];  Service: Obstetrics;  Laterality: Bilateral;    Family Psychiatric History: Mother anxiety and depression, maternal aunt bipolar disorder, maternal grandmother unknown psychiatric conditions  Family History:  Family History  Problem Relation Age of Onset   Anxiety disorder Mother    Depression Mother    Heart disease Father    Stroke Maternal Grandmother    Arthritis Maternal Grandmother  Heart disease Maternal Grandfather    Alcohol abuse Maternal Grandfather    Cancer Paternal Grandmother     Social History:  Social History   Socioeconomic History   Marital status: Single    Spouse name: Not on file   Number of children: 2   Years of education: Not on file   Highest education level: Not on file  Occupational History   Occupation: UNEMPLOYED  Tobacco  Use   Smoking status: Former    Packs/day: 0.50    Types: Cigarettes    Quit date: 07/2020    Years since quitting: 1.4   Smokeless tobacco: Never  Vaping Use   Vaping Use: Never used  Substance and Sexual Activity   Alcohol use: Yes    Comment: weekends   Drug use: No   Sexual activity: Not Currently    Birth control/protection: None  Other Topics Concern   Not on file  Social History Narrative   Not on file   Social Determinants of Health   Financial Resource Strain: High Risk (07/17/2021)   Overall Financial Resource Strain (CARDIA)    Difficulty of Paying Living Expenses: Hard  Food Insecurity: Food Insecurity Present (07/17/2021)   Hunger Vital Sign    Worried About Running Out of Food in the Last Year: Sometimes true    Ran Out of Food in the Last Year: Sometimes true  Transportation Needs: No Transportation Needs (07/17/2021)   PRAPARE - Administrator, Civil Service (Medical): No    Lack of Transportation (Non-Medical): No  Physical Activity: Inactive (07/17/2021)   Exercise Vital Sign    Days of Exercise per Week: 0 days    Minutes of Exercise per Session: 0 min  Stress: Stress Concern Present (07/17/2021)   [employer] of Occupational Health - Occupational Stress Questionnaire    Feeling of Stress : Rather much  Social Connections: Socially Isolated (07/17/2021)   Social Connection and Isolation Panel [NHANES]    Frequency of Communication with Friends and Family: More than three times a week    Frequency of Social Gatherings with Friends and Family: Never    Attends Religious Services: Never    Database administrator or Organizations: No    Attends Banker Meetings: Never    Marital Status: Never married    Allergies:  Allergies  Allergen Reactions   Penicillins     Has patient had a PCN reaction causing immediate rash, facial/tongue/throat swelling, SOB or lightheadedness with hypotension: No Has patient had a PCN reaction causing  severe rash involving mucus membranes or skin necrosis: No Has patient had a PCN reaction that required hospitalization: No Has patient had a PCN reaction occurring within the last 10 years: No If all of the above answers are "NO", then may proceed with Cephalosporin use.  As a child-reaction unknown    Metabolic Disorder Labs: Lab Results  Component Value Date   HGBA1C 5.6 04/26/2020   No results found for: "PROLACTIN" No results found for: "CHOL", "TRIG", "HDL", "CHOLHDL", "VLDL", "LDLCALC" No results found for: "TSH"  Therapeutic Level Labs: No results found for: "LITHIUM" No results found for: "VALPROATE" No results found for: "CBMZ"  Current Medications: Current Outpatient Medications  Medication Sig Dispense Refill   busPIRone (BUSPAR) 15 MG tablet Take 1 tablet (15 mg total) by mouth 2 (two) times daily. 60 tablet 3   hydrOXYzine (ATARAX) 25 MG tablet Take 1 tablet (25 mg total) by mouth at bedtime as needed for  anxiety (sleep). 30 tablet 3   predniSONE (DELTASONE) 5 MG tablet Take 5 mg by mouth 4 (four) times daily - after meals and at bedtime.     Prenatal Vit-Fe Fumarate-FA (PRENATAL VITAMINS) 28-0.8 MG TABS Take 1 tablet by mouth daily. 60 tablet 3   sertraline (ZOLOFT) 100 MG tablet Take 1 tablet (100 mg total) by mouth daily. 30 tablet 3   No current facility-administered medications for this visit.     Musculoskeletal: Strength & Muscle Tone: within normal limits and telehealth visit Gait & Station: normal, telehealth visit Patient leans: N/A  Psychiatric Specialty Exam: Review of Systems  unknown if currently breastfeeding.There is no height or weight on file to calculate BMI.  General Appearance: Well Groomed  Eye Contact:  Good  Speech:  Clear and Coherent and Normal Rate  Volume:  Normal  Mood:  Euthymic  Affect:  Appropriate and Congruent  Thought Process:  Coherent, Goal Directed, and Linear  Orientation:  Full (Time, Place, and Person)  Thought  Content: WDL and Logical   Suicidal Thoughts:  No  Homicidal Thoughts:  No  Memory:  Immediate;   Good Recent;   Good Remote;   Good  Judgement:  Good  Insight:  Good  Psychomotor Activity:  Normal  Concentration:  Concentration: Good and Attention Span: Good  Recall:  Good  Fund of Knowledge: Good  Language: Good  Akathisia:  No  Handed:  Right  AIMS (if indicated): not done  Assets:  Communication Skills Desire for Improvement Financial Resources/Insurance Housing Physical Health Social Support  ADL's:  Intact  Cognition: WNL  Sleep:  Good   Screenings: AUDIT    Advertising copywriter from 07/17/2021 in [facility]  Alcohol Use Disorder Identification Test Final Score (AUDIT) 9      GAD-7    Flowsheet Row Video Visit from 12/15/2021 in [facility] Counselor from 12/06/2021 in [facility] Counselor from 09/25/2021 in [facility] Counselor from 07/17/2021 in [facility] Routine Prenatal from 12/19/2016 in [facility]  Total GAD-7 Score PHQ2-9    Flowsheet Row Video Visit from 12/15/2021 in [facility] Counselor from 12/06/2021 in [facility] Counselor from 09/25/2021 in [facility] Counselor from 07/17/2021 in [facility] ED from 06/18/2021 in [facility]  PHQ-2 Total Score 0 0 PHQ-9 Total Score Flowsheet Row ED from 09/15/2021 in [facility] EMERGENCY DEPARTMENT Counselor from 07/17/2021 in [facility] ED from 06/18/2021 in [facility]  C-SSRS RISK CATEGORY No Risk No Risk Error: Q3, 4, or 5 should not be populated when Q2 is No        Assessment and  Plan: Patient reports that she is doing well on her current medication regimen.  No medication changes made today.  Patient agreeable to taking medication as prescribed.  1. Depression affecting pregnancy, antepartum  Continue- busPIRone (BUSPAR) 15 MG tablet; Take 1 tablet (15 mg total) by mouth 2 (two) times daily.  Dispense: 60 tablet; Refill: 3 Continue- sertraline (ZOLOFT) 100 MG tablet; Take 1 tablet (100 mg total) by mouth daily.  Dispense: 30 tablet; Refill: 3  2. GAD (generalized anxiety disorder)  Continue- busPIRone (BUSPAR) 15 MG tablet; Take 1 tablet (15 mg total) by mouth 2 (two) times daily.  Dispense: 60 tablet; Refill: 3 Continue- hydrOXYzine (ATARAX) 25 MG tablet; Take 1 tablet (25 mg total) by mouth at bedtime as needed for anxiety (sleep).  Dispense: 30 tablet; Refill: 3 Continue- sertraline (ZOLOFT) 100 MG tablet; Take 1 tablet (100 mg total) by mouth daily.  Dispense: 30 tablet; Refill: 3   Collaboration of Care: Collaboration of Care: Other provider involved in patient's care AEB counselor  Patient/Guardian was advised Release of Information must be obtained prior to any record release in order to collaborate their care with an outside provider. Patient/Guardian was advised if they have not already done so to contact the registration department to sign all necessary forms in order for [place] to release information regarding their care.   Consent: Patient/Guardian gives verbal consent for treatment and assignment of benefits for services provided during this visit. Patient/Guardian expressed understanding and agreed to proceed.   Follow-up in 3 months Follow-up with therapy  Shanna Cisco, NP 12/15/2021, 10:40 AM

## 2022-01-24 ENCOUNTER — Ambulatory Visit (INDEPENDENT_AMBULATORY_CARE_PROVIDER_SITE_OTHER): Payer: Medicaid Other | Admitting: Licensed Clinical Social Worker

## 2022-01-24 DIAGNOSIS — F331 Major depressive disorder, recurrent, moderate: Secondary | ICD-10-CM | POA: Diagnosis not present

## 2022-01-24 DIAGNOSIS — F411 Generalized anxiety disorder: Secondary | ICD-10-CM

## 2022-01-24 NOTE — Progress Notes (Signed)
   THERAPIST PROGRESS NOTE  Session Time: 64  Participation Level: Active  Behavioral Response: CasualAlertAnxious and Depressed  Type of Therapy: Individual Therapy  Treatment Goals addressed: [name] WILL ATTEND AT LEAST 80% OF SCHEDULED GROUP PSYCHOTHERAPY  SESSIONS  ProgressTowards Goals: Progressing  Interventions: CBT, Motivational Interviewing, and Supportive  Summary: Julia Santiago is a 31 y.o. female who presents with depressed and anxious mood\affect.  Patient was pleasant, cooperative, maintained good eye contact.  She engaged well in therapy session was dressed casually.  Patient was alert and oriented x5.  Patient comes in today as primary stressor as relationship, financials, work, and family conflict.  Patient reports that she is also started to regress in her sobriety of drinking alcohol.  Patient reports primary stressor is her relationship.  Patient reports being in an on-and-off relationship for the past several years with her current significant other.  Patient reports that things have not been going well.  Patient reports when things started to go well and she feels like she has a support system all of a sudden may have a "fight".  And that support system is taken away.  Patient reports this has been difficult for employment as she depends on him for childcare.  Patient reports that he also says hurtful things such as questioning her care for her children.  Suicidal/Homicidal: Nowithout intent/plan  Therapist Response:    Intervention/Plan: LCSW utilized motivational interviewing for positive affirmations, reflective listening, and open-ended questions.  LCSW supportive language for praise and encouragement.  LCSW educated patient on stages of change for "precontemplation, contemplation, maintenance, and action stages".  LCSW used unconditional positive regard to use nonjudgmental stance in session.  LCSW used person centered therapy for empowerment  Plan: Return  again in 3 weeks.  Diagnosis: Major depressive disorder, recurrent episode, moderate (HCC)  GAD (generalized anxiety disorder)  Collaboration of Care: Other Referral to ADS in [facility]      Patient/Guardian was advised Release of Information must be obtained prior to any record release in order to collaborate their care with an outside provider. Patient/Guardian was advised if they have not already done so to contact the registration department to sign all necessary forms in order for [place] to release information regarding their care.   Consent: Patient/Guardian gives verbal consent for treatment and assignment of benefits for services provided during this visit. Patient/Guardian expressed understanding and agreed to proceed.   Weber Cooks, LCSW 01/24/2022

## 2022-02-20 ENCOUNTER — Ambulatory Visit (INDEPENDENT_AMBULATORY_CARE_PROVIDER_SITE_OTHER): Payer: Medicaid Other | Admitting: Licensed Clinical Social Worker

## 2022-02-20 DIAGNOSIS — F331 Major depressive disorder, recurrent, moderate: Secondary | ICD-10-CM | POA: Diagnosis not present

## 2022-02-20 DIAGNOSIS — F411 Generalized anxiety disorder: Secondary | ICD-10-CM

## 2022-02-20 NOTE — Progress Notes (Signed)
   THERAPIST PROGRESS NOTE  Session Time: 27  Participation Level: Active  Behavioral Response: CasualAlertEuthymic  Type of Therapy: Individual Therapy  Treatment Goals addressed: Decreased PHQ-9 below a 10.  Decrease GAD-7 below a 5.  ProgressTowards Goals: Not Progressing  Interventions: CBT, Motivational Interviewing, and Supportive  Summary: Julia Santiago is a 31 y.o. female who presents with euthymic mood\affect.  Patient was pleasant, cooperative, maintained good eye contact.  She engaged well in therapy session and was dressed casually.  Julia Santiago was alert and oriented x5.  Patient reports stressors as ex relationship, sobriety, work, and work life balance.  Patient reports that she has broken up with her significant other and they are moving forward separately.  Patient reports that they are going to coparent together and her ex significant other will get their daughter every other weekend.  Patient reports that she has been attending weekly AA meetings and has remained sober from alcohol intake.  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  Patient endorses coping skills for journaling and meditation weekly.  Patient reports starting a new job at [employer].  Patient reports stress due to falling behind financially several months ago when she went to jail.  Patient reports finding a struggle with worklife balance and being able to prioritize herself as well as her children.  Suicidal/Homicidal: Nowithout intent/plan  Therapist Response:    Intervention/Plan: LCSW educated patient on prioritization.  Prioritizing self and children when needed.  Patient reports coping skills as stated above for journaling and meditation.  LCSW utilized supportive therapy for praise and encouragement.  LCSW used person centered therapy for empowerment.  LCSW administered a GAD-7.  LCSW administered the PHQ-9.  LCSW notes an increase  in both PHQ-9 and GAD-7 scores in the last 60 days.  Patient to continue abstaining from drinking alcohol, attending weekly AA meetings, and utilizing coping skills for journaling and meditation.    Plan: Return again in 3 weeks.  Diagnosis: Major depressive disorder, recurrent episode, moderate (HCC)  GAD (generalized anxiety disorder)  Collaboration of Care: Other none today   Patient/Guardian was advised Release of Information must be obtained prior to any record release in order to collaborate their care with an outside provider. Patient/Guardian was advised if they have not already done so to contact the registration department to sign all necessary forms in order for [place] to release information regarding their care.   Consent: Patient/Guardian gives verbal consent for treatment and assignment of benefits for services provided during this visit. Patient/Guardian expressed understanding and agreed to proceed.   Weber Cooks, LCSW 02/20/2022

## 2022-02-26 ENCOUNTER — Ambulatory Visit (HOSPITAL_COMMUNITY): Payer: Self-pay | Admitting: Licensed Clinical Social Worker

## 2022-03-13 ENCOUNTER — Ambulatory Visit (INDEPENDENT_AMBULATORY_CARE_PROVIDER_SITE_OTHER): Payer: Medicaid Other | Admitting: Licensed Clinical Social Worker

## 2022-03-13 DIAGNOSIS — F331 Major depressive disorder, recurrent, moderate: Secondary | ICD-10-CM

## 2022-03-13 DIAGNOSIS — F411 Generalized anxiety disorder: Secondary | ICD-10-CM

## 2022-03-13 NOTE — Plan of Care (Signed)
Goals treatment updated
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
## 2022-03-28 ENCOUNTER — Ambulatory Visit (INDEPENDENT_AMBULATORY_CARE_PROVIDER_SITE_OTHER): Payer: Medicaid Other | Admitting: Licensed Clinical Social Worker

## 2022-03-28 DIAGNOSIS — F331 Major depressive disorder, recurrent, moderate: Secondary | ICD-10-CM

## 2022-03-28 DIAGNOSIS — F411 Generalized anxiety disorder: Secondary | ICD-10-CM
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
## 2022-05-02 ENCOUNTER — Ambulatory Visit (INDEPENDENT_AMBULATORY_CARE_PROVIDER_SITE_OTHER): Payer: Medicaid Other | Admitting: Licensed Clinical Social Worker

## 2022-05-02 DIAGNOSIS — F411 Generalized anxiety disorder: Secondary | ICD-10-CM

## 2022-05-02 DIAGNOSIS — F331 Major depressive disorder, recurrent, moderate: Secondary | ICD-10-CM

## 2022-05-02 NOTE — Progress Notes (Signed)
   THERAPIST PROGRESS NOTE  Virtual Visit via Video Note  I connected with Caliope D Politte on 05/02/22 at  9:00 AM EDT by a video enabled telemedicine application and verified that I am speaking with the correct person using two identifiers.  Location: Patient: [facility]  Provider: Providers Home    I discussed the limitations of evaluation and management by telemedicine and the availability of in person appointments. The patient expressed understanding and agreed to proceed.      I discussed the assessment and treatment plan with the patient. The patient was provided an opportunity to ask questions and all were answered. The patient agreed with the plan and demonstrated an understanding of the instructions.   The patient was advised to call back or seek an in-person evaluation if the symptoms worsen or if the condition fails to improve as anticipated.  I provided 30 minutes of non-face-to-face time during this encounter.   Dory Horn, LCSW   Participation Level: Active  Behavioral Response: CasualAlertAnxious and Depressed  Type of Therapy: Individual Therapy  Treatment Goals addressed: patient will attend at least 80% of scheduled family  sessions  ProgressTowards Goals: Progressing  Interventions: CBT and Motivational Interviewing    Suicidal/Homicidal: Nowithout intent/plan  Therapist Response:    Pt was alert and oriented x 5. She was dressed casually and engaged well in therapy session. Pt was pleasant, cooperative and maintained good eye contact. She presented with depressed and anxious mood/affect.   Pt reports primary stressors are family conflict, ex relationship, and financials.  Pt reports she has been missing work due to her child's behavior. Pt states he gets kicked out of class weekly. She reports this causes her to miss work, which cost money and put her further behind with her bills.   Interventions: LCSW used solution focus therapy  providing pt resources to [facility] for family therapy. LCSW used supportive therapy for praise and encouragement. LCSW used open ended questions, positive affirmations, and reflective listening for motivational interviewing.   Other stressor is ex relationship. Pt reports that she is has now blocked her ex. She reports that it is not fair that he gets to walk away from everything, and she must handle to the responsibilities of the kids.    Intervention: LCSW encouragement pt to focus on the here and now. LCSW educated pt on the stages of grief and how they can be applied to ex relationships and not just death. LCSW used psychoanalytic therapy for pt to express thoughts, feeling, and emotions. Plan for pt is to decrease alcohol intake down to 0/7 days per week. Pt to utilize 1 coping skill provided for pt from work sheet called "coping skills for depression" and pt to follow up with [facility].    Plan: Return again in 3 weeks.  Diagnosis: Major depressive disorder, recurrent episode, moderate (HCC)  GAD (generalized anxiety disorder)  Collaboration of Care: Other None today   Patient/Guardian was advised Release of Information must be obtained prior to any record release in order to collaborate their care with an outside provider. Patient/Guardian was advised if they have not already done so to contact the registration department to sign all necessary forms in order for [place] to release information regarding their care.   Consent: Patient/Guardian gives verbal consent for treatment and assignment of benefits for services provided during this visit. Patient/Guardian expressed understanding and agreed to proceed.   Dory Horn, LCSW 05/02/2022

## 2022-05-23 ENCOUNTER — Encounter (HOSPITAL_COMMUNITY): Payer: Self-pay

## 2022-05-23 ENCOUNTER — Ambulatory Visit (HOSPITAL_COMMUNITY): Payer: Medicaid Other | Admitting: Licensed Clinical Social Worker

## 2022-06-06 ENCOUNTER — Encounter (HOSPITAL_COMMUNITY): Payer: Self-pay | Admitting: Psychiatry

## 2022-06-06 ENCOUNTER — Telehealth (INDEPENDENT_AMBULATORY_CARE_PROVIDER_SITE_OTHER): Payer: Medicaid Other | Admitting: Psychiatry

## 2022-06-06 ENCOUNTER — Ambulatory Visit (INDEPENDENT_AMBULATORY_CARE_PROVIDER_SITE_OTHER): Payer: Medicaid Other | Admitting: Licensed Clinical Social Worker

## 2022-06-06 DIAGNOSIS — F32A Depression, unspecified: Secondary | ICD-10-CM

## 2022-06-06 DIAGNOSIS — F331 Major depressive disorder, recurrent, moderate: Secondary | ICD-10-CM

## 2022-06-06 DIAGNOSIS — F411 Generalized anxiety disorder: Secondary | ICD-10-CM

## 2022-06-06 MED ORDER — BUSPIRONE HCL 15 MG PO TABS: 15.0000 mg | ORAL_TABLET | Freq: Two times a day (BID) | ORAL | 3 refills | Status: DC

## 2022-06-06 MED ORDER — HYDROXYZINE HCL 25 MG PO TABS: 25.0000 mg | ORAL_TABLET | Freq: Every evening | ORAL | 3 refills | Status: DC | PRN

## 2022-06-06 MED ORDER — SERTRALINE HCL 50 MG PO TABS: 150.0000 mg | ORAL_TABLET | Freq: Every day | ORAL | 3 refills | Status: DC

## 2022-06-06 NOTE — Progress Notes (Signed)
   THERAPIST PROGRESS NOTE  Session Time: 25   Participation Level: Active  Behavioral Response: CasualAlertAnxious and Depressed  Type of Therapy: Individual Therapy  Treatment Goals addressed: STG: Julia Santiago WILL COMPLETE AT LEAST 80% OF ASSIGNED HOMEWORK   ProgressTowards Goals: Progressing  Interventions: CBT, Motivational Interviewing, and Supportive  Summary: Julia Santiago is a 31 y.o. female who presents with depressed and anxious mood\affect.  Patient was pleasant, cooperative, maintained good eye contact.  She engaged well in therapy session was dressed casually.  Julia Santiago was alert and oriented x 5.  Primary stressors for patient are financials, family conflict, relationship, and work.  Patient states that overall work and financials have been going better but they are not where they need to be as of yet with the holiday season right around the corner.  Patient reports that she has been calling out of work last due to her son's behavior.  Julia Santiago reports that she has been getting involved with her son and getting him an IEP at school which has helped immensely getting him extra time in special classes.  This has improved her call off time at work which helps with her financial situation.  Other stressors for patient are her relationship and gaining closure.  Patient reports that she has "slept" with her ex 1 time in the last 30 days.  Patient reports that she is still trying to find a balance of independence while separated from her ex significant other.   Suicidal/Homicidal: Nowithout intent/plan  Therapist Response:      Intervention/Plan: LCSW psychoanalytic therapy for patient to express thoughts, feelings and emotions.  LCSW supportive therapy for praise and encouragement.  LCSW used person centered therapy for empowerment and unconditional positive regard using nonjudgmental stance.  LCSW educated patient on boundary setting and "tips for setting healthy boundaries".   Plan for patient is to continue to set boundaries with her ex significant other.  Take medications as prescribed.  And decrease GAD-7 and PHQ-9 below goals stated in treatment plan.    Plan: Return again in 4 weeks.  Diagnosis: No diagnosis found.  Collaboration of Care: Other None today   Patient/Guardian was advised Release of Information must be obtained prior to any record release in order to collaborate their care with an outside provider. Patient/Guardian was advised if they have not already done so to contact the registration department to sign all necessary forms in order for [place] to release information regarding their care.   Consent: Patient/Guardian gives verbal consent for treatment and assignment of benefits for services provided during this visit. Patient/Guardian expressed understanding and agreed to proceed.   Weber Cooks, LCSW 06/06/2022

## 2022-06-06 NOTE — Progress Notes (Signed)
BH MD/PA/NP OP Progress Note Virtual Visit via Video Note  I connected with Julia Santiago on 06/06/22 at 10:30 AM EST by a video enabled telemedicine application and verified that I am speaking with the correct person using two identifiers.  Location: Patient: Home Provider: Clinic   I discussed the limitations of evaluation and management by telemedicine and the availability of in person appointments. The patient expressed understanding and agreed to proceed.  I provided 30 minutes of non-face-to-face time during this encounter.   06/06/2022 11:22 AM Julia Santiago  MRN:  [id number]  Chief Complaint: "I would like Zoloft increased"  HPI: 31 year old female seen today for follow-up psychiatric evaluation.  She has a psychiatric history of anxiety and depression.  She is currently managed on BuSpar 15 milligrams twice daily, hydroxyzine 25 mg nightly, and Zoloft 100 mg daily.  She notes her medications are somewhat effective managing her psychiatric condition.  Today she is well-groomed, pleasant, cooperative, engaged in conversation, maintained eye contact.  She informed Clinical research associatewriter that she would like her Zoloft increased because she is experiencing increased anxiety and depression.  Patient notes that she is anxious about her children.  She notes that one of her children has mental health issues and or is having behavioral outburst in school.  She notes that she recently got him signed up for special education classes at school and notes that things are getting better.  Patient also notes that she is also worried about finances and reports that she has had to take several days off from work to deal with her sons behavior.  She informed Clinical research associatewriter that she is behind on bills and is nervous about the holidays.  Today provider conducted a GAD-7 and patient scored an 11, at her last visit she scored a 3.  Provider also conducted PHQ-9 and patient scored a 10, at her last visit she scored a 8.  She  endorses fluctuations in sleep and appetite.  Today she denies SI/HI/VAH, mania, or paranoia.  To cope with above patient notes that she has been relying on alcohol.  She notes that she drinks alcohol every few days.  Provider conducted an audit assessment and patient scored a 13.  Provider emailed patient a list of facilities to help with alcohol dependence.  Patient also has a counselor who she sees regularly.  Today patient agreeable to increasing Zoloft 100 mg to 150 mg to help manage anxiety and depression.  She will continue her other medications as prescribed and follow-up with outpatient counseling for therapy.  No other concerns at this time. Visit Diagnosis:    ICD-10-CM   1. GAD (generalized anxiety disorder)  F41.1 busPIRone (BUSPAR) 15 MG tablet    hydrOXYzine (ATARAX) 25 MG tablet    sertraline (ZOLOFT) 50 MG tablet    2. Mild depression  F32.A busPIRone (BUSPAR) 15 MG tablet    sertraline (ZOLOFT) 50 MG tablet      Past Psychiatric History: Anxiety and depression  Past Medical History:  Past Medical History:  Diagnosis Date   Anxiety    Depression    was on certaline for pp depression   Gestational diabetes    Headache     Past Surgical History:  Procedure Laterality Date   CESAREAN SECTION N/A 01/29/2015   Procedure: CESAREAN SECTION;  Surgeon: Brock Badharles A Harper, MD;  Location: [facility];  Service: Obstetrics;  Laterality: N/A;   CESAREAN SECTION N/A 12/21/2016   Procedure: REPEAT CESAREAN SECTION;  Surgeon: Catalina Antiguaonstant, Peggy, MD;  Location: [facility];  Service: Obstetrics;  Laterality: N/A;   CESAREAN SECTION WITH BILATERAL TUBAL LIGATION Bilateral 11/02/2020   Procedure: CESAREAN SECTION WITH BILATERAL TUBAL LIGATION;  Surgeon: Federico Flake, MD;  Location: [facility];  Service: Obstetrics;  Laterality: Bilateral;    Family Psychiatric History: Mother anxiety and depression, maternal aunt bipolar disorder, maternal grandmother unknown psychiatric  conditions  Family History:  Family History  Problem Relation Age of Onset   Anxiety disorder Mother    Depression Mother    Heart disease Father    Stroke Maternal Grandmother    Arthritis Maternal Grandmother    Heart disease Maternal Grandfather    Alcohol abuse Maternal Grandfather    Cancer Paternal Grandmother     Social History:  Social History   Socioeconomic History   Marital status: Single    Spouse name: Not on file   Number of children: 2   Years of education: Not on file   Highest education level: Not on file  Occupational History   Occupation: UNEMPLOYED  Tobacco Use   Smoking status: Former    Packs/day: 0.50    Types: Cigarettes    Quit date: 07/2020    Years since quitting: 1.9   Smokeless tobacco: Never  Vaping Use   Vaping Use: Never used  Substance and Sexual Activity   Alcohol use: Yes    Comment: weekends   Drug use: No   Sexual activity: Not Currently    Birth control/protection: None  Other Topics Concern   Not on file  Social History Narrative   Not on file   Social Determinants of Health   Financial Resource Strain: High Risk (07/17/2021)   Overall Financial Resource Strain (CARDIA)    Difficulty of Paying Living Expenses: Hard  Food Insecurity: Food Insecurity Present (07/17/2021)   Hunger Vital Sign    Worried About Running Out of Food in the Last Year: Sometimes true    Ran Out of Food in the Last Year: Sometimes true  Transportation Needs: No Transportation Needs (07/17/2021)   PRAPARE - Administrator, Civil Service (Medical): No    Lack of Transportation (Non-Medical): No  Physical Activity: Inactive (07/17/2021)   Exercise Vital Sign    Days of Exercise per Week: 0 days    Minutes of Exercise per Session: 0 min  Stress: Stress Concern Present (07/17/2021)   [employer] of Occupational Health - Occupational Stress Questionnaire    Feeling of Stress : Rather much  Social Connections: Socially Isolated (07/17/2021)    Social Connection and Isolation Panel [NHANES]    Frequency of Communication with Friends and Family: More than three times a week    Frequency of Social Gatherings with Friends and Family: Never    Attends Religious Services: Never    Database administrator or Organizations: No    Attends Banker Meetings: Never    Marital Status: Never married    Allergies:  Allergies  Allergen Reactions   Penicillins     Has patient had a PCN reaction causing immediate rash, facial/tongue/throat swelling, SOB or lightheadedness with hypotension: No Has patient had a PCN reaction causing severe rash involving mucus membranes or skin necrosis: No Has patient had a PCN reaction that required hospitalization: No Has patient had a PCN reaction occurring within the last 10 years: No If all of the above answers are "NO", then may proceed with Cephalosporin use.  As a child-reaction unknown  Metabolic Disorder Labs: Lab Results  Component Value Date   HGBA1C 5.6 04/26/2020   No results found for: "PROLACTIN" No results found for: "CHOL", "TRIG", "HDL", "CHOLHDL", "VLDL", "LDLCALC" No results found for: "TSH"  Therapeutic Level Labs: No results found for: "LITHIUM" No results found for: "VALPROATE" No results found for: "CBMZ"  Current Medications: Current Outpatient Medications  Medication Sig Dispense Refill   busPIRone (BUSPAR) 15 MG tablet Take 1 tablet (15 mg total) by mouth 2 (two) times daily. 60 tablet 3   hydrOXYzine (ATARAX) 25 MG tablet Take 1 tablet (25 mg total) by mouth at bedtime as needed for anxiety (sleep). 30 tablet 3   predniSONE (DELTASONE) 5 MG tablet Take 5 mg by mouth 4 (four) times daily - after meals and at bedtime.     Prenatal Vit-Fe Fumarate-FA (PRENATAL VITAMINS) 28-0.8 MG TABS Take 1 tablet by mouth daily. 60 tablet 3   sertraline (ZOLOFT) 50 MG tablet Take 3 tablets (150 mg total) by mouth daily. 90 tablet 3   No current facility-administered  medications for this visit.     Musculoskeletal: Strength & Muscle Tone: within normal limits and telehealth visit Gait & Station: normal, telehealth visit Patient leans: N/A  Psychiatric Specialty Exam: Review of Systems  unknown if currently breastfeeding.There is no height or weight on file to calculate BMI.  General Appearance: Well Groomed  Eye Contact:  Good  Speech:  Clear and Coherent and Normal Rate  Volume:  Normal  Mood:  Euthymic  Affect:  Appropriate and Congruent  Thought Process:  Coherent, Goal Directed, and Linear  Orientation:  Full (Time, Place, and Person)  Thought Content: WDL and Logical   Suicidal Thoughts:  No  Homicidal Thoughts:  No  Memory:  Immediate;   Good Recent;   Good Remote;   Good  Judgement:  Good  Insight:  Good  Psychomotor Activity:  Normal  Concentration:  Concentration: Good and Attention Span: Good  Recall:  Good  Fund of Knowledge: Good  Language: Good  Akathisia:  No  Handed:  Right  AIMS (if indicated): not done  Assets:  Communication Skills Desire for Improvement Financial Resources/Insurance Housing Physical Health Social Support  ADL's:  Intact  Cognition: WNL  Sleep:  Good   Screenings: AUDIT    Flowsheet Row Video Visit from 06/06/2022 in [facility] Counselor from 07/17/2021 in [facility]  Alcohol Use Disorder Identification Test Final Score (AUDIT) 13 9      GAD-7    Flowsheet Row Video Visit from 06/06/2022 in [facility] Counselor from 02/20/2022 in [facility] Video Visit from 12/15/2021 in [facility] Counselor from 12/06/2021 in [facility] Counselor from 09/25/2021 in [facility]  Total GAD-7 Score 11 12 3 6 6       PHQ2-9    Flowsheet Row Video Visit from 06/06/2022 in [facility] Counselor from 02/20/2022 in [facility] Video Visit from 12/15/2021 in [facility] Counselor from 12/06/2021 in [facility] Counselor from 09/25/2021 in [facility]  PHQ-2 Total Score 2 2 0 0 2  PHQ-9 Total Score 10 10 8 9 7       Flowsheet Row ED from 09/15/2021 in [facility] Counselor from 07/17/2021 in [facility] ED from 06/18/2021 in  [facility]  C-SSRS RISK CATEGORY No Risk No Risk Error: Q3, 4, or 5 should not be populated when Q2 is No        Assessment and Plan: Patient endorses symptoms of anxiety and depression.  Patient also notes that she has been relying on alcohol to help manage these conditions.  Today she is agreeable to increasing Zoloft 100 mg to 150 mg to help manage anxiety and depression.  She will continue all other medications as prescribed.  Patient given a list of substance use treatment facilities to help manage his alcohol dependence.  She will follow-up with outpatient counseling for therapy.  1. GAD (generalized anxiety disorder)  Continue- busPIRone (BUSPAR) 15 MG tablet; Take 1 tablet (15 mg total) by mouth 2 (two) times daily.  Dispense: 60 tablet; Refill: 3 Continue- hydrOXYzine (ATARAX) 25 MG tablet; Take 1 tablet (25 mg total) by mouth at bedtime as needed for anxiety (sleep).  Dispense: 30 tablet; Refill: 3 Increase- sertraline (ZOLOFT) 50 MG tablet; Take 3 tablets (150 mg total) by mouth daily.  Dispense: 90 tablet; Refill: 3  2. Mild depression  Continue- busPIRone (BUSPAR) 15 MG tablet; Take 1 tablet (15 mg total) by mouth 2 (two) times daily.  Dispense: 60 tablet; Refill: 3 Increase- sertraline (ZOLOFT) 50 MG tablet; Take 3 tablets (150 mg total) by mouth daily.  Dispense: 90 tablet; Refill: 3    Follow-up in 3 months Follow-up with  therapy  Shanna Cisco, NP 06/06/2022, 11:22 AM

## 2022-07-11 ENCOUNTER — Ambulatory Visit (HOSPITAL_COMMUNITY): Payer: Medicaid Other | Admitting: Licensed Clinical Social Worker

## 2022-07-31 ENCOUNTER — Ambulatory Visit (INDEPENDENT_AMBULATORY_CARE_PROVIDER_SITE_OTHER): Payer: Medicaid Other | Admitting: Licensed Clinical Social Worker

## 2022-07-31 DIAGNOSIS — F411 Generalized anxiety disorder: Secondary | ICD-10-CM

## 2022-07-31 DIAGNOSIS — F331 Major depressive disorder, recurrent, moderate: Secondary | ICD-10-CM | POA: Diagnosis not present

## 2022-07-31 NOTE — Progress Notes (Signed)
   THERAPIST PROGRESS NOTE  Session Time: 51  Participation Level: Active  Behavioral Response: CasualAlertAnxious and Depressed  Type of Therapy: Individual Therapy  Treatment Goals addressed: [name] WILL COMPLETE AT LEAST 80% OF ASSIGNED HOMEWORK   ProgressTowards Goals: Progressing  Interventions: CBT and Motivational Interviewing  Summary: Julia Santiago is a 32 y.o. female who presents with depressed and anxious mood\affect.  Patient was pleasant, cooperative, maintained good eye contact.  Julia Santiago was alert and oriented x 5.  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  Patient reports that she has been unable to maintain a regular work schedule due to her 64-year-old son having behavioral problems.  LCSW solution-focused therapy today to talk with patient about interventions for outpatient therapy such as [facility] for intensive in-home therapy.  Patient reports that this causes her stress and tension due to the fact of changing her schedule and adding to her "to do list".  LCSW spoke with patient today about finding solutions to the problem even in the short-term of adding anxiety.   Other stressors for patient is her eldest children's father.  Patient reports that he only pays child support but does not take them in his care on a regular basis.  LCSW spoke with patient today about family court options about getting a custody agreement.    Suicidal/Homicidal: Nowithout intent/plan  Therapist Response:    Intervention/Plan: LCSW utilized reframing in today's session.  LCSW spoke with patient about assigned homework for coping skills such as meditation.  Patient reports utilizing meditation at least 3 times weekly.  LCSW also reports decreasing her drinking and reports sobriety since 2023.  Plan for patient is to contact at least 1 mental health service for her son in the next 3 to 4 weeks.  Plan: Return again in 3 weeks.  Diagnosis:  Major depressive disorder, recurrent episode, moderate (HCC)  GAD (generalized anxiety disorder)  Collaboration of Care: Other None today   Patient/Guardian was advised Release of Information must be obtained prior to any record release in order to collaborate their care with an outside provider. Patient/Guardian was advised if they have not already done so to contact the registration department to sign all necessary forms in order for [place] to release information regarding their care.   Consent: Patient/Guardian gives verbal consent for treatment and assignment of benefits for services provided during this visit. Patient/Guardian expressed understanding and agreed to proceed.   Dory Horn, LCSW 07/31/2022

## 2022-08-06 ENCOUNTER — Other Ambulatory Visit (HOSPITAL_COMMUNITY): Payer: Self-pay | Admitting: Psychiatry

## 2022-08-06 DIAGNOSIS — F32A Depression, unspecified: Secondary | ICD-10-CM

## 2022-08-06 DIAGNOSIS — F411 Generalized anxiety disorder: Secondary | ICD-10-CM

## 2022-08-27 ENCOUNTER — Encounter (HOSPITAL_COMMUNITY): Payer: Self-pay | Admitting: Psychiatry

## 2022-08-27 ENCOUNTER — Telehealth (INDEPENDENT_AMBULATORY_CARE_PROVIDER_SITE_OTHER): Payer: Medicaid Other | Admitting: Psychiatry

## 2022-08-27 DIAGNOSIS — F331 Major depressive disorder, recurrent, moderate: Secondary | ICD-10-CM | POA: Diagnosis not present

## 2022-08-27 DIAGNOSIS — Z789 Other specified health status: Secondary | ICD-10-CM

## 2022-08-27 DIAGNOSIS — F411 Generalized anxiety disorder: Secondary | ICD-10-CM | POA: Diagnosis not present

## 2022-08-27 DIAGNOSIS — F902 Attention-deficit hyperactivity disorder, combined type: Secondary | ICD-10-CM | POA: Diagnosis not present

## 2022-08-27 MED ORDER — BUPROPION HCL ER (XL) 150 MG PO TB24: 150.0000 mg | ORAL_TABLET | ORAL | 3 refills | Status: DC

## 2022-08-27 MED ORDER — SERTRALINE HCL 50 MG PO TABS: 50.0000 mg | ORAL_TABLET | Freq: Every day | ORAL | 3 refills | Status: DC

## 2022-08-27 MED ORDER — BUSPIRONE HCL 15 MG PO TABS: 15.0000 mg | ORAL_TABLET | Freq: Two times a day (BID) | ORAL | 3 refills | Status: DC

## 2022-08-27 MED ORDER — SERTRALINE HCL 100 MG PO TABS: 100.0000 mg | ORAL_TABLET | Freq: Every day | ORAL | 3 refills | Status: DC

## 2022-08-27 MED ORDER — HYDROXYZINE HCL 25 MG PO TABS: 25.0000 mg | ORAL_TABLET | Freq: Every evening | ORAL | 3 refills | Status: DC | PRN

## 2022-08-27 NOTE — Progress Notes (Signed)
BH MD/PA/NP OP Progress Note Virtual Visit via Video Note  I connected with Julia Santiago on 08/27/22 at  9:30 AM EST by a video enabled telemedicine application and verified that I am speaking with the correct person using two identifiers.  Location: Patient: Home Provider: Clinic   I discussed the limitations of evaluation and management by telemedicine and the availability of in person appointments. The patient expressed understanding and agreed to proceed.  I provided 30 minutes of non-face-to-face time during this encounter.   08/27/2022 10:06 AM Laelani SKARLETT KLUG  MRN:  [id number]  Chief Complaint: "I am doing better" 30-31  HPI: 32 year old female seen today for follow-up psychiatric evaluation.  She has a psychiatric history of anxiety and depression.  She is currently managed on BuSpar 15 milligrams twice daily, hydroxyzine 25 mg nightly, and Zoloft 150 mg daily.  She notes her medications are somewhat effective managing her psychiatric condition.  Today she is well-groomed, pleasant, cooperative, engaged in conversation, maintained eye contact.  She informed Probation officer that she is doing somewhat better since decreasing Zoloft.  Patient informed Probation officer however that she was told that she may have to switch to Lexapro as her pharmacy did not have the Zoloft.  Patient notes that she prefers to stay on her current regimen as she finds it effective and have seen improvement.  Patient notes that she continues to worry about her son who may have special needs.  She informed Probation officer that he is with suspected from school 6 times.  She endorsed that she is also worried about finances as her hours at [employer] has become back from 30 hours to 15 hours a week.  She informed Probation officer that she is actively pursuing another position.  Patient informed writer to cope that time she drinks alcohol.  She notes that she cut back her consumption but recently bandaged on alcohol.  She informed Probation officer that she did  not receive the email for resources for alcohol use treatment.  Provider informed patient that this form will be very emailed to her.  She endorsed understanding and agreed.   Patient notes that the above exacerbates her anxiety and depression.  Today provider conducted a GAD-7 and patient scored a 12, at her last visit she scored an 11.  Provider also conducted PHQ-9 patient scored a 18, at her last visit she scored a 10.  She endorses sleeping 4 to 6 hours nightly.  Today she denies SI/HI/VAH or mania.  Patient i endorses symptoms of ADHD.  She nformed Probation officer that she is easily distracted, forgetful, disoriented eyes, and inattentive to mentally taxing tasks.  Today patient agreeable to starting Wellbutrin XL 150 mg to help manage symptoms of ADHD and depression.  She will continue all other medications as prescribed and follow-up with outpatient counseling for therapy.  No other concerns at this time.  Visit Diagnosis:    ICD-10-CM   1. Attention deficit hyperactivity disorder (ADHD), combined type  F90.2 buPROPion (WELLBUTRIN XL) 150 MG 24 hr tablet    2. GAD (generalized anxiety disorder)  F41.1 busPIRone (BUSPAR) 15 MG tablet    hydrOXYzine (ATARAX) 25 MG tablet    sertraline (ZOLOFT) 100 MG tablet    sertraline (ZOLOFT) 50 MG tablet    3. Major depressive disorder, recurrent episode, moderate (HCC)  F33.1 buPROPion (WELLBUTRIN XL) 150 MG 24 hr tablet    busPIRone (BUSPAR) 15 MG tablet    sertraline (ZOLOFT) 100 MG tablet    sertraline (ZOLOFT) 50 MG tablet  4. Alcohol use  Z78.9       Past Psychiatric History: Anxiety and depression  Past Medical History:  Past Medical History:  Diagnosis Date   Anxiety    Depression    was on certaline for pp depression   Gestational diabetes    Headache     Past Surgical History:  Procedure Laterality Date   CESAREAN SECTION N/A 01/29/2015   Procedure: CESAREAN SECTION;  Surgeon: Shelly Bombard, MD;  Location: [facility];  Service:  Obstetrics;  Laterality: N/A;   CESAREAN SECTION N/A 12/21/2016   Procedure: REPEAT CESAREAN SECTION;  Surgeon: Mora Bellman, MD;  Location: [facility];  Service: Obstetrics;  Laterality: N/A;   CESAREAN SECTION WITH BILATERAL TUBAL LIGATION Bilateral 11/02/2020   Procedure: CESAREAN SECTION WITH BILATERAL TUBAL LIGATION;  Surgeon: Caren Macadam, MD;  Location: [facility];  Service: Obstetrics;  Laterality: Bilateral;    Family Psychiatric History: Mother anxiety and depression, maternal aunt bipolar disorder, maternal grandmother unknown psychiatric conditions  Family History:  Family History  Problem Relation Age of Onset   Anxiety disorder Mother    Depression Mother    Heart disease Father    Stroke Maternal Grandmother    Arthritis Maternal Grandmother    Heart disease Maternal Grandfather    Alcohol abuse Maternal Grandfather    Cancer Paternal Grandmother     Social History:  Social History   Socioeconomic History   Marital status: Single    Spouse name: Not on file   Number of children: 2   Years of education: Not on file   Highest education level: Not on file  Occupational History   Occupation: UNEMPLOYED  Tobacco Use   Smoking status: Former    Packs/day: 0.50    Types: Cigarettes    Quit date: 07/2020    Years since quitting: 2.1   Smokeless tobacco: Never  Vaping Use   Vaping Use: Never used  Substance and Sexual Activity   Alcohol use: Yes    Comment: weekends   Drug use: No   Sexual activity: Not Currently    Birth control/protection: None  Other Topics Concern   Not on file  Social History Narrative   Not on file   Social Determinants of Health   Financial Resource Strain: High Risk (07/17/2021)   Overall Financial Resource Strain (CARDIA)    Difficulty of Paying Living Expenses: Hard  Food Insecurity: Food Insecurity Present (07/17/2021)   Hunger Vital Sign    Worried About Running Out of Food in the Last Year: Sometimes true     Ran Out of Food in the Last Year: Sometimes true  Transportation Needs: No Transportation Needs (07/17/2021)   PRAPARE - Hydrologist (Medical): No    Lack of Transportation (Non-Medical): No  Physical Activity: Inactive (07/17/2021)   Exercise Vital Sign    Days of Exercise per Week: 0 days    Minutes of Exercise per Session: 0 min  Stress: Stress Concern Present (07/17/2021)   [place]    Feeling of Stress : Rather much  Social Connections: Socially Isolated (07/17/2021)   Social Connection and Isolation Panel [NHANES]    Frequency of Communication with Friends and Family: More than three times a week    Frequency of Social Gatherings with Friends and Family: Never    Attends Religious Services: Never    Marine scientist or Organizations: No  Attends Archivist Meetings: Never    Marital Status: Never married    Allergies:  Allergies  Allergen Reactions   Penicillins     Has patient had a PCN reaction causing immediate rash, facial/tongue/throat swelling, SOB or lightheadedness with hypotension: No Has patient had a PCN reaction causing severe rash involving mucus membranes or skin necrosis: No Has patient had a PCN reaction that required hospitalization: No Has patient had a PCN reaction occurring within the last 10 years: No If all of the above answers are "NO", then may proceed with Cephalosporin use.  As a child-reaction unknown    Metabolic Disorder Labs: Lab Results  Component Value Date   HGBA1C 5.6 04/26/2020   No results found for: "PROLACTIN" No results found for: "CHOL", "TRIG", "HDL", "CHOLHDL", "VLDL", "LDLCALC" No results found for: "TSH"  Therapeutic Level Labs: No results found for: "LITHIUM" No results found for: "VALPROATE" No results found for: "CBMZ"  Current Medications: Current Outpatient Medications  Medication Sig Dispense Refill    buPROPion (WELLBUTRIN XL) 150 MG 24 hr tablet Take 1 tablet (150 mg total) by mouth every morning. 30 tablet 3   sertraline (ZOLOFT) 100 MG tablet Take 1 tablet (100 mg total) by mouth daily. 30 tablet 3   sertraline (ZOLOFT) 50 MG tablet Take 1 tablet (50 mg total) by mouth daily. 30 tablet 3   busPIRone (BUSPAR) 15 MG tablet Take 1 tablet (15 mg total) by mouth 2 (two) times daily. 60 tablet 3   hydrOXYzine (ATARAX) 25 MG tablet Take 1 tablet (25 mg total) by mouth at bedtime as needed for anxiety (sleep). 30 tablet 3   predniSONE (DELTASONE) 5 MG tablet Take 5 mg by mouth 4 (four) times daily - after meals and at bedtime.     Prenatal Vit-Fe Fumarate-FA (PRENATAL VITAMINS) 28-0.8 MG TABS Take 1 tablet by mouth daily. 60 tablet 3   No current facility-administered medications for this visit.     Musculoskeletal: Strength & Muscle Tone: within normal limits and telehealth visit Gait & Station: normal, telehealth visit Patient leans: N/A  Psychiatric Specialty Exam: Review of Systems  unknown if currently breastfeeding.There is no height or weight on file to calculate BMI.  General Appearance: Well Groomed  Eye Contact:  Good  Speech:  Clear and Coherent and Normal Rate  Volume:  Normal  Mood:  Anxious and Depressed  Affect:  Appropriate and Congruent  Thought Process:  Coherent, Goal Directed, and Linear  Orientation:  Full (Time, Place, and Person)  Thought Content: WDL and Logical   Suicidal Thoughts:  No  Homicidal Thoughts:  No  Memory:  Immediate;   Good Recent;   Good Remote;   Good  Judgement:  Good  Insight:  Good  Psychomotor Activity:  Normal  Concentration:  Concentration: Good and Attention Span: Good  Recall:  Good  Fund of Knowledge: Good  Language: Good  Akathisia:  No  Handed:  Right  AIMS (if indicated): not done  Assets:  Communication Skills Desire for Improvement Financial Resources/Insurance Housing Physical Health Social Support  ADL's:  Intact   Cognition: WNL  Sleep:  Fair   Screenings: AUDIT    Flowsheet Row Video Visit from 06/06/2022 in [facility] Counselor from 07/17/2021 in [facility]  Alcohol Use Disorder Identification Test Final Score (AUDIT) 13 9      GAD-7    Flowsheet Row Video Visit from 08/27/2022 in [facility] Video  Visit from 06/06/2022 in [facility] Counselor from 02/20/2022 in [facility] Video Visit from 12/15/2021 in [facility] Counselor from 12/06/2021 in [facility]  Total GAD-7 Score 12 11 12 3 6      $ PHQ2-9    Flowsheet Row Video Visit from 08/27/2022 in [facility] Video Visit from 06/06/2022 in [facility] Counselor from 02/20/2022 in [facility] Video Visit from 12/15/2021 in [facility] Counselor from 12/06/2021 in [facility]  PHQ-2 Total Score 3 2 2 $ 0 0  PHQ-9 Total Score 18 10 10 8 9      $ Flowsheet Row ED from 09/15/2021 in [facility] Counselor from 07/17/2021 in [facility] ED from 06/18/2021 in [facility] No Risk No Risk Error: Q3, 4, or 5 should not be populated when Q2 is No        Assessment and Plan: Patient endorses symptoms of ADHD, alcohol use, anxiety and depression.  Today patient agreeable to starting Wellbutrin XL 150 mg to help manage symptoms of ADHD and depression.  She will continue all other medications as prescribed    1. GAD (generalized anxiety disorder)  Continue- busPIRone (BUSPAR) 15 MG tablet; Take 1 tablet (15 mg total) by mouth 2 (two) times daily.  Dispense: 60 tablet; Refill: 3 Continue- hydrOXYzine  (ATARAX) 25 MG tablet; Take 1 tablet (25 mg total) by mouth at bedtime as needed for anxiety (sleep).  Dispense: 30 tablet; Refill: 3 Continue- sertraline (ZOLOFT) 100 MG tablet; Take 1 tablet (100 mg total) by mouth daily.  Dispense: 30 tablet; Refill: 3 Continue- sertraline (ZOLOFT) 50 MG tablet; Take 1 tablet (50 mg total) by mouth daily.  Dispense: 30 tablet; Refill: 3  2. Attention deficit hyperactivity disorder (ADHD), combined type  Start- buPROPion (WELLBUTRIN XL) 150 MG 24 hr tablet; Take 1 tablet (150 mg total) by mouth every morning.  Dispense: 30 tablet; Refill: 3  3. Major depressive disorder, recurrent episode, moderate (HCC)  Start- buPROPion (WELLBUTRIN XL) 150 MG 24 hr tablet; Take 1 tablet (150 mg total) by mouth every morning.  Dispense: 30 tablet; Refill: 3 Continue- busPIRone (BUSPAR) 15 MG tablet; Take 1 tablet (15 mg total) by mouth 2 (two) times daily.  Dispense: 60 tablet; Refill: 3 Continue- sertraline (ZOLOFT) 100 MG tablet; Take 1 tablet (100 mg total) by mouth daily.  Dispense: 30 tablet; Refill: 3 Continue with- sertraline (ZOLOFT) 50 MG tablet; Take 1 tablet (50 mg total) by mouth daily.  Dispense: 30 tablet; Refill: 3  4. Alcohol use  Follow-up in 3 months Follow-up with therapy  Salley Slaughter, NP 08/27/2022, 10:06 AM

## 2022-09-05 ENCOUNTER — Ambulatory Visit (HOSPITAL_COMMUNITY): Payer: Medicaid Other | Admitting: Licensed Clinical Social Worker

## 2022-09-23 ENCOUNTER — Encounter (HOSPITAL_BASED_OUTPATIENT_CLINIC_OR_DEPARTMENT_OTHER): Payer: Self-pay

## 2022-09-23 ENCOUNTER — Other Ambulatory Visit: Payer: Self-pay

## 2022-09-23 ENCOUNTER — Emergency Department (HOSPITAL_BASED_OUTPATIENT_CLINIC_OR_DEPARTMENT_OTHER): Payer: Medicaid Other

## 2022-09-23 ENCOUNTER — Emergency Department (HOSPITAL_BASED_OUTPATIENT_CLINIC_OR_DEPARTMENT_OTHER)
Admission: EM | Admit: 2022-09-23 | Discharge: 2022-09-23 | Disposition: A | Discharge status: Home / Self Care | Payer: Medicaid Other | Attending: Emergency Medicine | Admitting: Emergency Medicine

## 2022-09-23 DIAGNOSIS — S0512XD Contusion of eyeball and orbital tissues, left eye, subsequent encounter: Secondary | ICD-10-CM

## 2022-09-23 DIAGNOSIS — S0012XA Contusion of left eyelid and periocular area, initial encounter: Secondary | ICD-10-CM | POA: Diagnosis present

## 2022-09-23 DIAGNOSIS — R519 Headache, unspecified: Secondary | ICD-10-CM | POA: Diagnosis not present

## 2022-09-23 DIAGNOSIS — W2209XA Striking against other stationary object, initial encounter: Secondary | ICD-10-CM | POA: Diagnosis not present

## 2022-09-23 NOTE — ED Notes (Signed)
Dc instructions reviewed with patient. Patient voiced understanding. Dc with belongings.  °

## 2022-09-23 NOTE — Discharge Instructions (Signed)
You are seen today in the emergency department due to head injury.  The CT of your face was reassuring without any fractures, she was ED if you have any loss of vision or unable to move your eye otherwise follow-up with your primary as needed for follow-up.

## 2022-09-23 NOTE — ED Triage Notes (Signed)
Patient here POV from Home.  Endorses running into a Wall 2 Days ago. Injured her Right Eye in the Process. Swelling and bruising to same. No Changes in Vision. No LOC. No Anticoagulants.   Sent by UC for Imaging.  NAD Noted during Triage. A&Oc4. Gcs 15. Ambulatory.

## 2022-09-23 NOTE — ED Provider Notes (Signed)
[facility] Provider Note   CSN: [id number] Arrival date & time: 09/23/22  1525     History  Chief Complaint  Patient presents with   Eye Injury    Julia Santiago is a 32 y.o. female.   Eye Injury     Patient presents to the ED from urgent care due to periorbital swelling/contusion.  Patient had her head against a table 2 nights ago, she has been having bruising and swelling on the left eye.  There is no pain with eye movement, no vision changes, blurry vision, additional complaints.  Seen in urgent care for concern about facial fractures and to ED for further evaluation.  Home Medications Prior to Admission medications   Medication Sig Start Date End Date Taking? Authorizing Provider  buPROPion (WELLBUTRIN XL) 150 MG 24 hr tablet Take 1 tablet (150 mg total) by mouth every morning. 08/27/22   Salley Slaughter, NP  busPIRone (BUSPAR) 15 MG tablet Take 1 tablet (15 mg total) by mouth 2 (two) times daily. 08/27/22   Salley Slaughter, NP  hydrOXYzine (ATARAX) 25 MG tablet Take 1 tablet (25 mg total) by mouth at bedtime as needed for anxiety (sleep). 08/27/22   Salley Slaughter, NP  predniSONE (DELTASONE) 5 MG tablet Take 5 mg by mouth 4 (four) times daily - after meals and at bedtime.    [provider]  Prenatal Vit-Fe Fumarate-FA (PRENATAL VITAMINS) 28-0.8 MG TABS Take 1 tablet by mouth daily. 09/05/16   Rasch, Anderson Malta I, NP  sertraline (ZOLOFT) 100 MG tablet Take 1 tablet (100 mg total) by mouth daily. 08/27/22   Salley Slaughter, NP  sertraline (ZOLOFT) 50 MG tablet Take 1 tablet (50 mg total) by mouth daily. 08/27/22   Salley Slaughter, NP      Allergies    Penicillins    Review of Systems   Review of Systems  Physical Exam Updated Vital Signs BP (!) 144/90 (BP Location: Right Arm)   Pulse 92   Temp (!) 96.9 F (36.1 C) (Temporal)   Resp 18   Ht 5\' 7"  (1.702 m)   Wt 91.6 kg   LMP 09/05/2022   SpO2  100%   BMI 31.64 kg/m  Physical Exam Vitals and nursing note reviewed. Exam conducted with a chaperone present.  Constitutional:      General: She is not in acute distress.    Appearance: Normal appearance.  HENT:     Head: Normocephalic.     Comments: TTP left maxilla and inferior to left orbit Eyes:     General: No scleral icterus.    Extraocular Movements: Extraocular movements intact.     Pupils: Pupils are equal, round, and reactive to light.     Comments: Periorbital contusion and swelling left eye.  EOMI, no entrapment.  No nystagmus.  Pupils PERRLA without any teardrop or hyphema.  +subconjunctival hemorrhage (left)  Skin:    Coloration: Skin is not jaundiced.  Neurological:     Mental Status: She is alert. Mental status is at baseline.     Coordination: Coordination normal.     ED Results / Procedures / Treatments   Labs (all labs ordered are listed, but only abnormal results are displayed) Labs Reviewed - No data to display  EKG None  Radiology CT Maxillofacial Wo Contrast  Result Date: 09/23/2022 CLINICAL DATA:  Patient rental wall injuring right eye. Bruising and swelling. EXAM: CT HEAD WITHOUT CONTRAST CT MAXILLOFACIAL WITHOUT CONTRAST  TECHNIQUE: Multidetector CT imaging of the head and maxillofacial structures were performed using the standard protocol without intravenous contrast. Multiplanar CT image reconstructions of the maxillofacial structures were also generated. RADIATION DOSE REDUCTION: This exam was performed according to the departmental dose-optimization program which includes automated exposure control, adjustment of the mA and/or kV according to patient size and/or use of iterative reconstruction technique. COMPARISON:  None Available. FINDINGS: CT HEAD FINDINGS Brain: There is no evidence for acute hemorrhage, hydrocephalus, mass lesion, or abnormal extra-axial fluid collection. No definite CT evidence for acute infarction. Vascular: No hyperdense  vessel or unexpected calcification. Skull: No evidence for fracture. No worrisome lytic or sclerotic lesion. Other: None. CT MAXILLOFACIAL FINDINGS Osseous: No fracture or mandibular dislocation. No destructive process. Orbits: Negative. No traumatic or inflammatory finding. Sinuses: Chronic mucosal thickening noted in ethmoid air cells and both maxillary sinuses. Mastoid air cells are clear bilaterally. Soft tissues: Superficial soft tissue swelling/contusion noted in the right orbital and infraorbital region. IMPRESSION: 1. No acute intracranial abnormality. 2. Superficial soft tissue swelling/contusion in the right orbital and infraorbital region. 3. No evidence for facial bone fracture. 4. Chronic paranasal sinusitis. Electronically Signed   By: Misty Stanley M.D.   On: 09/23/2022 17:06   CT Head Wo Contrast  Result Date: 09/23/2022 CLINICAL DATA:  Patient rental wall injuring right eye. Bruising and swelling. EXAM: CT HEAD WITHOUT CONTRAST CT MAXILLOFACIAL WITHOUT CONTRAST TECHNIQUE: Multidetector CT imaging of the head and maxillofacial structures were performed using the standard protocol without intravenous contrast. Multiplanar CT image reconstructions of the maxillofacial structures were also generated. RADIATION DOSE REDUCTION: This exam was performed according to the departmental dose-optimization program which includes automated exposure control, adjustment of the mA and/or kV according to patient size and/or use of iterative reconstruction technique. COMPARISON:  None Available. FINDINGS: CT HEAD FINDINGS Brain: There is no evidence for acute hemorrhage, hydrocephalus, mass lesion, or abnormal extra-axial fluid collection. No definite CT evidence for acute infarction. Vascular: No hyperdense vessel or unexpected calcification. Skull: No evidence for fracture. No worrisome lytic or sclerotic lesion. Other: None. CT MAXILLOFACIAL FINDINGS Osseous: No fracture or mandibular dislocation. No destructive  process. Orbits: Negative. No traumatic or inflammatory finding. Sinuses: Chronic mucosal thickening noted in ethmoid air cells and both maxillary sinuses. Mastoid air cells are clear bilaterally. Soft tissues: Superficial soft tissue swelling/contusion noted in the right orbital and infraorbital region. IMPRESSION: 1. No acute intracranial abnormality. 2. Superficial soft tissue swelling/contusion in the right orbital and infraorbital region. 3. No evidence for facial bone fracture. 4. Chronic paranasal sinusitis. Electronically Signed   By: Misty Stanley M.D.   On: 09/23/2022 17:06    Procedures Procedures    Medications Ordered in ED Medications - No data to display  ED Course/ Medical Decision Making/ A&P                             Medical Decision Making Amount and/or Complexity of Data Reviewed Radiology: ordered.   This is a 32 year old presenting to the emergency department from urgent care due to left eye injury.  She has no visual complaints, no entrapment, no cranial nerve impairment or signs of globe injury. No painful EOM.  CT maxillofacial was ordered to evaluate for orbital wall fracture, the scan was negative.  Results discussed with the patient, advised her to follow-up with her primary as needed.  Return precautions were discussed with patient who verbalized understanding.  Final Clinical Impression(s) / ED Diagnoses Final diagnoses:  Periorbital contusion of left eye, subsequent encounter    Rx / DC Orders ED Discharge Orders     None         Sherrill Raring, PA-C 09/23/22 [place], [place], DO 09/23/22 1721

## 2022-09-26 ENCOUNTER — Ambulatory Visit (INDEPENDENT_AMBULATORY_CARE_PROVIDER_SITE_OTHER): Payer: Medicaid Other | Admitting: Licensed Clinical Social Worker

## 2022-09-26 DIAGNOSIS — F411 Generalized anxiety disorder: Secondary | ICD-10-CM

## 2022-09-26 DIAGNOSIS — F331 Major depressive disorder, recurrent, moderate: Secondary | ICD-10-CM

## 2022-09-26 NOTE — Progress Notes (Signed)
   THERAPIST PROGRESS NOTE  Session Time: 15  Participation Level: Active  Behavioral Response: CasualAlertAnxious and Depressed  Type of Therapy: Individual Therapy  Treatment Goals addressed:  Active     Anxiety Disorder CCP Problem  1 MDD     LTG: Patient will score less than 5 on the Generalized Anxiety Disorder 7 Scale (GAD-7) (Not Progressing)     Start:  07/17/21    Expected End:  03/08/23         STG: Patient will attend at least 80% of scheduled family sessions (Progressing)     Start:  07/17/21    Expected End:  03/08/23           Depression CCP Problem  1 GAD     LTG: [name] WILL SCORE LESS THAN 10 ON THE PATIENT HEALTH QUESTIONNAIRE (PHQ-9) (Completed/Met)     Start:  07/17/21    Expected End:  02/02/22    Resolved:  09/25/21      STG: [name] WILL ATTEND AT LEAST 80% OF SCHEDULED GROUP PSYCHOTHERAPY SESSIONS (Progressing)     Start:  07/17/21    Expected End:  03/08/23         STG: [name] WILL COMPLETE AT LEAST 80% OF ASSIGNED HOMEWORK (Progressing)     Start:  07/17/21    Expected End:  03/08/23         LTG: [name] WILL SCORE LESS THAN 10 ON THE PATIENT HEALTH QUESTIONNAIRE (PHQ-9)     Start:  07/31/22    Expected End:  03/08/23               ProgressTowards Goals: Progressing  Interventions: CBT, Motivational Interviewing, and Supportive  Summary: Julia Santiago is a 32 y.o. female who presents with depressed and anxious mood\affect.  Patient was pleasant, cooperative, maintained good eye contact.  She engaged in therapy session was dressed casually.  Patient comes in today with primary stressors of binge drinking, family stress, and work.  Patient reports that she has increased anxiety for tension and worry and decided to binge drink for the past 2 weeks.  Patient reports she only stopped after she fell into a wall and got a black eye.  LCSW spoke to patient not taking accountability for behavior such as binge drinking habits.   Patient reports increased stress with children and financials due to not being able to work and also children's behavior at school.  [name] reports poor boundary setting.  Suicidal/Homicidal: Nowithout intent/plan  Therapist Response:     Intervention/Plan: LCSW psychoanalytic therapy for patient to express thoughts, feelings and concerns.  LCSW supportive therapy for praise and encouragement.  LCSW educated patient on setting healthy boundaries.  LCSW spoke with consequences of excessive drinking.  Plan: Return again in 3 weeks.  Diagnosis: Major depressive disorder, recurrent episode, moderate (HCC)  GAD (generalized anxiety disorder)  Collaboration of Care: Other None today   Patient/Guardian was advised Release of Information must be obtained prior to any record release in order to collaborate their care with an outside provider. Patient/Guardian was advised if they have not already done so to contact the registration department to sign all necessary forms in order for [place] to release information regarding their care.   Consent: Patient/Guardian gives verbal consent for treatment and assignment of benefits for services provided during this visit. Patient/Guardian expressed understanding and agreed to proceed.   Dory Horn, LCSW 09/26/2022

## 2022-10-10 ENCOUNTER — Ambulatory Visit (INDEPENDENT_AMBULATORY_CARE_PROVIDER_SITE_OTHER): Payer: Medicaid Other | Admitting: Licensed Clinical Social Worker

## 2022-10-10 DIAGNOSIS — F331 Major depressive disorder, recurrent, moderate: Secondary | ICD-10-CM

## 2022-10-10 DIAGNOSIS — F411 Generalized anxiety disorder: Secondary | ICD-10-CM

## 2022-10-10 NOTE — Progress Notes (Signed)
THERAPIST PROGRESS NOTE  Session Time: 30  Participation Level: Active  Behavioral Response: CasualAlertEuthymic  Type of Therapy: Individual Therapy  Treatment Goals addressed:  Active     Anxiety Disorder CCP Problem  1 MDD     LTG: Patient will score less than 5 on the Generalized Anxiety Disorder 7 Scale (GAD-7) (Progressing)     Start:  07/17/21    Expected End:  03/08/23       Goal Note     6 to a 3          STG: Patient will attend at least 80% of scheduled family sessions (Progressing)     Start:  07/17/21    Expected End:  03/08/23           Depression CCP Problem  1 GAD     LTG: Julia Santiago WILL SCORE LESS THAN 10 ON THE PATIENT HEALTH QUESTIONNAIRE (PHQ-9) (Completed/Met)     Start:  07/17/21    Expected End:  02/02/22    Resolved:  09/25/21      STG: Julia Santiago WILL ATTEND AT LEAST 80% OF SCHEDULED GROUP PSYCHOTHERAPY SESSIONS (Progressing)     Start:  07/17/21    Expected End:  03/08/23         STG: Julia Santiago WILL COMPLETE AT LEAST 80% OF ASSIGNED HOMEWORK (Progressing)     Start:  07/17/21    Expected End:  03/08/23         LTG: Julia Santiago WILL SCORE LESS THAN 10 ON THE PATIENT HEALTH QUESTIONNAIRE (PHQ-9) (Progressing)     Start:  07/31/22    Expected End:  03/08/23          Goal Note     Pt scored a 6 today             ProgressTowards Goals: Progressing  Interventions: CBT, Motivational Interviewing, and Supportive  Summary: Julia Santiago is a 32 y.o. female who presents with euthymic mood\affect.  Patient was pleasant, cooperative, maintained good eye contact.  She engaged well in therapy session was dressed casually.  Patient reports everything has been going "well".  She reports an increase in open mood and a decrease in depression and anxiety.  [name] reports that this has been due to her switch on medications to Wellbutrin.  Patient reports that she feels like she is becoming more organized.  She reports formal supports  and [facility] resource center.  Patient reports better coping skills with dealing with her children.  Patient reports motivation to look for employment.  Suicidal/Homicidal: Nowithout intent/plan  Therapist Response:   Intervention\plan: LCSW psychoanalytic therapy for patient to express thoughts, feelings and emotions.  LCSW used motivational interviewing for open-ended questions, positive affirmations, and reflective listening.  LCSW administered GAD-7.  LCSW administered a PHQ-9.  LCSW reviewed scores with patient.  LCSW notes scores are below goals and treatment plan.  LCSW will readminister GAD-7 and PHQ-9 before updating treatment goals to completed.  Plan: Return again in 3 weeks.  Diagnosis: Major depressive disorder, recurrent episode, moderate  GAD (generalized anxiety disorder)  Collaboration of Care: Other none today   Patient/Guardian was advised Release of Information must be obtained prior to any record release in order to collaborate their care with an outside provider. Patient/Guardian was advised if they have not already done so to contact the registration department to sign all necessary forms in order for [place] to release information regarding their care.   Consent: Patient/Guardian gives verbal consent for treatment and assignment  of benefits for services provided during this visit. Patient/Guardian expressed understanding and agreed to proceed.   Dory Horn, LCSW 10/10/2022

## 2022-11-07 ENCOUNTER — Ambulatory Visit (HOSPITAL_COMMUNITY): Payer: Medicaid Other | Admitting: Licensed Clinical Social Worker

## 2022-11-28 ENCOUNTER — Ambulatory Visit (INDEPENDENT_AMBULATORY_CARE_PROVIDER_SITE_OTHER): Payer: Medicaid Other | Admitting: Licensed Clinical Social Worker

## 2022-11-28 DIAGNOSIS — F331 Major depressive disorder, recurrent, moderate: Secondary | ICD-10-CM | POA: Diagnosis not present

## 2022-11-28 DIAGNOSIS — F411 Generalized anxiety disorder: Secondary | ICD-10-CM | POA: Diagnosis not present
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
## 2023-01-09 ENCOUNTER — Ambulatory Visit (HOSPITAL_COMMUNITY): Payer: Medicaid Other | Admitting: Licensed Clinical Social Worker

## 2023-01-30 ENCOUNTER — Ambulatory Visit (HOSPITAL_COMMUNITY): Payer: Medicaid Other | Admitting: Licensed Clinical Social Worker

## 2023-02-02 ENCOUNTER — Other Ambulatory Visit (HOSPITAL_COMMUNITY): Payer: Self-pay | Admitting: Psychiatry

## 2023-02-02 DIAGNOSIS — F331 Major depressive disorder, recurrent, moderate: Secondary | ICD-10-CM

## 2023-02-02 DIAGNOSIS — F902 Attention-deficit hyperactivity disorder, combined type: Secondary | ICD-10-CM

## 2023-03-07 ENCOUNTER — Other Ambulatory Visit (HOSPITAL_COMMUNITY): Payer: Self-pay | Admitting: Psychiatry

## 2023-03-07 DIAGNOSIS — F331 Major depressive disorder, recurrent, moderate: Secondary | ICD-10-CM

## 2023-03-07 DIAGNOSIS — F411 Generalized anxiety disorder: Secondary | ICD-10-CM

## 2023-03-07 DIAGNOSIS — F902 Attention-deficit hyperactivity disorder, combined type: Secondary | ICD-10-CM

## 2023-04-03 ENCOUNTER — Encounter (HOSPITAL_COMMUNITY): Payer: Self-pay | Admitting: Psychiatry

## 2023-04-03 ENCOUNTER — Telehealth (INDEPENDENT_AMBULATORY_CARE_PROVIDER_SITE_OTHER): Payer: Medicaid Other | Admitting: Psychiatry

## 2023-04-03 DIAGNOSIS — F902 Attention-deficit hyperactivity disorder, combined type: Secondary | ICD-10-CM

## 2023-04-03 DIAGNOSIS — F1091 Alcohol use, unspecified, in remission: Secondary | ICD-10-CM

## 2023-04-03 DIAGNOSIS — F331 Major depressive disorder, recurrent, moderate: Secondary | ICD-10-CM

## 2023-04-03 DIAGNOSIS — F411 Generalized anxiety disorder: Secondary | ICD-10-CM

## 2023-04-03 MED ORDER — SERTRALINE HCL 50 MG PO TABS: 50.0000 mg | ORAL_TABLET | Freq: Every day | ORAL | 3 refills | Status: DC

## 2023-04-03 MED ORDER — BUPROPION HCL ER (XL) 150 MG PO TB24: 150.0000 mg | ORAL_TABLET | ORAL | 3 refills | Status: DC

## 2023-04-03 MED ORDER — HYDROXYZINE HCL 25 MG PO TABS: 25.0000 mg | ORAL_TABLET | Freq: Every evening | ORAL | 3 refills | Status: DC | PRN

## 2023-04-03 MED ORDER — SERTRALINE HCL 100 MG PO TABS: 100.0000 mg | ORAL_TABLET | Freq: Every day | ORAL | 3 refills | Status: DC

## 2023-04-03 MED ORDER — BUPROPION HCL ER (XL) 300 MG PO TB24: 300.0000 mg | ORAL_TABLET | ORAL | 3 refills | Status: DC

## 2023-04-03 MED ORDER — BUSPIRONE HCL 15 MG PO TABS: 15.0000 mg | ORAL_TABLET | Freq: Two times a day (BID) | ORAL | 3 refills | Status: DC

## 2023-04-03 NOTE — Progress Notes (Signed)
BH MD/PA/NP OP Progress Note Virtual Visit via Video Note  I connected with Julia Santiago on 04/03/23 at  9:00 AM EDT by a video enabled telemedicine application and verified that I am speaking with the correct person using two identifiers.  Location: Patient: Home Provider: Clinic   I discussed the limitations of evaluation and management by telemedicine and the availability of in person appointments. The patient expressed understanding and agreed to proceed.  I provided 30 minutes of non-face-to-face time during this encounter.   04/03/2023 9:12 AM Julia Santiago  MRN:  [id number]  Chief Complaint: "The Wellbutrin helps"  HPI: 32 year old female seen today for follow-up psychiatric evaluation.  She has a psychiatric history of anxiety and depression.  She is currently managed on BuSpar 15 milligrams twice daily, Wellbutrin XL 150 mg daily, hydroxyzine 25 mg nightly, and Zoloft 150 mg daily.  She notes her medications are somewhat effective managing her psychiatric condition.  Today she is well-groomed, pleasant, cooperative, engaged in conversation, maintained eye contact.  She informed Clinical research associate that Wellbutrin works well and requested an increase.  She informed Clinical research associate that since starting Wellbutrin she is able to focus more.  She notes that she finds that she is also more attentive, less disorganized, and able to complete mentally taxing tasks.    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  She denies other substance use.  Since her last visit she informed writer that her anxiety and depression has improved.  She does note that she has been dealing with life stressors however that continue to exacerbate her anxiety and depression.  Patient informed Clinical research associate that she lost her job and her car on the same day.  She also notes that her 40-year-old son has been having behavior issues.  She notes that for awhile he went to stay with his grandmother but is now home and  continues to have behavior issues.  She informed Clinical research associate that she thinks that something may be wrong and is taking him to see a psychiatrist.    Patient notes that the above exacerbates anxiety and depression.  Today provider conducted a GAD-7 and patient scored a 9, at her last visit she scored a 10. Provider also conducted PHQ-9 patient scored a 12, at her last visit she scored a 18.  She endorses sleeping 4 to 6 hours nightly.  Today she denies SI/HI/VAH or mania.  Today Wellbutrin XL 150 mg increased to 300 mg daily to help manage depression and symptoms of ADHD.  Patient referred to [facility].  She will continue her other medications as prescribed. No other concerns noted at this time.  Visit Diagnosis:    ICD-10-CM   1. Attention deficit hyperactivity disorder (ADHD), combined type  F90.2 buPROPion (WELLBUTRIN XL) 300 MG 24 hr tablet    DISCONTINUED: buPROPion (WELLBUTRIN XL) 150 MG 24 hr tablet    2. Major depressive disorder, recurrent episode, moderate (HCC)  F33.1 busPIRone (BUSPAR) 15 MG tablet    sertraline (ZOLOFT) 50 MG tablet    sertraline (ZOLOFT) 100 MG tablet    buPROPion (WELLBUTRIN XL) 300 MG 24 hr tablet    DISCONTINUED: buPROPion (WELLBUTRIN XL) 150 MG 24 hr tablet    3. GAD (generalized anxiety disorder)  F41.1 busPIRone (BUSPAR) 15 MG tablet    hydrOXYzine (ATARAX) 25 MG tablet    sertraline (ZOLOFT) 50 MG tablet    sertraline (ZOLOFT) 100 MG tablet       Past Psychiatric History:  Anxiety and depression  Past Medical History:  Past Medical History:  Diagnosis Date   Anxiety    Depression    was on certaline for pp depression   Gestational diabetes    Headache     Past Surgical History:  Procedure Laterality Date   CESAREAN SECTION N/A 01/29/2015   Procedure: CESAREAN SECTION;  Surgeon: Brock Bad, MD;  Location: [facility];  Service: Obstetrics;  Laterality: N/A;   CESAREAN SECTION N/A 12/21/2016   Procedure: REPEAT CESAREAN SECTION;  Surgeon:  Catalina Antigua, MD;  Location: [facility];  Service: Obstetrics;  Laterality: N/A;   CESAREAN SECTION WITH BILATERAL TUBAL LIGATION Bilateral 11/02/2020   Procedure: CESAREAN SECTION WITH BILATERAL TUBAL LIGATION;  Surgeon: Federico Flake, MD;  Location: [facility];  Service: Obstetrics;  Laterality: Bilateral;    Family Psychiatric History: Mother anxiety and depression, maternal aunt bipolar disorder, maternal grandmother unknown psychiatric conditions  Family History:  Family History  Problem Relation Age of Onset   Anxiety disorder Mother    Depression Mother    Heart disease Father    Stroke Maternal Grandmother    Arthritis Maternal Grandmother    Heart disease Maternal Grandfather    Alcohol abuse Maternal Grandfather    Cancer Paternal Grandmother     Social History:  Social History   Socioeconomic History   Marital status: Single    Spouse name: Not on file   Number of children: 2   Years of education: Not on file   Highest education level: Not on file  Occupational History   Occupation: UNEMPLOYED  Tobacco Use   Smoking status: Some Days    Types: Cigars   Smokeless tobacco: Never  Vaping Use   Vaping status: Never Used  Substance and Sexual Activity   Alcohol use: Yes    Comment: weekends   Drug use: No   Sexual activity: Not Currently    Birth control/protection: None  Other Topics Concern   Not on file  Social History Narrative   Not on file   Social Determinants of Health   Financial Resource Strain: High Risk (07/17/2021)   Overall Financial Resource Strain (CARDIA)    Difficulty of Paying Living Expenses: Hard  Food Insecurity: Food Insecurity Present (07/17/2021)   Hunger Vital Sign    Worried About Running Out of Food in the Last Year: Sometimes true    Ran Out of Food in the Last Year: Sometimes true  Transportation Needs: No Transportation Needs (07/17/2021)   PRAPARE - Administrator, Civil Service (Medical): No     Lack of Transportation (Non-Medical): No  Physical Activity: Inactive (07/17/2021)   Exercise Vital Sign    Days of Exercise per Week: 0 days    Minutes of Exercise per Session: 0 min  Stress: Stress Concern Present (07/17/2021)   [employer] of Occupational Health - Occupational Stress Questionnaire    Feeling of Stress : Rather much  Social Connections: Socially Isolated (07/17/2021)   Social Connection and Isolation Panel [NHANES]    Frequency of Communication with Friends and Family: More than three times a week    Frequency of Social Gatherings with Friends and Family: Never    Attends Religious Services: Never    Database administrator or Organizations: No    Attends Banker Meetings: Never    Marital Status: Never married    Allergies:  Allergies  Allergen Reactions   Penicillins     Has  patient had a PCN reaction causing immediate rash, facial/tongue/throat swelling, SOB or lightheadedness with hypotension: No Has patient had a PCN reaction causing severe rash involving mucus membranes or skin necrosis: No Has patient had a PCN reaction that required hospitalization: No Has patient had a PCN reaction occurring within the last 10 years: No If all of the above answers are "NO", then may proceed with Cephalosporin use.  As a child-reaction unknown    Metabolic Disorder Labs: Lab Results  Component Value Date   HGBA1C 5.6 04/26/2020   No results found for: "PROLACTIN" No results found for: "CHOL", "TRIG", "HDL", "CHOLHDL", "VLDL", "LDLCALC" No results found for: "TSH"  Therapeutic Level Labs: No results found for: "LITHIUM" No results found for: "VALPROATE" No results found for: "CBMZ"  Current Medications: Current Outpatient Medications  Medication Sig Dispense Refill   buPROPion (WELLBUTRIN XL) 300 MG 24 hr tablet Take 1 tablet (300 mg total) by mouth every morning. 300 tablet 3   busPIRone (BUSPAR) 15 MG tablet Take 1 tablet (15 mg total) by mouth  2 (two) times daily. 60 tablet 3   hydrOXYzine (ATARAX) 25 MG tablet Take 1 tablet (25 mg total) by mouth at bedtime as needed for anxiety (sleep). 30 tablet 3   predniSONE (DELTASONE) 5 MG tablet Take 5 mg by mouth 4 (four) times daily - after meals and at bedtime.     Prenatal Vit-Fe Fumarate-FA (PRENATAL VITAMINS) 28-0.8 MG TABS Take 1 tablet by mouth daily. 60 tablet 3   sertraline (ZOLOFT) 100 MG tablet Take 1 tablet (100 mg total) by mouth daily. 30 tablet 3   sertraline (ZOLOFT) 50 MG tablet Take 1 tablet (50 mg total) by mouth daily. 30 tablet 3   No current facility-administered medications for this visit.     Musculoskeletal: Strength & Muscle Tone: within normal limits and telehealth visit Gait & Station: normal, telehealth visit Patient leans: N/A  Psychiatric Specialty Exam: Review of Systems  unknown if currently breastfeeding.There is no height or weight on file to calculate BMI.  General Appearance: Well Groomed  Eye Contact:  Good  Speech:  Clear and Coherent and Normal Rate  Volume:  Normal  Mood:  Anxious and Depressed, improving  Affect:  Appropriate and Congruent  Thought Process:  Coherent, Goal Directed, and Linear  Orientation:  Full (Time, Place, and Person)  Thought Content: WDL and Logical   Suicidal Thoughts:  No  Homicidal Thoughts:  No  Memory:  Immediate;   Good Recent;   Good Remote;   Good  Judgement:  Good  Insight:  Good  Psychomotor Activity:  Normal  Concentration:  Concentration: Good and Attention Span: Good  Recall:  Good  Fund of Knowledge: Good  Language: Good  Akathisia:  No  Handed:  Right  AIMS (if indicated): not done  Assets:  Communication Skills Desire for Improvement Financial Resources/Insurance Housing Physical Health Social Support  ADL's:  Intact  Cognition: WNL  Sleep:  Fair   Screenings: AUDIT    Flowsheet Row Video Visit from 06/06/2022 in [facility] Counselor from 07/17/2021  in [facility]  Alcohol Use Disorder Identification Test Final Score (AUDIT) 13 9      GAD-7    Flowsheet Row Video Visit from 04/03/2023 in [facility] Counselor from 10/10/2022 in [facility] Video Visit from 08/27/2022 in [facility] Video Visit from 06/06/2022 in [facility] Counselor  from 02/20/2022 in [facility]  Total GAD-7 Score 9 3 12 11 12       PHQ2-9    Flowsheet Row Video Visit from 04/03/2023 in [facility] Counselor from 10/10/2022 in [facility] Video Visit from 08/27/2022 in [facility] Video Visit from 06/06/2022 in [facility] Counselor from 02/20/2022 in [facility]  PHQ-2 Total Score 2 1 3 2 2   PHQ-9 Total Score 12 6 18 10 10       Flowsheet Row ED from 09/23/2022 in [facility] Emergency Department at [facility] ED from 09/15/2021 in [facility] Emergency Department at [facility] Counselor from 07/17/2021 in [facility]  C-SSRS RISK CATEGORY No Risk No Risk No Risk        Assessment and Plan: Patient reports that her anxiety, depression, and symptoms of ADHD has improved since her last visit.  She has been sober from alcohol since May.Today Wellbutrin XL 150 mg increased to 300 mg daily to help manage depression and symptoms of ADHD.  Patient referred to [facility].  She will continue her other medications as prescribed.  1. Attention deficit hyperactivity disorder (ADHD), combined type  Increased- buPROPion (WELLBUTRIN XL) 300 MG 24 hr tablet; Take 1 tablet (300 mg total) by mouth every morning.  Dispense: 300 tablet; Refill: 3  2. Major depressive disorder, recurrent episode, moderate (HCC)  Continue-  busPIRone (BUSPAR) 15 MG tablet; Take 1 tablet (15 mg total) by mouth 2 (two) times daily.  Dispense: 60 tablet; Refill: 3 Continue- sertraline (ZOLOFT) 50 MG tablet; Take 1 tablet (50 mg total) by mouth daily.  Dispense: 30 tablet; Refill: 3 Continue- sertraline (ZOLOFT) 100 MG tablet; Take 1 tablet (100 mg total) by mouth daily.  Dispense: 30 tablet; Refill: 3 Increased- buPROPion (WELLBUTRIN XL) 300 MG 24 hr tablet; Take 1 tablet (300 mg total) by mouth every morning.  Dispense: 300 tablet; Refill: 3  3. GAD (generalized anxiety disorder)  Continue- busPIRone (BUSPAR) 15 MG tablet; Take 1 tablet (15 mg total) by mouth 2 (two) times daily.  Dispense: 60 tablet; Refill: 3 Continue- hydrOXYzine (ATARAX) 25 MG tablet; Take 1 tablet (25 mg total) by mouth at bedtime as needed for anxiety (sleep).  Dispense: 30 tablet; Refill: 3 Continue- sertraline (ZOLOFT) 50 MG tablet; Take 1 tablet (50 mg total) by mouth daily.  Dispense: 30 tablet; Refill: 3 Continue- sertraline (ZOLOFT) 100 MG tablet; Take 1 tablet (100 mg total) by mouth daily.  Dispense: 30 tablet; Refill: 3  Follow-up in 3 months Follow-up with therapy  Shanna Cisco, NP 04/03/2023, 9:12 AM

## 2023-05-01 ENCOUNTER — Other Ambulatory Visit (HOSPITAL_COMMUNITY): Payer: Self-pay | Admitting: Psychiatry

## 2023-05-01 DIAGNOSIS — F411 Generalized anxiety disorder: Secondary | ICD-10-CM

## 2023-05-01 DIAGNOSIS — F331 Major depressive disorder, recurrent, moderate: Secondary | ICD-10-CM

## 2023-06-26 ENCOUNTER — Encounter (HOSPITAL_COMMUNITY): Payer: Self-pay

## 2023-06-26 ENCOUNTER — Telehealth (HOSPITAL_COMMUNITY): Payer: Medicaid Other | Admitting: Psychiatry

## 2023-07-05 ENCOUNTER — Encounter (HOSPITAL_COMMUNITY): Payer: Self-pay | Admitting: Psychiatry

## 2023-07-05 ENCOUNTER — Telehealth (HOSPITAL_COMMUNITY): Payer: Medicaid Other | Admitting: Psychiatry

## 2023-07-05 DIAGNOSIS — F902 Attention-deficit hyperactivity disorder, combined type: Secondary | ICD-10-CM | POA: Diagnosis not present

## 2023-07-05 DIAGNOSIS — F411 Generalized anxiety disorder: Secondary | ICD-10-CM | POA: Diagnosis not present

## 2023-07-05 DIAGNOSIS — F331 Major depressive disorder, recurrent, moderate: Secondary | ICD-10-CM

## 2023-07-05 MED ORDER — SERTRALINE HCL 50 MG PO TABS: 50.0000 mg | ORAL_TABLET | Freq: Every day | ORAL | 2 refills | Status: DC

## 2023-07-05 MED ORDER — SERTRALINE HCL 100 MG PO TABS: 100.0000 mg | ORAL_TABLET | Freq: Every day | ORAL | 3 refills | Status: DC

## 2023-07-05 MED ORDER — BUSPIRONE HCL 15 MG PO TABS: 15.0000 mg | ORAL_TABLET | Freq: Two times a day (BID) | ORAL | 3 refills | Status: DC

## 2023-07-05 MED ORDER — BUPROPION HCL ER (XL) 300 MG PO TB24: 300.0000 mg | ORAL_TABLET | ORAL | 3 refills | Status: DC

## 2023-07-05 MED ORDER — HYDROXYZINE HCL 25 MG PO TABS: 25.0000 mg | ORAL_TABLET | Freq: Every evening | ORAL | 3 refills | Status: DC | PRN

## 2023-07-05 NOTE — Progress Notes (Signed)
BH MD/PA/NP OP Progress Note Virtual Visit via Video Note  I connected with Julia Santiago on 07/05/23 at 10:30 AM EST by a video enabled telemedicine application and verified that I am speaking with the correct person using two identifiers.  Location: Patient: Home Provider: Clinic   I discussed the limitations of evaluation and management by telemedicine and the availability of in person appointments. The patient expressed understanding and agreed to proceed.  I provided 30 minutes of non-face-to-face time during this encounter.   07/05/2023 10:50 AM Julia Santiago  MRN:  [id number]  Chief Complaint: "I am doing okay"  HPI: 32 year old female seen today for follow-up psychiatric evaluation.  She has a psychiatric history of anxiety and depression.  She is currently managed on BuSpar 15 milligrams twice daily, Wellbutrin XL 300 mg daily, hydroxyzine 25 mg nightly, and Zoloft 150 mg daily.  She notes her medications are effective managing her psychiatric condition.  Today she is well-groomed, pleasant, cooperative, engaged in conversation, maintained eye contact.  She informed Clinical research associate that she has been doing okay.  She notes that she is taking 1 day at a time in managing her household.  She informed Clinical research associate that her 3 kids are doing well.  She still has concerns about her 83-year-old side but reports that recently he was diagnosed with ADHD.    Since her last visit she informed writer that her anxiety depression continues to be well-managed.  Provider conducted a GAD-7 and patient scored a 6, at her last visit she scored a 9.  Provider also conducted PHQ-9 the patient scored an 8, at her last visit she scored a 12.  Patient informed writer that her sleep is adequate.  She does note that her appetite has increased and reports that she has gained some weight.  Today she denies SI/HI/AVH, mania, paranoia.  No medication changes made today.  Patient is able to continue medications as  prescribed.  No other concerns noted at this time.  Visit Diagnosis:    ICD-10-CM   1. Attention deficit hyperactivity disorder (ADHD), combined type  F90.2 buPROPion (WELLBUTRIN XL) 300 MG 24 hr tablet    2. Major depressive disorder, recurrent episode, moderate (HCC)  F33.1 buPROPion (WELLBUTRIN XL) 300 MG 24 hr tablet    busPIRone (BUSPAR) 15 MG tablet    sertraline (ZOLOFT) 50 MG tablet    sertraline (ZOLOFT) 100 MG tablet    3. GAD (generalized anxiety disorder)  F41.1 busPIRone (BUSPAR) 15 MG tablet    hydrOXYzine (ATARAX) 25 MG tablet    sertraline (ZOLOFT) 50 MG tablet    sertraline (ZOLOFT) 100 MG tablet        Past Psychiatric History: Anxiety and depression  Past Medical History:  Past Medical History:  Diagnosis Date   Anxiety    Depression    was on certaline for pp depression   Gestational diabetes    Headache     Past Surgical History:  Procedure Laterality Date   CESAREAN SECTION N/A 01/29/2015   Procedure: CESAREAN SECTION;  Surgeon: Brock Bad, MD;  Location: [facility];  Service: Obstetrics;  Laterality: N/A;   CESAREAN SECTION N/A 12/21/2016   Procedure: REPEAT CESAREAN SECTION;  Surgeon: Catalina Antigua, MD;  Location: [facility];  Service: Obstetrics;  Laterality: N/A;   CESAREAN SECTION WITH BILATERAL TUBAL LIGATION Bilateral 11/02/2020   Procedure: CESAREAN SECTION WITH BILATERAL TUBAL LIGATION;  Surgeon: Federico Flake, MD;  Location: [facility];  Service: Obstetrics;  Laterality:  Bilateral;    Family Psychiatric History: Mother anxiety and depression, maternal aunt bipolar disorder, maternal grandmother unknown psychiatric conditions  Family History:  Family History  Problem Relation Age of Onset   Anxiety disorder Mother    Depression Mother    Heart disease Father    Stroke Maternal Grandmother    Arthritis Maternal Grandmother    Heart disease Maternal Grandfather    Alcohol abuse Maternal Grandfather    Cancer Paternal  Grandmother     Social History:  Social History   Socioeconomic History   Marital status: Single    Spouse name: Not on file   Number of children: 2   Years of education: Not on file   Highest education level: Not on file  Occupational History   Occupation: UNEMPLOYED  Tobacco Use   Smoking status: Some Days    Types: Cigars   Smokeless tobacco: Never  Vaping Use   Vaping status: Never Used  Substance and Sexual Activity   Alcohol use: Yes    Comment: weekends   Drug use: No   Sexual activity: Not Currently    Birth control/protection: None  Other Topics Concern   Not on file  Social History Narrative   Not on file   Social Drivers of Health   Financial Resource Strain: High Risk (07/17/2021)   Overall Financial Resource Strain (CARDIA)    Difficulty of Paying Living Expenses: Hard  Food Insecurity: Food Insecurity Present (07/17/2021)   Hunger Vital Sign    Worried About Running Out of Food in the Last Year: Sometimes true    Ran Out of Food in the Last Year: Sometimes true  Transportation Needs: No Transportation Needs (07/17/2021)   PRAPARE - Administrator, Civil Service (Medical): No    Lack of Transportation (Non-Medical): No  Physical Activity: Inactive (07/17/2021)   Exercise Vital Sign    Days of Exercise per Week: 0 days    Minutes of Exercise per Session: 0 min  Stress: Stress Concern Present (07/17/2021)   [employer] of Occupational Health - Occupational Stress Questionnaire    Feeling of Stress : Rather much  Social Connections: Socially Isolated (07/17/2021)   Social Connection and Isolation Panel [NHANES]    Frequency of Communication with Friends and Family: More than three times a week    Frequency of Social Gatherings with Friends and Family: Never    Attends Religious Services: Never    Database administrator or Organizations: No    Attends Banker Meetings: Never    Marital Status: Never married    Allergies:   Allergies  Allergen Reactions   Penicillins     Has patient had a PCN reaction causing immediate rash, facial/tongue/throat swelling, SOB or lightheadedness with hypotension: No Has patient had a PCN reaction causing severe rash involving mucus membranes or skin necrosis: No Has patient had a PCN reaction that required hospitalization: No Has patient had a PCN reaction occurring within the last 10 years: No If all of the above answers are "NO", then may proceed with Cephalosporin use.  As a child-reaction unknown    Metabolic Disorder Labs: Lab Results  Component Value Date   HGBA1C 5.6 04/26/2020   No results found for: "PROLACTIN" No results found for: "CHOL", "TRIG", "HDL", "CHOLHDL", "VLDL", "LDLCALC" No results found for: "TSH"  Therapeutic Level Labs: No results found for: "LITHIUM" No results found for: "VALPROATE" No results found for: "CBMZ"  Current Medications: Current Outpatient Medications  Medication Sig Dispense Refill   buPROPion (WELLBUTRIN XL) 300 MG 24 hr tablet Take 1 tablet (300 mg total) by mouth every morning. 300 tablet 3   busPIRone (BUSPAR) 15 MG tablet Take 1 tablet (15 mg total) by mouth 2 (two) times daily. 60 tablet 3   hydrOXYzine (ATARAX) 25 MG tablet Take 1 tablet (25 mg total) by mouth at bedtime as needed for anxiety (sleep). 30 tablet 3   predniSONE (DELTASONE) 5 MG tablet Take 5 mg by mouth 4 (four) times daily - after meals and at bedtime.     Prenatal Vit-Fe Fumarate-FA (PRENATAL VITAMINS) 28-0.8 MG TABS Take 1 tablet by mouth daily. 60 tablet 3   sertraline (ZOLOFT) 100 MG tablet Take 1 tablet (100 mg total) by mouth daily. 30 tablet 3   sertraline (ZOLOFT) 50 MG tablet Take 1 tablet (50 mg total) by mouth daily. 90 tablet 2   No current facility-administered medications for this visit.     Musculoskeletal: Strength & Muscle Tone: within normal limits and telehealth visit Gait & Station: normal, telehealth visit Patient leans:  N/A  Psychiatric Specialty Exam: Review of Systems  unknown if currently breastfeeding.There is no height or weight on file to calculate BMI.  General Appearance: Well Groomed  Eye Contact:  Good  Speech:  Clear and Coherent and Normal Rate  Volume:  Normal  Mood:  Euthymic  Affect:  Appropriate and Congruent  Thought Process:  Coherent, Goal Directed, and Linear  Orientation:  Full (Time, Place, and Person)  Thought Content: WDL and Logical   Suicidal Thoughts:  No  Homicidal Thoughts:  No  Memory:  Immediate;   Good Recent;   Good Remote;   Good  Judgement:  Good  Insight:  Good  Psychomotor Activity:  Normal  Concentration:  Concentration: Good and Attention Span: Good  Recall:  Good  Fund of Knowledge: Good  Language: Good  Akathisia:  No  Handed:  Right  AIMS (if indicated): not done  Assets:  Communication Skills Desire for Improvement Financial Resources/Insurance Housing Physical Health Social Support  ADL's:  Intact  Cognition: WNL  Sleep:  Good   Screenings: AUDIT    Flowsheet Row Video Visit from 06/06/2022 in [facility] Counselor from 07/17/2021 in [facility]  Alcohol Use Disorder Identification Test Final Score (AUDIT) 13 9      GAD-7    Flowsheet Row Video Visit from 07/05/2023 in [facility] Video Visit from 04/03/2023 in [facility] Counselor from 10/10/2022 in [facility] Video Visit from 08/27/2022 in [facility] Video Visit from 06/06/2022 in [facility]  Total GAD-7 Score 6 9 3 12 11       PHQ2-9    Flowsheet Row Video Visit from 07/05/2023 in [facility] Video Visit from 04/03/2023 in [facility] Counselor from 10/10/2022 in [facility] Video Visit from 08/27/2022  in [facility] Video Visit from 06/06/2022 in [facility]  PHQ-2 Total Score 1 2 1 3 2   PHQ-9 Total Score 8 12 6 18 10       Flowsheet Row ED from 09/23/2022 in [facility] Emergency Department at [facility] ED from 09/15/2021 in [facility] Emergency Department at [facility] Counselor from 07/17/2021 in [facility]  C-SSRS RISK CATEGORY No Risk No  Risk No Risk        Assessment and Plan: Patient reports that her anxiety and depression has improved since her last visit.   No medication changes made today.  Patient is able to continue medications as prescribed.    1. Attention deficit hyperactivity disorder (ADHD), combined type  Continue- buPROPion (WELLBUTRIN XL) 300 MG 24 hr tablet; Take 1 tablet (300 mg total) by mouth every morning.  Dispense: 300 tablet; Refill: 3  2. Major depressive disorder, recurrent episode, moderate (HCC)  Continue- busPIRone (BUSPAR) 15 MG tablet; Take 1 tablet (15 mg total) by mouth 2 (two) times daily.  Dispense: 60 tablet; Refill: 3 Continue- sertraline (ZOLOFT) 50 MG tablet; Take 1 tablet (50 mg total) by mouth daily.  Dispense: 30 tablet; Refill: 3 Continue- sertraline (ZOLOFT) 100 MG tablet; Take 1 tablet (100 mg total) by mouth daily.  Dispense: 30 tablet; Refill: 3 Increased- buPROPion (WELLBUTRIN XL) 300 MG 24 hr tablet; Take 1 tablet (300 mg total) by mouth every morning.  Dispense: 300 tablet; Refill: 3  3. GAD (generalized anxiety disorder)  Continue- busPIRone (BUSPAR) 15 MG tablet; Take 1 tablet (15 mg total) by mouth 2 (two) times daily.  Dispense: 60 tablet; Refill: 3 Continue- hydrOXYzine (ATARAX) 25 MG tablet; Take 1 tablet (25 mg total) by mouth at bedtime as needed for anxiety (sleep).  Dispense: 30 tablet; Refill: 3 Continue- sertraline (ZOLOFT) 50 MG tablet; Take 1 tablet (50 mg total) by mouth daily.  Dispense: 30 tablet; Refill:  3 Continue- sertraline (ZOLOFT) 100 MG tablet; Take 1 tablet (100 mg total) by mouth daily.  Dispense: 30 tablet; Refill: 3  Follow-up in 2.5 months Follow-up with therapy  Shanna Cisco, NP 07/05/2023, 10:50 AM

## 2023-09-13 ENCOUNTER — Encounter (HOSPITAL_COMMUNITY): Payer: Self-pay | Admitting: Psychiatry

## 2023-09-13 ENCOUNTER — Telehealth (HOSPITAL_COMMUNITY): Payer: Medicaid Other | Admitting: Psychiatry

## 2023-09-13 DIAGNOSIS — Z0000 Encounter for general adult medical examination without abnormal findings: Secondary | ICD-10-CM

## 2023-09-13 DIAGNOSIS — F902 Attention-deficit hyperactivity disorder, combined type: Secondary | ICD-10-CM

## 2023-09-13 DIAGNOSIS — F411 Generalized anxiety disorder: Secondary | ICD-10-CM | POA: Diagnosis not present

## 2023-09-13 DIAGNOSIS — F331 Major depressive disorder, recurrent, moderate: Secondary | ICD-10-CM

## 2023-09-13 MED ORDER — BUPROPION HCL ER (XL) 300 MG PO TB24: 300.0000 mg | ORAL_TABLET | ORAL | 3 refills | Status: DC

## 2023-09-13 MED ORDER — BUSPIRONE HCL 15 MG PO TABS: 15.0000 mg | ORAL_TABLET | Freq: Two times a day (BID) | ORAL | 3 refills | Status: DC

## 2023-09-13 MED ORDER — HYDROXYZINE HCL 25 MG PO TABS
25.0000 mg | ORAL_TABLET | Freq: Every evening | ORAL | 3 refills | Status: DC | PRN
Start: 2023-09-13 — End: 2023-11-29

## 2023-09-13 MED ORDER — SERTRALINE HCL 50 MG PO TABS: 50.0000 mg | ORAL_TABLET | Freq: Every day | ORAL | 2 refills | Status: DC

## 2023-09-13 MED ORDER — SERTRALINE HCL 100 MG PO TABS: 100.0000 mg | ORAL_TABLET | Freq: Every day | ORAL | 3 refills | Status: DC

## 2023-09-13 NOTE — Progress Notes (Signed)
 [facility] MD/PA/NP OP Progress Note Virtual Visit via Video Note  I connected with Julia Santiago on 09/13/23 at 10:00 AM EST by a video enabled telemedicine application and verified that I am speaking with the correct person using two identifiers.  Location: Patient: Home Provider: Clinic   I discussed the limitations of evaluation and management by telemedicine and the availability of in person appointments. The patient expressed understanding and agreed to proceed.  I provided 30 minutes of non-face-to-face time during this encounter.   09/13/2023 10:24 AM Julia Santiago RISK  MRN:  [id number]  Chief Complaint: "I do a lot for everyone"  HPI: 33 year old female seen today for follow-up psychiatric evaluation.  She has a psychiatric history of anxiety and depression.  She is currently managed on BuSpar 15 milligrams twice daily, Wellbutrin XL 300 mg daily, hydroxyzine 25 mg nightly, and Zoloft 150 mg daily.  She notes her medications are somewhat effective managing her psychiatric condition.  Today she is well-groomed, pleasant, cooperative, engaged in conversation, maintained eye contact.  She informed Clinical research associate that she does a lot for everyone.  Patient notes that she is the primary caregiver for her 3 children.  Recently her grandmother moved in with her and is on hospice.  Patient informed Clinical research associate that she is worried about one of her son who was diagnosed with ADHD.  She notes that she also believes he has autism.  She inform her that she goes into school frequently because of behavior issues.  Patient also notes that her daughter's daycare closed down.  She inform her that she lacks transportation and is worried about finances.  Patient reports the above exacerbates her anxiety and depression.  Today provider conducted a GAD-7 and patient scored a 15, at her last visit she scored a 6.  Provider also conducted PHQ-9 the patient scored an 15, at her last visit she scored a 8.  Patient informed Clinical research associate  that she sleeps approximately 6 hours.  She does note that her appetite has increased and reports that she has gained some weight.  She reports that at her last visit she was 200 pounds and now she is around 209.  Today she denies SI/HI/AVH, mania, paranoia.  Despite the above stressors patient reports that these things are situational and request that her medications not be adjusted.  Provider asked patient if she is interested in therapy but at this time she is not.  No medication changes made today.  Patient is able to continue medications as prescribed.  Patient referred to nutrition and diabetic management for weight loss.  No other concerns noted at this time.  Visit Diagnosis:    ICD-10-CM   1. Well adult exam  Z00.00 Ambulatory referral to Nutrition and Diabetic Education    2. Attention deficit hyperactivity disorder (ADHD), combined type  F90.2 buPROPion (WELLBUTRIN XL) 300 MG 24 hr tablet    3. Major depressive disorder, recurrent episode, moderate (HCC)  F33.1 buPROPion (WELLBUTRIN XL) 300 MG 24 hr tablet    busPIRone (BUSPAR) 15 MG tablet    sertraline (ZOLOFT) 50 MG tablet    sertraline (ZOLOFT) 100 MG tablet    4. GAD (generalized anxiety disorder)  F41.1 busPIRone (BUSPAR) 15 MG tablet    hydrOXYzine (ATARAX) 25 MG tablet    sertraline (ZOLOFT) 50 MG tablet    sertraline (ZOLOFT) 100 MG tablet         Past Psychiatric History: Anxiety and depression  Past Medical History:  Past Medical History:  Diagnosis  Date   Anxiety    Depression    was on certaline for pp depression   Gestational diabetes    Headache     Past Surgical History:  Procedure Laterality Date   CESAREAN SECTION N/A 01/29/2015   Procedure: CESAREAN SECTION;  Surgeon: Brock Bad, MD;  Location: [facility];  Service: Obstetrics;  Laterality: N/A;   CESAREAN SECTION N/A 12/21/2016   Procedure: REPEAT CESAREAN SECTION;  Surgeon: Catalina Antigua, MD;  Location: [facility];  Service:  Obstetrics;  Laterality: N/A;   CESAREAN SECTION WITH BILATERAL TUBAL LIGATION Bilateral 11/02/2020   Procedure: CESAREAN SECTION WITH BILATERAL TUBAL LIGATION;  Surgeon: Federico Flake, MD;  Location: [facility];  Service: Obstetrics;  Laterality: Bilateral;    Family Psychiatric History: Mother anxiety and depression, maternal aunt bipolar disorder, maternal grandmother unknown psychiatric conditions  Family History:  Family History  Problem Relation Age of Onset   Anxiety disorder Mother    Depression Mother    Heart disease Father    Stroke Maternal Grandmother    Arthritis Maternal Grandmother    Heart disease Maternal Grandfather    Alcohol abuse Maternal Grandfather    Cancer Paternal Grandmother     Social History:  Social History   Socioeconomic History   Marital status: Single    Spouse name: Not on file   Number of children: 2   Years of education: Not on file   Highest education level: Not on file  Occupational History   Occupation: UNEMPLOYED  Tobacco Use   Smoking status: Some Days    Types: Cigars   Smokeless tobacco: Never  Vaping Use   Vaping status: Never Used  Substance and Sexual Activity   Alcohol use: Yes    Comment: weekends   Drug use: No   Sexual activity: Not Currently    Birth control/protection: None  Other Topics Concern   Not on file  Social History Narrative   Not on file   Social Drivers of Health   Financial Resource Strain: High Risk (07/17/2021)   Overall Financial Resource Strain (CARDIA)    Difficulty of Paying Living Expenses: Hard  Food Insecurity: Food Insecurity Present (07/17/2021)   Hunger Vital Sign    Worried About Running Out of Food in the Last Year: Sometimes true    Ran Out of Food in the Last Year: Sometimes true  Transportation Needs: No Transportation Needs (07/17/2021)   PRAPARE - Administrator, Civil Service (Medical): No    Lack of Transportation (Non-Medical): No  Physical Activity:  Inactive (07/17/2021)   Exercise Vital Sign    Days of Exercise per Week: 0 days    Minutes of Exercise per Session: 0 min  Stress: Stress Concern Present (07/17/2021)   [employer] of Occupational Health - Occupational Stress Questionnaire    Feeling of Stress : Rather much  Social Connections: Socially Isolated (07/17/2021)   Social Connection and Isolation Panel [NHANES]    Frequency of Communication with Friends and Family: More than three times a week    Frequency of Social Gatherings with Friends and Family: Never    Attends Religious Services: Never    Database administrator or Organizations: No    Attends Banker Meetings: Never    Marital Status: Never married    Allergies:  Allergies  Allergen Reactions   Penicillins     Has patient had a PCN reaction causing immediate rash, facial/tongue/throat swelling, SOB or lightheadedness  with hypotension: No Has patient had a PCN reaction causing severe rash involving mucus membranes or skin necrosis: No Has patient had a PCN reaction that required hospitalization: No Has patient had a PCN reaction occurring within the last 10 years: No If all of the above answers are "NO", then may proceed with Cephalosporin use.  As a child-reaction unknown    Metabolic Disorder Labs: Lab Results  Component Value Date   HGBA1C 5.6 04/26/2020   No results found for: "PROLACTIN" No results found for: "CHOL", "TRIG", "HDL", "CHOLHDL", "VLDL", "LDLCALC" No results found for: "TSH"  Therapeutic Level Labs: No results found for: "LITHIUM" No results found for: "VALPROATE" No results found for: "CBMZ"  Current Medications: Current Outpatient Medications  Medication Sig Dispense Refill   buPROPion (WELLBUTRIN XL) 300 MG 24 hr tablet Take 1 tablet (300 mg total) by mouth every morning. 300 tablet 3   busPIRone (BUSPAR) 15 MG tablet Take 1 tablet (15 mg total) by mouth 2 (two) times daily. 60 tablet 3   hydrOXYzine (ATARAX) 25 MG  tablet Take 1 tablet (25 mg total) by mouth at bedtime as needed for anxiety (sleep). 30 tablet 3   predniSONE (DELTASONE) 5 MG tablet Take 5 mg by mouth 4 (four) times daily - after meals and at bedtime.     Prenatal Vit-Fe Fumarate-FA (PRENATAL VITAMINS) 28-0.8 MG TABS Take 1 tablet by mouth daily. 60 tablet 3   sertraline (ZOLOFT) 100 MG tablet Take 1 tablet (100 mg total) by mouth daily. 30 tablet 3   sertraline (ZOLOFT) 50 MG tablet Take 1 tablet (50 mg total) by mouth daily. 90 tablet 2   No current facility-administered medications for this visit.     Musculoskeletal: Strength & Muscle Tone: within normal limits and telehealth visit Gait & Station: normal, telehealth visit Patient leans: N/A  Psychiatric Specialty Exam: Review of Systems  unknown if currently breastfeeding.There is no height or weight on file to calculate BMI.  General Appearance: Well Groomed  Eye Contact:  Good  Speech:  Clear and Coherent and Normal Rate  Volume:  Normal  Mood:  Anxious and Depressed  Affect:  Appropriate and Congruent  Thought Process:  Coherent, Goal Directed, and Linear  Orientation:  Full (Time, Place, and Person)  Thought Content: WDL and Logical   Suicidal Thoughts:  No  Homicidal Thoughts:  No  Memory:  Immediate;   Good Recent;   Good Remote;   Good  Judgement:  Good  Insight:  Good  Psychomotor Activity:  Normal  Concentration:  Concentration: Good and Attention Span: Good  Recall:  Good  Fund of Knowledge: Good  Language: Good  Akathisia:  No  Handed:  Right  AIMS (if indicated): not done  Assets:  Communication Skills Desire for Improvement Financial Resources/Insurance Housing Physical Health Social Support  ADL's:  Intact  Cognition: WNL  Sleep:  Good   Screenings: AUDIT    Flowsheet Row Video Visit from 06/06/2022 in [facility] Counselor from [facility]/17/2021 in [facility]  Alcohol Use Disorder  Identification Test Final Score (AUDIT) 13 9      GAD-7    Flowsheet Row Video Visit from 09/13/2023 in [facility] Video Visit from 07/05/2023 in [facility] Video Visit from 04/03/2023 in [facility] Counselor from 10/10/2022 in [facility] Video Visit from 08/27/2022 in [facility]  Total GAD-7 Score 15  6 9 3 12       PHQ2-9    Flowsheet Row Video Visit from 09/13/2023 in [facility] Video Visit from 07/05/2023 in [facility] Video Visit from 04/03/2023 in [facility]or from 10/10/2022 in [facility] Video Visit from 08/27/2022 in [facility]  PHQ-2 Total Score 3 1 2 1 3   PHQ-9 Total Score 15 8 12 6 18       Flowsheet Row ED from 09/23/2022 in [facility] Emergency Department at [facility] ED from 09/15/2021 in [facility] Emergency Department at [facility] Counselor from 07/17/2021 in [facility]  C-SSRS RISK CATEGORY No Risk No Risk No Risk        Assessment and Plan: Patient reports that her anxiety and depression has worsened since her last visit due to situational, financial, and family stressors.   Despite the above stressors patient reports that these things are situational and request that her medications not be adjusted.  Provider asked patient if she is interested in therapy but at this time she is not.  No medication changes made today.  Patient is able to continue medications as prescribed.  Patient referred to nutrition and diabetic management for weight loss.  1. Attention deficit hyperactivity disorder (ADHD), combined type  Continue- buPROPion (WELLBUTRIN XL) 300 MG 24 hr tablet; Take 1 tablet (300 mg total) by mouth every morning.  Dispense: 300  tablet; Refill: 3  2. Major depressive disorder, recurrent episode, moderate (HCC)  Continue- busPIRone (BUSPAR) 15 MG tablet; Take 1 tablet (15 mg total) by mouth 2 (two) times daily.  Dispense: 60 tablet; Refill: 3 Continue- sertraline (ZOLOFT) 50 MG tablet; Take 1 tablet (50 mg total) by mouth daily.  Dispense: 30 tablet; Refill: 3 Continue- sertraline (ZOLOFT) 100 MG tablet; Take 1 tablet (100 mg total) by mouth daily.  Dispense: 30 tablet; Refill: 3 Increased- buPROPion (WELLBUTRIN XL) 300 MG 24 hr tablet; Take 1 tablet (300 mg total) by mouth every morning.  Dispense: 300 tablet; Refill: 3  3. GAD (generalized anxiety disorder)  Continue- busPIRone (BUSPAR) 15 MG tablet; Take 1 tablet (15 mg total) by mouth 2 (two) times daily.  Dispense: 60 tablet; Refill: 3 Continue- hydrOXYzine (ATARAX) 25 MG tablet; Take 1 tablet (25 mg total) by mouth at bedtime as needed for anxiety (sleep).  Dispense: 30 tablet; Refill: 3 Continue- sertraline (ZOLOFT) 50 MG tablet; Take 1 tablet (50 mg total) by mouth daily.  Dispense: 30 tablet; Refill: 3 Continue- sertraline (ZOLOFT) 100 MG tablet; Take 1 tablet (100 mg total) by mouth daily.  Dispense: 30 tablet; Refill: 3  Follow-up in 2.5 months Follow-up with therapy  Shanna Cisco, NP 09/13/2023, 10:24 AM

## 2023-11-29 ENCOUNTER — Telehealth (INDEPENDENT_AMBULATORY_CARE_PROVIDER_SITE_OTHER): Admitting: Psychiatry

## 2023-11-29 ENCOUNTER — Encounter (HOSPITAL_COMMUNITY): Payer: Self-pay | Admitting: Psychiatry

## 2023-11-29 DIAGNOSIS — F411 Generalized anxiety disorder: Secondary | ICD-10-CM

## 2023-11-29 DIAGNOSIS — F331 Major depressive disorder, recurrent, moderate: Secondary | ICD-10-CM | POA: Diagnosis not present

## 2023-11-29 DIAGNOSIS — F902 Attention-deficit hyperactivity disorder, combined type: Secondary | ICD-10-CM | POA: Diagnosis not present

## 2023-11-29 MED ORDER — BUPROPION HCL ER (XL) 150 MG PO TB24: 300.0000 mg | ORAL_TABLET | ORAL | 3 refills | Status: DC

## 2023-11-29 MED ORDER — SERTRALINE HCL 100 MG PO TABS: 200.0000 mg | ORAL_TABLET | Freq: Every day | ORAL | 3 refills | Status: DC

## 2023-11-29 MED ORDER — HYDROXYZINE HCL 25 MG PO TABS: 25.0000 mg | ORAL_TABLET | Freq: Every evening | ORAL | 3 refills | Status: DC | PRN

## 2023-11-29 MED ORDER — BUSPIRONE HCL 15 MG PO TABS: 15.0000 mg | ORAL_TABLET | Freq: Two times a day (BID) | ORAL | 3 refills | Status: DC

## 2023-11-29 NOTE — Progress Notes (Signed)
 [facility] MD/PA/NP OP Progress Note Virtual Visit via Video Note  I connected with Julia Santiago on 11/29/23 at 10:30 AM EDT by a video enabled telemedicine application and verified that I am speaking with the correct person using two identifiers.  Location: Patient: Home Provider: Clinic   I discussed the limitations of evaluation and management by telemedicine and the availability of in person appointments. The patient expressed understanding and agreed to proceed.  I provided 30 minutes of non-face-to-face time during this encounter.   11/29/2023 10:57 AM Julia Santiago  MRN:  [id number]  Chief Complaint: "I think the Wellbutrin  is to much for me"  HPI: 33 year old female seen today for follow-up psychiatric evaluation.  She has a psychiatric history of anxiety and depression.  She is currently managed on BuSpar  15 milligrams twice daily, Wellbutrin  XL 300 mg daily, hydroxyzine  25 mg nightly, and Zoloft  150 mg daily.  She notes her medications are somewhat effective managing her psychiatric condition.  Today she is well-groomed, pleasant, cooperative, engaged in conversation, maintained eye contact.  She informed Clinical research associate that she believes Wellbutrin  maybe to much for her. She notes that she feels that it dulls her emotions. Patient notes that in April her grandmother passed and she was unable to express her emotions. She also notes that her sleep and appetite fluctuates and notes that she believes its caused by Wellbutrin .  Provider informed patient that Wellbutrin  at times decreases appetite and notes that it does cause mental alertness.  She notes that she likes that it makes her more alert as it helps her with her ADHD.  She notes that she does not wish to discontinue it but to lower it.   Since her last visit she notes that her anxiety and depression has somewhat improved.  Today provider conducted GAD-7 and patient scored a 9, at her last visit she scored a 15.   Provider also conducted  PHQ-9 the patient scored an 15, at her last visit she scored a 15.since her last visit she reports that she has gained 13 pounds and now wound is approximately 222.  Today she denies SI/HI/AVH, mania, paranoia.  Today Wellbutrin  XL 300 mg reduced to 150 mg.  Zoloft  increased from 150 mg to 200 mg to help manage anxiety and depression.  Provider informed patient that if antidepressants are not managing her depression and anxiety BuSpar  can be increased or an antipsychotic such as Rexulti or Abilify can be trialed at her next visit.  She endorsed understanding and agreed.  Patient will continue all other medications as prescribed.   No other concerns noted at this time.  Visit Diagnosis:    ICD-10-CM   1. Major depressive disorder, recurrent episode, moderate (HCC)  F33.1 sertraline  (ZOLOFT ) 100 MG tablet    busPIRone  (BUSPAR ) 15 MG tablet    buPROPion  (WELLBUTRIN  XL) 150 MG 24 hr tablet    2. GAD (generalized anxiety disorder)  F41.1 sertraline  (ZOLOFT ) 100 MG tablet    busPIRone  (BUSPAR ) 15 MG tablet    hydrOXYzine  (ATARAX ) 25 MG tablet    3. Attention deficit hyperactivity disorder (ADHD), combined type  F90.2 buPROPion  (WELLBUTRIN  XL) 150 MG 24 hr tablet          Past Psychiatric History: Anxiety and depression  Past Medical History:  Past Medical History:  Diagnosis Date   Anxiety    Depression    was on certaline for pp depression   Gestational diabetes    Headache     Past Surgical  History:  Procedure Laterality Date   CESAREAN SECTION N/A 01/29/2015   Procedure: CESAREAN SECTION;  Surgeon: Gabrielle Joiner, MD;  Location: [facility];  Service: Obstetrics;  Laterality: N/A;   CESAREAN SECTION N/A 12/21/2016   Procedure: REPEAT CESAREAN SECTION;  Surgeon: Verlyn Goad, MD;  Location: [facility];  Service: Obstetrics;  Laterality: N/A;   CESAREAN SECTION WITH BILATERAL TUBAL LIGATION Bilateral 11/02/2020   Procedure: CESAREAN SECTION WITH BILATERAL TUBAL LIGATION;   Surgeon: Abner Ables, MD;  Location: [facility];  Service: Obstetrics;  Laterality: Bilateral;    Family Psychiatric History: Mother anxiety and depression, maternal aunt bipolar disorder, maternal grandmother unknown psychiatric conditions  Family History:  Family History  Problem Relation Age of Onset   Anxiety disorder Mother    Depression Mother    Heart disease Father    Stroke Maternal Grandmother    Arthritis Maternal Grandmother    Heart disease Maternal Grandfather    Alcohol abuse Maternal Grandfather    Cancer Paternal Grandmother     Social History:  Social History   Socioeconomic History   Marital status: Single    Spouse name: Not on file   Number of children: 2   Years of education: Not on file   Highest education level: Not on file  Occupational History   Occupation: UNEMPLOYED  Tobacco Use   Smoking status: Some Days    Types: Cigars   Smokeless tobacco: Never  Vaping Use   Vaping status: Never Used  Substance and Sexual Activity   Alcohol use: Yes    Comment: weekends   Drug use: No   Sexual activity: Not Currently    Birth control/protection: None  Other Topics Concern   Not on file  Social History Narrative   Not on file   Social Drivers of Health   Financial Resource Strain: High Risk (07/17/2021)   Overall Financial Resource Strain (CARDIA)    Difficulty of Paying Living Expenses: Hard  Food Insecurity: Food Insecurity Present (07/17/2021)   Hunger Vital Sign    Worried About Running Out of Food in the Last Year: Sometimes true    Ran Out of Food in the Last Year: Sometimes true  Transportation Needs: No Transportation Needs (07/17/2021)   PRAPARE - Administrator, Civil Service (Medical): No    Lack of Transportation (Non-Medical): No  Physical Activity: Inactive (07/17/2021)   Exercise Vital Sign    Days of Exercise per Week: 0 days    Minutes of Exercise per Session: 0 min  Stress: Stress Concern Present (07/17/2021)    [employer] of Occupational Health - Occupational Stress Questionnaire    Feeling of Stress : Rather much  Social Connections: Socially Isolated (07/17/2021)   Social Connection and Isolation Panel [NHANES]    Frequency of Communication with Friends and Family: More than three times a week    Frequency of Social Gatherings with Friends and Family: Never    Attends Religious Services: Never    Database administrator or Organizations: No    Attends Banker Meetings: Never    Marital Status: Never married    Allergies:  Allergies  Allergen Reactions   Penicillins     Has patient had a PCN reaction causing immediate rash, facial/tongue/throat swelling, SOB or lightheadedness with hypotension: No Has patient had a PCN reaction causing severe rash involving mucus membranes or skin necrosis: No Has patient had a PCN reaction that required hospitalization: No Has patient  had a PCN reaction occurring within the last 10 years: No If all of the above answers are "NO", then may proceed with Cephalosporin use.  As a child-reaction unknown    Metabolic Disorder Labs: Lab Results  Component Value Date   HGBA1C 5.6 04/26/2020   No results found for: "PROLACTIN" No results found for: "CHOL", "TRIG", "HDL", "CHOLHDL", "VLDL", "LDLCALC" No results found for: "TSH"  Therapeutic Level Labs: No results found for: "LITHIUM" No results found for: "VALPROATE" No results found for: "CBMZ"  Current Medications: Current Outpatient Medications  Medication Sig Dispense Refill   buPROPion  (WELLBUTRIN  XL) 150 MG 24 hr tablet Take 2 tablets (300 mg total) by mouth every morning. 30 tablet 3   busPIRone  (BUSPAR ) 15 MG tablet Take 1 tablet (15 mg total) by mouth 2 (two) times daily. 60 tablet 3   hydrOXYzine  (ATARAX ) 25 MG tablet Take 1 tablet (25 mg total) by mouth at bedtime as needed for anxiety (sleep). 30 tablet 3   predniSONE (DELTASONE) 5 MG tablet Take 5 mg by mouth 4 (four)  times daily - after meals and at bedtime.     Prenatal Vit-Fe Fumarate-FA (PRENATAL VITAMINS) 28-0.8 MG TABS Take 1 tablet by mouth daily. 60 tablet 3   sertraline  (ZOLOFT ) 100 MG tablet Take 2 tablets (200 mg total) by mouth daily. 60 tablet 3   No current facility-administered medications for this visit.     Musculoskeletal: Strength & Muscle Tone: within normal limits and telehealth visit Gait & Station: normal, telehealth visit Patient leans: N/A  Psychiatric Specialty Exam: Review of Systems  unknown if currently breastfeeding.There is no height or weight on file to calculate BMI.  General Appearance: Well Groomed  Eye Contact:  Good  Speech:  Clear and Coherent and Normal Rate  Volume:  Normal  Mood:  Anxious and Depressed, improving  Affect:  Appropriate and Congruent  Thought Process:  Coherent, Goal Directed, and Linear  Orientation:  Full (Time, Place, and Person)  Thought Content: WDL and Logical   Suicidal Thoughts:  No  Homicidal Thoughts:  No  Memory:  Immediate;   Good Recent;   Good Remote;   Good  Judgement:  Good  Insight:  Good  Psychomotor Activity:  Normal  Concentration:  Concentration: Good and Attention Span: Good  Recall:  Good  Fund of Knowledge: Good  Language: Good  Akathisia:  No  Handed:  Right  AIMS (if indicated): not done  Assets:  Communication Skills Desire for Improvement Financial Resources/Insurance Housing Physical Health Social Support  ADL's:  Intact  Cognition: WNL  Sleep:  Fair   Screenings: AUDIT    Flowsheet Row Video Visit from 06/06/2022 in [facility] Counselor from 07/17/2021 in [facility]  Alcohol Use Disorder Identification Test Final Score (AUDIT) 13 9      GAD-7    Flowsheet Row Video Visit from 11/29/2023 in [facility] Video Visit from 09/13/2023 in [facility] Video Visit from 07/05/2023 in  [facility] Video Visit from 04/03/2023 in [facility] Counselor from 10/10/2022 in [facility]  Total GAD-7 Score 9 15 6 9 3       [id number]    Flowsheet Row Video Visit from 11/29/2023 in [facility] Video Visit from 09/13/2023 in [facility] Video Visit from 07/05/2023 in [facility] Video Visit from 04/03/2023  in [facility] Counselor from 10/10/2022 in [place]  PHQ-2 Total Score 4 3 1 2 1   PHQ-9 Total Score 15 15 8 12 6       Flowsheet Row ED from 09/23/2022 in [facility] at [facility] ED from 09/15/2021 in [facility] Department at [facility] Counselor from 07/17/2021 in [facility]  C-SSRS RISK CATEGORY No Risk No Risk No Risk        Assessment and Plan: Patient reports that her anxiety and depression has worsened since her last visit. She reports that she finds that Wellbutrin  dulls her emotion, impacts her sleep, and appetite.Today Wellbutrin  XL 300 mg reduced to 150 mg.  Zoloft  increased from 150 mg to 200 mg to help manage anxiety and depression.  Provider informed patient that if antidepressants are not managing her depression and anxiety BuSpar  can be increased or an antipsychotic such as Rexulti or Abilify can be trialed at her next visit.  She endorsed understanding and agreed.  Patient will continue all other medications as prescribed.    1. Major depressive disorder, recurrent episode, moderate (HCC)  Continue- sertraline  (ZOLOFT ) 100 MG tablet; Take 2 tablets (200 mg total) by mouth daily.  Dispense: 60 tablet; Refill: 3 Continue- busPIRone  (BUSPAR ) 15 MG tablet; Take 1 tablet (15 mg total) by mouth 2 (two) times daily.  Dispense: 60 tablet; Refill: 3 decreased- buPROPion   (WELLBUTRIN  XL) 150 MG 24 hr tablet; Take 2 tablets (300 mg total) by mouth every morning.  Dispense: 30 tablet; Refill: 3  2. GAD (generalized anxiety disorder)  Continue- sertraline  (ZOLOFT ) 100 MG tablet; Take 2 tablets (200 mg total) by mouth daily.  Dispense: 60 tablet; Refill: 3 Continue- busPIRone  (BUSPAR ) 15 MG tablet; Take 1 tablet (15 mg total) by mouth 2 (two) times daily.  Dispense: 60 tablet; Refill: 3 Continue- hydrOXYzine  (ATARAX ) 25 MG tablet; Take 1 tablet (25 mg total) by mouth at bedtime as needed for anxiety (sleep).  Dispense: 30 tablet; Refill: 3  3. Attention deficit hyperactivity disorder (ADHD), combined type  Decreased- buPROPion  (WELLBUTRIN  XL) 150 MG 24 hr tablet; Take 2 tablets (300 mg total) by mouth every morning.  Dispense: 30 tablet; Refill: 3   Follow-up in 2.5 months Follow-up with therapy  Arlyne Bering, NP 11/29/2023, 10:57 AM

## 2023-12-25 IMAGING — CR DG CHEST 2V
2 series · 2 of 2 positions shown · non-contrast
Comparison: None.

CLINICAL DATA: Chest pain

EXAM:
CHEST - 2 VIEW

[chest pa]
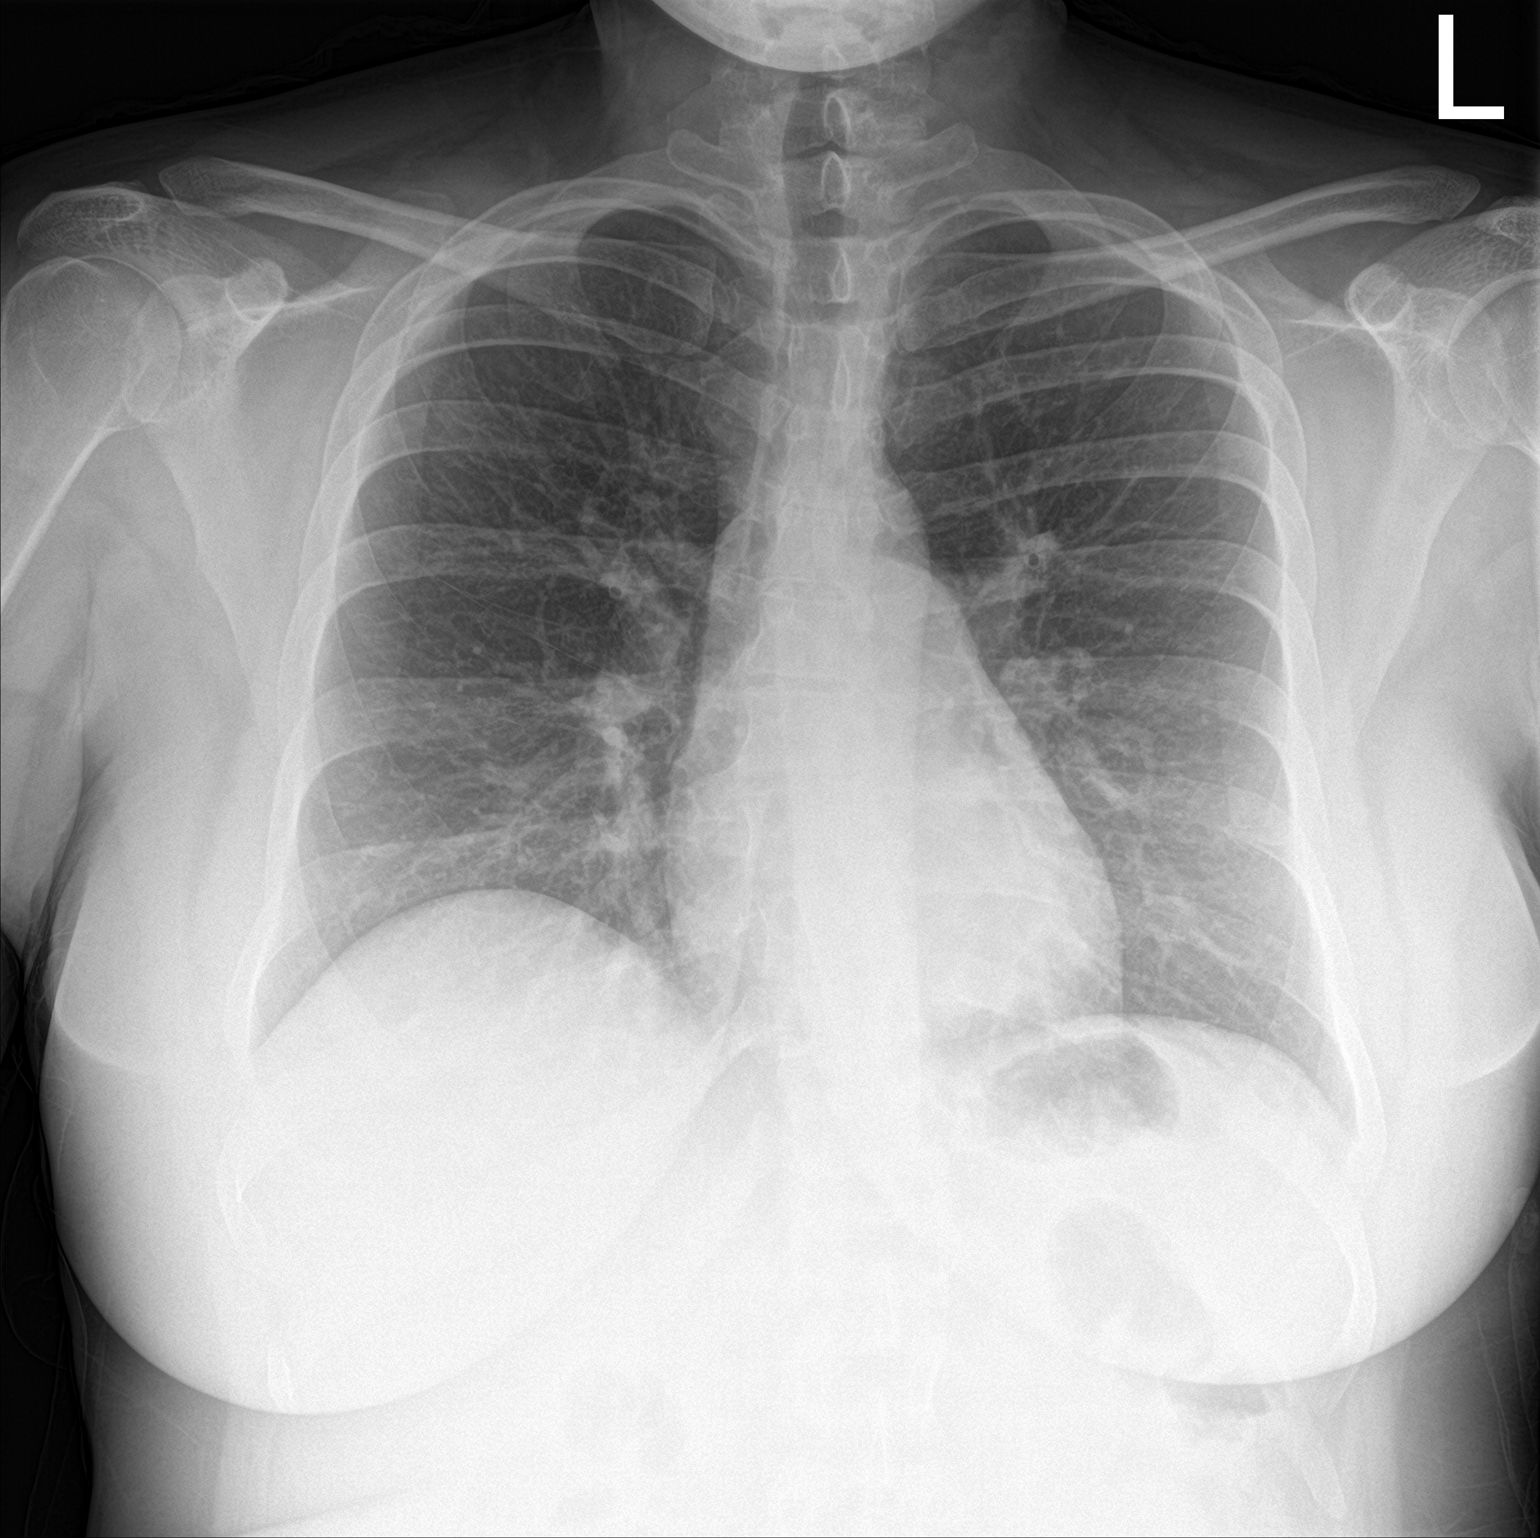

[chest lat]
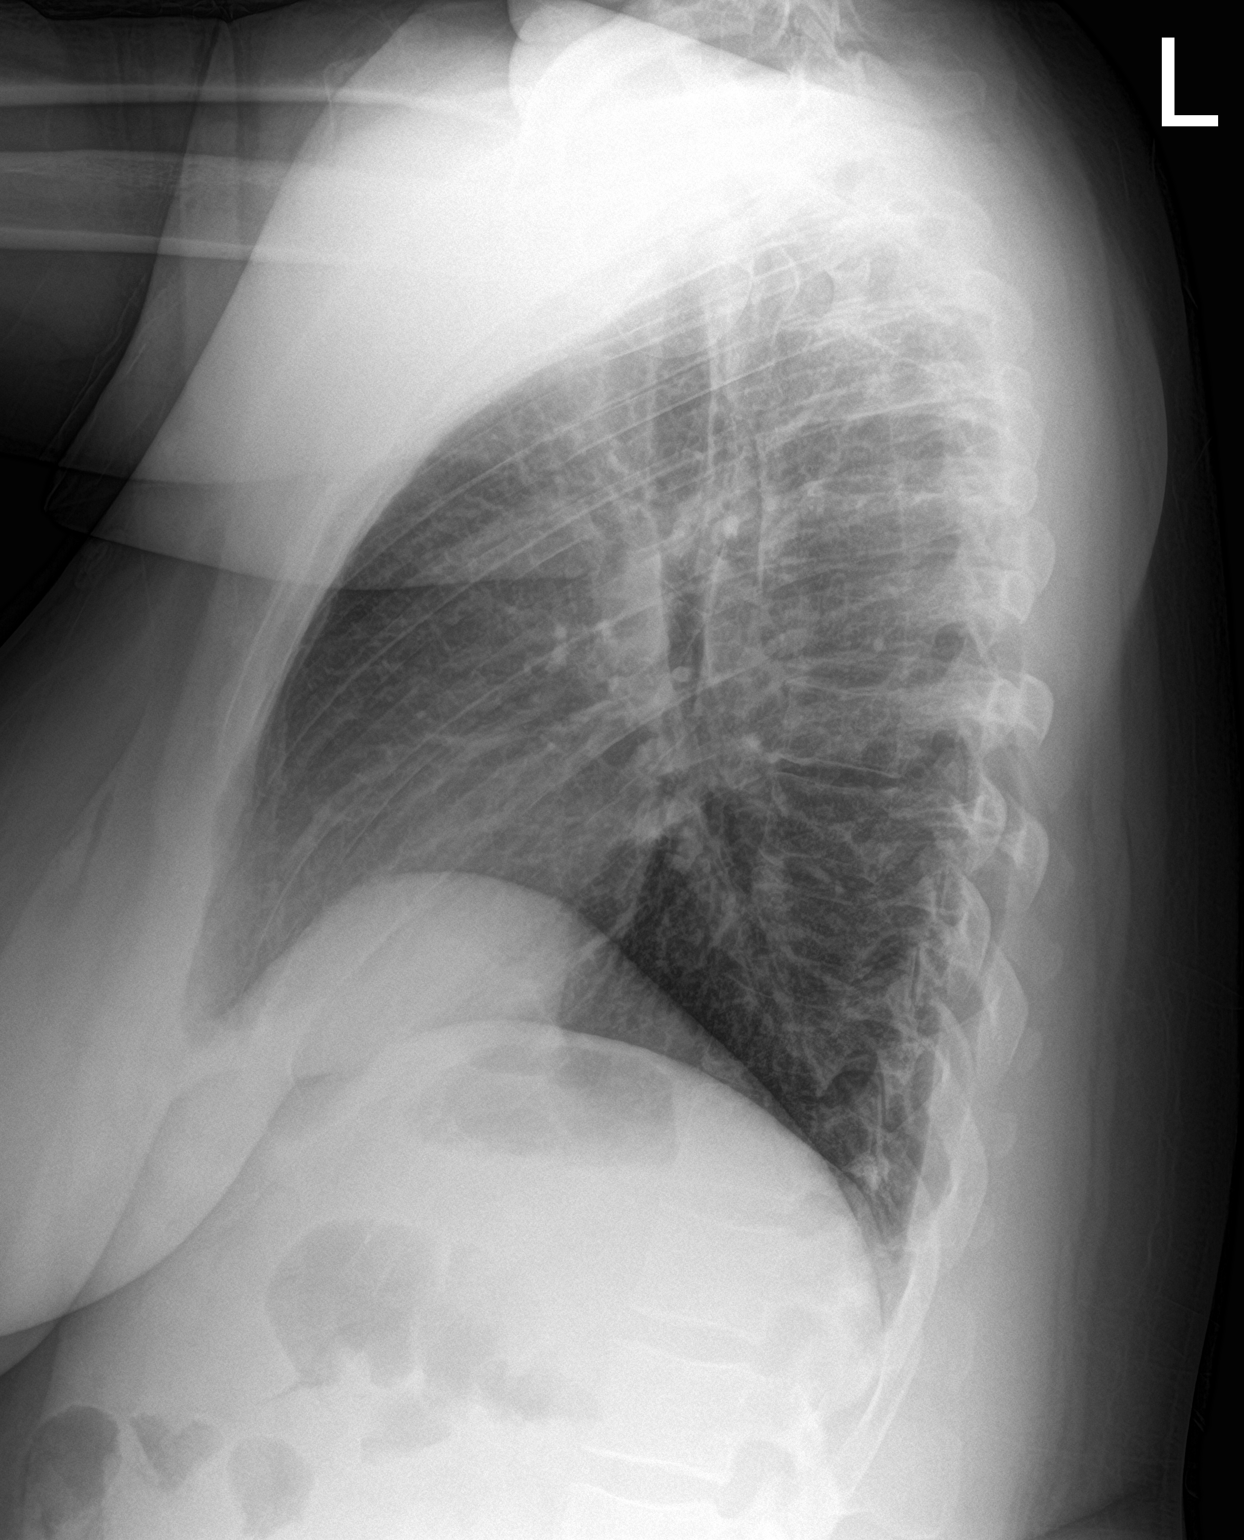

[2 of 2 positions shown; findings below may reference images not displayed]

FINDINGS: The heart size and mediastinal contours are within normal limits.
Both lungs are clear. The visualized skeletal structures are
unremarkable.
IMPRESSION: No active cardiopulmonary disease.

## 2024-02-07 ENCOUNTER — Telehealth (HOSPITAL_COMMUNITY): Admitting: Psychiatry

## 2024-02-14 ENCOUNTER — Encounter (HOSPITAL_COMMUNITY): Payer: Self-pay

## 2024-02-14 ENCOUNTER — Telehealth (HOSPITAL_COMMUNITY): Admitting: Family

## 2024-02-21 ENCOUNTER — Telehealth (INDEPENDENT_AMBULATORY_CARE_PROVIDER_SITE_OTHER): Admitting: Family

## 2024-02-21 ENCOUNTER — Encounter (HOSPITAL_COMMUNITY): Payer: Self-pay | Admitting: Family

## 2024-02-21 DIAGNOSIS — F902 Attention-deficit hyperactivity disorder, combined type: Secondary | ICD-10-CM

## 2024-02-21 DIAGNOSIS — F411 Generalized anxiety disorder: Secondary | ICD-10-CM | POA: Diagnosis not present

## 2024-02-21 DIAGNOSIS — F331 Major depressive disorder, recurrent, moderate: Secondary | ICD-10-CM

## 2024-02-21 MED ORDER — SERTRALINE HCL 50 MG PO TABS: 100.0000 mg | ORAL_TABLET | Freq: Every day | ORAL | 0 refills | Status: DC

## 2024-02-21 MED ORDER — BUPROPION HCL ER (XL) 150 MG PO TB24: 150.0000 mg | ORAL_TABLET | ORAL | 0 refills | Status: DC

## 2024-02-21 MED ORDER — HYDROXYZINE HCL 25 MG PO TABS: 25.0000 mg | ORAL_TABLET | Freq: Every evening | ORAL | 3 refills | Status: DC | PRN

## 2024-02-21 MED ORDER — BUSPIRONE HCL 15 MG PO TABS: 15.0000 mg | ORAL_TABLET | Freq: Two times a day (BID) | ORAL | 3 refills | Status: DC

## 2024-02-21 NOTE — Progress Notes (Signed)
 [facility] MD/PA/NP OP Progress Note Virtual Visit via Video Note  I connected with Julia Santiago on 02/21/24 at  8:30 AM EDT by a video enabled telemedicine application and verified that I am speaking with the correct person using two identifiers.  Location: Patient: Home Provider: Clinic   I discussed the limitations of evaluation and management by telemedicine and the availability of in person appointments. The patient expressed understanding and agreed to proceed.  I provided 30 minutes of non-face-to-face time during this encounter.   02/21/2024 9:41 AM Julia Santiago  MRN:  [id number]  Chief Complaint: I think the Wellbutrin is to much for me  HPI: Patient presents for follow-up on anxiety, depression, and ADHD management. She reports being well-groomed, pleasant, cooperative, engaged in conversation, and maintaining good eye contact throughout the visit. Since her last appointment, she notes mild improvement in anxiety and depression. She unintentionally discontinued her medications due to difficulty obtaining refills while her provider was on maternity leave, and she noticed a decline in her mood and functioning during that time. She associates Wellbutrin with increased mental alertness, which she finds helpful for ADHD symptoms, and does not wish to discontinue but prefers a lower dose. She reports weight gain since discontinuing Wellbutrin, which she believes is related. She endorses some depressive symptoms, including fatigue, low motivation, intermittent insomnia (sleeping a maximum of six hours), and occasional task paralysis. She attributes part of her emotional distress to unresolved grief and medical trauma related to her grandmother's passing, which strained family relationships.  Subjective: Patient states her medications have been beneficial, and she is eager to resume her regimen. She describes episodes of sensory overload, feeling drained one day and hyper-productive the  next, followed by the need to rest despite pending tasks. She denies suicidal ideation, homicidal ideation, auditory or visual hallucinations, mania, or paranoia.  Objective: Patient is alert and oriented 4, well-groomed, and dressed appropriately. Maintains good eye contact and is cooperative throughout the interview. Speech is clear, coherent, and of normal rate and volume. Mood appears mildly depressed but stable; affect is congruent. Thought process is logical and goal-directed. No perceptual disturbances are observed. Insight and judgment are fair.  Visit Diagnosis:    ICD-10-CM   1. Major depressive disorder, recurrent episode, moderate (HCC)  F33.1 buPROPion (WELLBUTRIN XL) 150 MG 24 hr tablet    busPIRone (BUSPAR) 15 MG tablet    sertraline (ZOLOFT) 50 MG tablet    2. Attention deficit hyperactivity disorder (ADHD), combined type  F90.2 buPROPion (WELLBUTRIN XL) 150 MG 24 hr tablet    3. GAD (generalized anxiety disorder)  F41.1 busPIRone (BUSPAR) 15 MG tablet    sertraline (ZOLOFT) 50 MG tablet    hydrOXYzine (ATARAX) 25 MG tablet           Past Psychiatric History: Anxiety and depression  Past Medical History:  Past Medical History:  Diagnosis Date   Anxiety    Depression    was on certaline for pp depression   Gestational diabetes    Headache     Past Surgical History:  Procedure Laterality Date   CESAREAN SECTION N/A 01/29/2015   Procedure: CESAREAN SECTION;  Surgeon: Carlin DELENA Centers, MD;  Location: [facility];  Service: Obstetrics;  Laterality: N/A;   CESAREAN SECTION N/A 12/21/2016   Procedure: REPEAT CESAREAN SECTION;  Surgeon: Alger Gong, MD;  Location: [facility];  Service: Obstetrics;  Laterality: N/A;   CESAREAN SECTION WITH BILATERAL TUBAL LIGATION Bilateral 11/02/2020   Procedure: CESAREAN SECTION  WITH BILATERAL TUBAL LIGATION;  Surgeon: Eldonna Suzen Octave, MD;  Location: [facility];  Service: Obstetrics;  Laterality: Bilateral;    Family  Psychiatric History: Mother anxiety and depression, maternal aunt bipolar disorder, maternal grandmother unknown psychiatric conditions  Family History:  Family History  Problem Relation Age of Onset   Anxiety disorder Mother    Depression Mother    Heart disease Father    Stroke Maternal Grandmother    Arthritis Maternal Grandmother    Heart disease Maternal Grandfather    Alcohol abuse Maternal Grandfather    Cancer Paternal Grandmother     Social History:  Social History   Socioeconomic History   Marital status: Single    Spouse name: Not on file   Number of children: 2   Years of education: Not on file   Highest education level: Not on file  Occupational History   Occupation: UNEMPLOYED  Tobacco Use   Smoking status: Some Days    Types: Cigars   Smokeless tobacco: Never  Vaping Use   Vaping status: Never Used  Substance and Sexual Activity   Alcohol use: Yes    Comment: weekends   Drug use: No   Sexual activity: Not Currently    Birth control/protection: None  Other Topics Concern   Not on file  Social History Narrative   Not on file   Social Drivers of Health   Financial Resource Strain: High Risk (07/17/2021)   Overall Financial Resource Strain (CARDIA)    Difficulty of Paying Living Expenses: Hard  Food Insecurity: Food Insecurity Present (07/17/2021)   Hunger Vital Sign    Worried About Running Out of Food in the Last Year: Sometimes true    Ran Out of Food in the Last Year: Sometimes true  Transportation Needs: No Transportation Needs (07/17/2021)   PRAPARE - Administrator, Civil Service (Medical): No    Lack of Transportation (Non-Medical): No  Physical Activity: Inactive (07/17/2021)   Exercise Vital Sign    Days of Exercise per Week: 0 days    Minutes of Exercise per Session: 0 min  Stress: Stress Concern Present (07/17/2021)   [employer] of Occupational Health - Occupational Stress Questionnaire    Feeling of Stress : Rather much   Social Connections: Socially Isolated (07/17/2021)   Social Connection and Isolation Panel    Frequency of Communication with Friends and Family: More than three times a week    Frequency of Social Gatherings with Friends and Family: Never    Attends Religious Services: Never    Database administrator or Organizations: No    Attends Banker Meetings: Never    Marital Status: Never married    Allergies:  Allergies  Allergen Reactions   Penicillins     Has patient had a PCN reaction causing immediate rash, facial/tongue/throat swelling, SOB or lightheadedness with hypotension: No Has patient had a PCN reaction causing severe rash involving mucus membranes or skin necrosis: No Has patient had a PCN reaction that required hospitalization: No Has patient had a PCN reaction occurring within the last 10 years: No If all of the above answers are NO, then may proceed with Cephalosporin use.  As a child-reaction unknown    Metabolic Disorder Labs: Lab Results  Component Value Date   HGBA1C 5.6 04/26/2020   No results found for: PROLACTIN No results found for: CHOL, TRIG, HDL, CHOLHDL, VLDL, LDLCALC No results found for: TSH  Therapeutic Level Labs: No results found  for: LITHIUM No results found for: VALPROATE No results found for: CBMZ  Current Medications: Current Outpatient Medications  Medication Sig Dispense Refill   buPROPion (WELLBUTRIN XL) 150 MG 24 hr tablet Take 2 tablets (300 mg total) by mouth every morning. 30 tablet 3   busPIRone (BUSPAR) 15 MG tablet Take 1 tablet (15 mg total) by mouth 2 (two) times daily. 60 tablet 3   hydrOXYzine (ATARAX) 25 MG tablet Take 1 tablet (25 mg total) by mouth at bedtime as needed for anxiety (sleep). 30 tablet 3   predniSONE (DELTASONE) 5 MG tablet Take 5 mg by mouth 4 (four) times daily - after meals and at bedtime.     Prenatal Vit-Fe Fumarate-FA (PRENATAL VITAMINS) 28-0.8 MG TABS Take 1 tablet by  mouth daily. 60 tablet 3   sertraline (ZOLOFT) 100 MG tablet Take 2 tablets (200 mg total) by mouth daily. 60 tablet 3   No current facility-administered medications for this visit.     Musculoskeletal: Strength & Muscle Tone: within normal limits and telehealth visit Gait & Station: normal, telehealth visit Patient leans: N/A  Psychiatric Specialty Exam: Review of Systems  Constitutional:  Positive for unexpected weight change.  Psychiatric/Behavioral:  Positive for decreased concentration and sleep disturbance. The patient is nervous/anxious.   All other systems reviewed and are negative.   unknown if currently breastfeeding.There is no height or weight on file to calculate BMI.  General Appearance: Well Groomed  Eye Contact:  Good  Speech:  Clear and Coherent and Normal Rate  Volume:  Normal  Mood:  Anxious and Depressed, improving  Affect:  Appropriate and Congruent  Thought Process:  Coherent, Goal Directed, and Linear  Orientation:  Full (Time, Place, and Person)  Thought Content: WDL and Logical   Suicidal Thoughts:  No  Homicidal Thoughts:  No  Memory:  Immediate;   Good Recent;   Good Remote;   Good  Judgement:  Good  Insight:  Good  Psychomotor Activity:  Normal  Concentration:  Concentration: Good and Attention Span: Good  Recall:  Good  Fund of Knowledge: Good  Language: Good  Akathisia:  No  Handed:  Right  AIMS (if indicated): not done  Assets:  Communication Skills Desire for Improvement Financial Resources/Insurance Housing Physical Health Social Support  ADL's:  Intact  Cognition: WNL  Sleep:  Fair   Screenings: AUDIT    Flowsheet Row Video Visit from 06/06/2022 in [facility] from 07/17/2021 in [facility]  Alcohol Use Disorder Identification Test Final Score (AUDIT) 13 9   GAD-7    Flowsheet Row Video Visit from 11/29/2023 in [facility] Video  Visit from 09/13/2023 in [facility] Video Visit from 07/05/2023 in [facility] Video Visit from 04/03/2023 in [facility] from 10/10/2022 in [facility]  Total GAD-7 Score 9 15 6 9 3    PHQ2-9    Flowsheet Row Video Visit from 11/29/2023 in [facility] Video Visit from 09/13/2023 in [facility] Video Visit from 07/05/2023 in [facility] Video Visit from 04/03/2023 in [facility] from 10/10/2022 in [facility]  PHQ-2 Total Score 4 3 1 2 1   PHQ-9 Total Score 15 15 8 12 6    Flowsheet Row ED from 09/23/2022 in [facility] at [facility] ED from  09/15/2021 in [facility] Department at [facility] from 07/17/2021 in [facility]  C-SSRS RISK CATEGORY No Risk No Risk No Risk     Assessment and Plan: The patient is a female with a history of major depressive disorder, generalized anxiety disorder, ADHD, and unresolved grief related to the death of her grandmother, who presents with mild improvement in mood and anxiety since her last visit. She has been without medication for a period due to refill access issues, during which she noted increased fatigue, low motivation, intermittent insomnia, and weight gain. Wellbutrin has been helpful for her ADHD symptoms by improving alertness but has contributed to appetite suppression; she prefers to continue at a lower dose. She reports stable mood overall with no current suicidal ideation, homicidal ideation, hallucinations, mania, or paranoia. Psychosocial stressors include strained family relationships stemming from bereavement and medical trauma. Current presentation is consistent with partially controlled depression and  anxiety in the context of medication interruption, with some residual symptoms and functional impact. Medication Adjustments:  Resume Wellbutrin XL 150 mg daily (lower dose to help manage alertness while minimizing appetite suppression).  Restart Zoloft 50 mg daily for 2 weeks, then increase to 100 mg daily until follow-up to better manage anxiety and depression.  Continue all other medications as prescribed.  Symptom Monitoring:  Monitor for efficacy and tolerability of resumed antidepressants.  If depression/anxiety not adequately controlled, consider titrating BuSpar.  Psychoeducation:  Discussed potential benefits/risks of medication adjustments and impact on ADHD symptoms, mood, and weight.  Reviewed the importance of medication adherence and strategies to prevent lapses.  Follow-up:  Return in 4 weeks or sooner if symptoms worsen or side effects occur.  Supportive Measures:  Encouraged engagement in grief processing, healthy routines, and task management strategies.  Crisis resources reviewed; patient instructed to seek immediate help for acute safety concerns. 1. Major depressive disorder, recurrent episode, moderate (HCC)  Continue- sertraline (ZOLOFT) 100 MG tablet; Continue- busPIRone (BUSPAR) 15 MG tablet; Take 1 tablet (15 mg total) by mouth 2 (two) times daily.  Dispense: 60 tablet; Refill: 3 decreased- buPROPion (WELLBUTRIN XL) 150 MG 24 hr tablet; Take 2 tablets (300 mg total) by mouth every morning.  Dispense: 30 tablet; Refill: 3  2. GAD (generalized anxiety disorder)  Continue- sertraline (ZOLOFT) 100 MG tablet; Continue- busPIRone (BUSPAR) 15 MG tablet; Take 1 tablet (15 mg total) by mouth 2 (two) times daily.  Dispense: 60 tablet; Refill: 3 Continue- hydrOXYzine (ATARAX) 25 MG tablet; Take 1 tablet (25 mg total) by mouth at bedtime as needed for anxiety (sleep).  Dispense: 30 tablet; Refill: 3  3. Attention deficit hyperactivity disorder (ADHD), combined  type  Decreased- buPROPion (WELLBUTRIN XL) 150 MG 24 hr tablet; Take 2 tablets (300 mg total) by mouth every morning.  Dispense: 30 tablet; Refill: 3   Follow-up in 2.5 months Follow-up with therapy  Majel GORMAN Ramp, FNP 02/21/2024, 9:41 AM

## 2024-02-24 ENCOUNTER — Telehealth (HOSPITAL_COMMUNITY): Admitting: Psychiatry

## 2024-05-22 ENCOUNTER — Encounter (HOSPITAL_COMMUNITY): Payer: Self-pay | Admitting: Psychiatry

## 2024-05-22 ENCOUNTER — Telehealth (HOSPITAL_COMMUNITY): Admitting: Psychiatry

## 2024-05-22 DIAGNOSIS — F3281 Premenstrual dysphoric disorder: Secondary | ICD-10-CM | POA: Diagnosis not present

## 2024-05-22 DIAGNOSIS — R41840 Attention and concentration deficit: Secondary | ICD-10-CM | POA: Diagnosis not present

## 2024-05-22 DIAGNOSIS — F411 Generalized anxiety disorder: Secondary | ICD-10-CM

## 2024-05-22 DIAGNOSIS — F331 Major depressive disorder, recurrent, moderate: Secondary | ICD-10-CM

## 2024-05-22 MED ORDER — ARIPIPRAZOLE 5 MG PO TABS: 5.0000 mg | ORAL_TABLET | Freq: Every day | ORAL | 3 refills | Status: DC

## 2024-05-22 MED ORDER — BUSPIRONE HCL 15 MG PO TABS: 15.0000 mg | ORAL_TABLET | Freq: Two times a day (BID) | ORAL | 3 refills | Status: DC

## 2024-05-22 MED ORDER — BUPROPION HCL ER (XL) 150 MG PO TB24: 150.0000 mg | ORAL_TABLET | ORAL | 3 refills | Status: DC

## 2024-05-22 MED ORDER — HYDROXYZINE HCL 25 MG PO TABS: 25.0000 mg | ORAL_TABLET | Freq: Every evening | ORAL | 3 refills | Status: DC | PRN

## 2024-05-22 MED ORDER — SERTRALINE HCL 50 MG PO TABS: 100.0000 mg | ORAL_TABLET | Freq: Every day | ORAL | 3 refills | Status: DC

## 2024-05-22 MED ORDER — ATOMOXETINE HCL 40 MG PO CAPS: 40.0000 mg | ORAL_CAPSULE | Freq: Every day | ORAL | 3 refills | Status: DC

## 2024-05-22 NOTE — Progress Notes (Signed)
 [facility] MD/PA/NP OP Progress Note Virtual Visit via Video Note  I connected with Julia Santiago on 05/22/24 at 10:00 AM EST by a video enabled telemedicine application and verified that I am speaking with the correct person using two identifiers.  Location: Patient: Home Provider: Clinic   I discussed the limitations of evaluation and management by telemedicine and the availability of in person appointments. The patient expressed understanding and agreed to proceed.  I provided 30 minutes of non-face-to-face time during this encounter.   05/22/2024 12:03 PM Julia Santiago  MRN:  [id number]  Chief Complaint: I have been physically, and mentally exacheted  HPI: 33 year old female seen today for follow-up psychiatric evaluation.  She has a psychiatric history of anxiety and depression.  She is currently managed on BuSpar  15 milligrams twice daily, Wellbutrin  XL 150 mg daily, hydroxyzine  25 mg nightly, and Zoloft  200 mg daily.  She notes her medications are somewhat effective managing her psychiatric condition.  Today she is well-groomed, pleasant, cooperative, engaged in conversation, maintained eye contact.  She informed clinical research associate that she is physically and emotionally exhausted. She notes that she has been having negative thinking. She reports that she does not feel that she is enough. She feels that she is a failure reporting that she can't give her children what they need. She notes that she feels that life has been chaotic. She currently does not have a job and reports that she is living off 1,000 dollars a month. She reports that her [employer] benefits were stopped during the government shut down and she chose to feed herself and her children instead of paying bills. She now notes that she is trying to catch up.  Mentally she reports feeling overwhelmed. She reports that she has severe executive dysfunction noting that she is over stimulated. She notes that she is unable to start and complete  task. She reports being forgetful, disorganized, and inattentive to mentally taxing task. She informed clinical research associate that [facility] is requesting another referral for ADHD testing. Provider agreeable to send new referral.  Today she describes her self as unhopeful and notes that she has been isolating. She reports that instead of doing positive affirmations she is outwardly complaining. She notes that she has not been attending to ADLs and at times finds it difficult to bathe, clean, and eat.  Today provider conducted a GAD-7 and patient scored a 17.  Provider also conducted PHQ-9 and patient scored a 20.  She notes that her sleep is poor.  Today she endorses passive SI but denies wanting to harm herself.   Patient reports she is worried about her 66-year-old son who has autism.  She reports that her other children are doing well.   Patient reports that her symptoms drastically worsened with her menstrual cycle.  She notes that she is irritable, and has a tendency to mood swings, irritability, increased anxiety, and notes that she feels out of control.  She notes that over this time she breaks out with her boyfriend.   Patient finds Wellbutrin  somewhat effective in managing her concentration but notes that higher doses caused her emotions to feel dull.  To help manage mood patient agreeable to starting Abilify 5 mg.  Strattera 40 mg started to help manage concentration.  Provider will send another referral to [facility].  She will continue other medication as prescribed. Potential side effects of medication and risks vs benefits of treatment vs non-treatment were explained and discussed. All questions were answered. No other concerns noted  at this time.  Visit Diagnosis:    ICD-10-CM   1. PMDD (premenstrual dysphoric disorder)  F32.81 ARIPiprazole (ABILIFY) 5 MG tablet    sertraline  (ZOLOFT ) 50 MG tablet    buPROPion  (WELLBUTRIN  XL) 150 MG 24 hr tablet    2. Major depressive disorder, recurrent episode,  moderate (HCC)  F33.1 ARIPiprazole (ABILIFY) 5 MG tablet    sertraline  (ZOLOFT ) 50 MG tablet    busPIRone  (BUSPAR ) 15 MG tablet    buPROPion  (WELLBUTRIN  XL) 150 MG 24 hr tablet    3. GAD (generalized anxiety disorder)  F41.1 sertraline  (ZOLOFT ) 50 MG tablet    busPIRone  (BUSPAR ) 15 MG tablet    hydrOXYzine  (ATARAX ) 25 MG tablet    4. Poor concentration  R41.840 atomoxetine (STRATTERA) 40 MG capsule    buPROPion  (WELLBUTRIN  XL) 150 MG 24 hr tablet           Past Psychiatric History: Anxiety and depression  Past Medical History:  Past Medical History:  Diagnosis Date   Anxiety    Depression    was on certaline for pp depression   Gestational diabetes    Headache     Past Surgical History:  Procedure Laterality Date   CESAREAN SECTION N/A 01/29/2015   Procedure: CESAREAN SECTION;  Surgeon: Carlin DELENA Centers, MD;  Location: [facility];  Service: Obstetrics;  Laterality: N/A;   CESAREAN SECTION N/A 12/21/2016   Procedure: REPEAT CESAREAN SECTION;  Surgeon: Alger Gong, MD;  Location: [facility];  Service: Obstetrics;  Laterality: N/A;   CESAREAN SECTION WITH BILATERAL TUBAL LIGATION Bilateral 11/02/2020   Procedure: CESAREAN SECTION WITH BILATERAL TUBAL LIGATION;  Surgeon: Eldonna Suzen Octave, MD;  Location: [facility];  Service: Obstetrics;  Laterality: Bilateral;    Family Psychiatric History: Mother anxiety and depression, maternal aunt bipolar disorder, maternal grandmother unknown psychiatric conditions  Family History:  Family History  Problem Relation Age of Onset   Anxiety disorder Mother    Depression Mother    Heart disease Father    Stroke Maternal Grandmother    Arthritis Maternal Grandmother    Heart disease Maternal Grandfather    Alcohol abuse Maternal Grandfather    Cancer Paternal Grandmother     Social History:  Social History   Socioeconomic History   Marital status: Single    Spouse name: Not on file   Number of children: 2   Years of  education: Not on file   Highest education level: Not on file  Occupational History   Occupation: UNEMPLOYED  Tobacco Use   Smoking status: Some Days    Types: Cigars   Smokeless tobacco: Never  Vaping Use   Vaping status: Never Used  Substance and Sexual Activity   Alcohol use: Yes    Comment: weekends   Drug use: No   Sexual activity: Not Currently    Birth control/protection: None  Other Topics Concern   Not on file  Social History Narrative   Not on file   Social Drivers of Health   Financial Resource Strain: High Risk (07/17/2021)   Overall Financial Resource Strain (CARDIA)    Difficulty of Paying Living Expenses: Hard  Food Insecurity: Food Insecurity Present (07/17/2021)   Hunger Vital Sign    Worried About Running Out of Food in the Last Year: Sometimes true    Ran Out of Food in the Last Year: Sometimes true  Transportation Needs: No Transportation Needs (07/17/2021)   PRAPARE - Administrator, Civil Service (Medical): No  Lack of Transportation (Non-Medical): No  Physical Activity: Inactive (07/17/2021)   Exercise Vital Sign    Days of Exercise per Week: 0 days    Minutes of Exercise per Session: 0 min  Stress: Stress Concern Present (07/17/2021)   [employer] of Occupational Health - Occupational Stress Questionnaire    Feeling of Stress : Rather much  Social Connections: Socially Isolated (07/17/2021)   Social Connection and Isolation Panel    Frequency of Communication with Friends and Family: More than three times a week    Frequency of Social Gatherings with Friends and Family: Never    Attends Religious Services: Never    Database Administrator or Organizations: No    Attends Banker Meetings: Never    Marital Status: Never married    Allergies:  Allergies  Allergen Reactions   Penicillins     Has patient had a PCN reaction causing immediate rash, facial/tongue/throat swelling, SOB or lightheadedness with hypotension: No Has  patient had a PCN reaction causing severe rash involving mucus membranes or skin necrosis: No Has patient had a PCN reaction that required hospitalization: No Has patient had a PCN reaction occurring within the last 10 years: No If all of the above answers are NO, then may proceed with Cephalosporin use.  As a child-reaction unknown    Metabolic Disorder Labs: Lab Results  Component Value Date   HGBA1C 5.6 04/26/2020   No results found for: PROLACTIN No results found for: CHOL, TRIG, HDL, CHOLHDL, VLDL, LDLCALC No results found for: TSH  Therapeutic Level Labs: No results found for: LITHIUM No results found for: VALPROATE No results found for: CBMZ  Current Medications: Current Outpatient Medications  Medication Sig Dispense Refill   ARIPiprazole (ABILIFY) 5 MG tablet Take 1 tablet (5 mg total) by mouth daily. 30 tablet 3   atomoxetine (STRATTERA) 40 MG capsule Take 1 capsule (40 mg total) by mouth daily. 30 capsule 3   buPROPion  (WELLBUTRIN  XL) 150 MG 24 hr tablet Take 1 tablet (150 mg total) by mouth every morning. 30 tablet 3   busPIRone  (BUSPAR ) 15 MG tablet Take 1 tablet (15 mg total) by mouth 2 (two) times daily. 60 tablet 3   hydrOXYzine  (ATARAX ) 25 MG tablet Take 1 tablet (25 mg total) by mouth at bedtime as needed for anxiety (sleep). 30 tablet 3   predniSONE (DELTASONE) 5 MG tablet Take 5 mg by mouth 4 (four) times daily - after meals and at bedtime.     Prenatal Vit-Fe Fumarate-FA (PRENATAL VITAMINS) 28-0.8 MG TABS Take 1 tablet by mouth daily. 60 tablet 3   sertraline  (ZOLOFT ) 50 MG tablet Take 2 tablets (100 mg total) by mouth daily. Take 1 tablet (50mg ) daily x 2 weeks, then increase 2 tablets (100mg )  daily on 08/30. 30 tablet 3   No current facility-administered medications for this visit.     Musculoskeletal: Strength & Muscle Tone: within normal limits and telehealth visit Gait & Station: normal, telehealth visit Patient leans:  N/A  Psychiatric Specialty Exam: Review of Systems  unknown if currently breastfeeding.There is no height or weight on file to calculate BMI.  General Appearance: Well Groomed  Eye Contact:  Good  Speech:  Clear and Coherent and Normal Rate  Volume:  Normal  Mood:  Anxious and Depressed,  Affect:  Appropriate and Congruent  Thought Process:  Coherent, Goal Directed, and Linear  Orientation:  Full (Time, Place, and Person)  Thought Content: WDL and Logical   Suicidal  Thoughts:  No  Homicidal Thoughts:  No  Memory:  Immediate;   Good Recent;   Good Remote;   Good  Judgement:  Good  Insight:  Good  Psychomotor Activity:  Normal  Concentration:  Concentration: Good and Attention Span: Good  Recall:  Good  Fund of Knowledge: Good  Language: Good  Akathisia:  No  Handed:  Right  AIMS (if indicated): not done  Assets:  Communication Skills Desire for Improvement Financial Resources/Insurance Housing Physical Health Social Support  ADL's:  Intact  Cognition: WNL  Sleep:  Fair   Screenings: AUDIT    Flowsheet Row Video Visit from 06/06/2022 in [facility] Counselor from 07/17/2021 in [facility]  Alcohol Use Disorder Identification Test Final Score (AUDIT) 13 9   GAD-7    Flowsheet Row Video Visit from 05/22/2024 in [facility] Video Visit from 11/29/2023 in [facility] Video Visit from 09/13/2023 in [facility] Video Visit from 07/05/2023 in [facility] Video Visit from 04/03/2023 in [facility]  Total GAD-7 Score 17 9 15 6 9    [id number]    Flowsheet Row Video Visit from 05/22/2024 in [facility] Video Visit from 11/29/2023 in [facility] Video Visit from 09/13/2023 in [facility] Video Visit from  07/05/2023 in [facility] Video Visit from 04/03/2023 in [facility]  PHQ-2 Total Score 6 4 3 1 2   PHQ-9 Total Score 20 15 15 8 12    Flowsheet Row Video Visit from 05/22/2024 in [facility] ED from 09/23/2022 in [facility] Emergency Department at [facility] ED from 09/15/2021 in [facility] Emergency Department at [facility]  C-SSRS RISK CATEGORY Low Risk No Risk No Risk     Assessment and Plan: Patient reports that her anxiety and depression has worsened since her last visit. Patient finds Wellbutrin  somewhat effective in managing her concentration but notes that higher doses caused her emotions to feel dull.  To help manage mood patient agreeable to starting Abilify 5 mg.  Strattera 40 mg started to help manage concentration.  Provider will send another referral to [facility].  She will continue other medication as prescribed.   1. Major depressive disorder, recurrent episode, moderate (HCC)  Continue- ARIPiprazole (ABILIFY) 5 MG tablet; Take 1 tablet (5 mg total) by mouth daily.  Dispense: 30 tablet; Refill: 3 Continue- sertraline  (ZOLOFT ) 50 MG tablet; Take 2 tablets (100 mg total) by mouth daily. Take 1 tablet (50mg ) daily x 2 weeks, then increase 2 tablets (100mg )  daily on 08/30.  Dispense: 30 tablet; Refill: 3 Continue- busPIRone  (BUSPAR ) 15 MG tablet; Take 1 tablet (15 mg total) by mouth 2 (two) times daily.  Dispense: 60 tablet; Refill: 3 Continue- buPROPion  (WELLBUTRIN  XL) 150 MG 24 hr tablet; Take 1 tablet (150 mg total) by mouth every morning.  Dispense: 30 tablet; Refill: 3  2. GAD (generalized anxiety disorder)  Continue- sertraline  (ZOLOFT ) 50 MG tablet; Take 2 tablets (100 mg total) by mouth daily. Take 1 tablet (50mg ) daily x 2 weeks, then increase 2 tablets (100mg )  daily on 08/30.  Dispense: 30 tablet; Refill: 3 Continue- busPIRone  (BUSPAR ) 15 MG tablet; Take 1  tablet (15 mg total) by mouth 2 (two) times daily.  Dispense: 60 tablet; Refill: 3 Continue- hydrOXYzine  (ATARAX ) 25 MG tablet;  Take 1 tablet (25 mg total) by mouth at bedtime as needed for anxiety (sleep).  Dispense: 30 tablet; Refill: 3  3. PMDD (premenstrual dysphoric disorder) (Primary)  Start- ARIPiprazole (ABILIFY) 5 MG tablet; Take 1 tablet (5 mg total) by mouth daily.  Dispense: 30 tablet; Refill: 3 Continue- sertraline  (ZOLOFT ) 50 MG tablet; Take 2 tablets (100 mg total) by mouth daily. Take 1 tablet (50mg ) daily x 2 weeks, then increase 2 tablets (100mg )  daily on 08/30.  Dispense: 30 tablet; Refill: 3 Continue- buPROPion  (WELLBUTRIN  XL) 150 MG 24 hr tablet; Take 1 tablet (150 mg total) by mouth every morning.  Dispense: 30 tablet; Refill: 3  4. Poor concentration  Start- atomoxetine (STRATTERA) 40 MG capsule; Take 1 capsule (40 mg total) by mouth daily.  Dispense: 30 capsule; Refill: 3 Continue- buPROPion  (WELLBUTRIN  XL) 150 MG 24 hr tablet; Take 1 tablet (150 mg total) by mouth every morning.  Dispense: 30 tablet; Refill: 3    Follow-up in 2.5 months Follow-up with therapy  Zane FORBES Bach, NP 05/22/2024, 12:03 PM

## 2024-06-22 ENCOUNTER — Other Ambulatory Visit (HOSPITAL_COMMUNITY): Payer: Self-pay | Admitting: Psychiatry

## 2024-06-22 DIAGNOSIS — F331 Major depressive disorder, recurrent, moderate: Secondary | ICD-10-CM

## 2024-06-22 DIAGNOSIS — F411 Generalized anxiety disorder: Secondary | ICD-10-CM

## 2024-06-22 DIAGNOSIS — F3281 Premenstrual dysphoric disorder: Secondary | ICD-10-CM

## 2024-06-22 MED ORDER — SERTRALINE HCL 100 MG PO TABS: 100.0000 mg | ORAL_TABLET | Freq: Every day | ORAL | 3 refills | Status: DC

## 2024-08-05 ENCOUNTER — Telehealth (HOSPITAL_COMMUNITY): Admitting: Psychiatry

## 2024-08-05 ENCOUNTER — Encounter (HOSPITAL_COMMUNITY): Payer: Self-pay | Admitting: Psychiatry

## 2024-08-05 DIAGNOSIS — F411 Generalized anxiety disorder: Secondary | ICD-10-CM | POA: Diagnosis not present

## 2024-08-05 DIAGNOSIS — F17200 Nicotine dependence, unspecified, uncomplicated: Secondary | ICD-10-CM

## 2024-08-05 DIAGNOSIS — F17290 Nicotine dependence, other tobacco product, uncomplicated: Secondary | ICD-10-CM | POA: Diagnosis not present

## 2024-08-05 DIAGNOSIS — R41840 Attention and concentration deficit: Secondary | ICD-10-CM | POA: Diagnosis not present

## 2024-08-05 DIAGNOSIS — F331 Major depressive disorder, recurrent, moderate: Secondary | ICD-10-CM

## 2024-08-05 DIAGNOSIS — F3281 Premenstrual dysphoric disorder: Secondary | ICD-10-CM

## 2024-08-05 MED ORDER — BUPROPION HCL ER (XL) 150 MG PO TB24: 150.0000 mg | ORAL_TABLET | ORAL | 3 refills | Status: AC

## 2024-08-05 MED ORDER — NICOTINE POLACRILEX 4 MG MT GUM: 4.0000 mg | CHEWING_GUM | OROMUCOSAL | 0 refills | Status: AC | PRN

## 2024-08-05 MED ORDER — ATOMOXETINE HCL 40 MG PO CAPS: 40.0000 mg | ORAL_CAPSULE | Freq: Every day | ORAL | 3 refills | Status: AC

## 2024-08-05 MED ORDER — SERTRALINE HCL 100 MG PO TABS: 100.0000 mg | ORAL_TABLET | Freq: Every day | ORAL | 3 refills | Status: AC

## 2024-08-05 MED ORDER — ARIPIPRAZOLE 5 MG PO TABS: 5.0000 mg | ORAL_TABLET | Freq: Every day | ORAL | 3 refills | Status: AC

## 2024-08-05 MED ORDER — BUSPIRONE HCL 15 MG PO TABS: 15.0000 mg | ORAL_TABLET | Freq: Two times a day (BID) | ORAL | 3 refills | Status: AC

## 2024-08-05 MED ORDER — HYDROXYZINE HCL 25 MG PO TABS: 25.0000 mg | ORAL_TABLET | Freq: Every evening | ORAL | 3 refills | Status: AC | PRN

## 2024-08-05 NOTE — Progress Notes (Signed)
 [facility] MD/PA/NP OP Progress Note Virtual Visit via Video Note  I connected with Julia Santiago on 08/05/24 at 11:30 AM EST by a video enabled telemedicine application and verified that I am speaking with the correct person using two identifiers.  Location: Patient: Home Provider: Clinic   I discussed the limitations of evaluation and management by telemedicine and the availability of in person appointments. The patient expressed understanding and agreed to proceed.  I provided 30 minutes of non-face-to-face time during this encounter.   08/05/2024 12:25 PM Julia Santiago  MRN:  [id number]  Chief Complaint: I am more regulated and feel like myself  HPI: 34 year old female seen today for follow-up psychiatric evaluation.  She has a psychiatric history of anxiety and depression.  She is currently managed on BuSpar  15 milligrams twice daily, Wellbutrin  XL 150 mg daily, hydroxyzine  25 mg nightly, Abilify  5, and Zoloft  200 mg daily.  She notes her medications are somewhat effective managing her psychiatric condition.  Today she is well-groomed, pleasant, cooperative, engaged in conversation, maintained eye contact.  She informed clinical research associate that she feels more regulated and notes that she feels like herself.  Patient notes that she is more energetic.  She also informed clinical research associate that her activities of daily living has improved.  She informed clinical research associate that she is keeping up with hygiene and cleaning her home.  Patient notes that her daughter has told her that she looks more like herself.  Since starting Strattera  and Abilify  she notes that her concentration has drastically improved and notes that her depression has drastically subsided.   Today provider conducted a GAD-7 and patient scored a 2, at her last visit she scored 17.  Provider also conducted PHQ-9 and patient scored a 3, at her last visit se scored a 20.  She notes that her sleep is adequate.  Today she denies SI/HI/VAH, mania, or paranoia.    Patient continues to wait for an appointment with [facility].     No medication changes made today. Patient agreeable to continue medications as prescribed. No other concerns noted at this time.  Visit Diagnosis:    ICD-10-CM   1. Tobacco dependence  F17.200 nicotine  polacrilex (NICORETTE ) 4 MG gum    2. Major depressive disorder, recurrent episode, moderate (HCC)  F33.1 sertraline  (ZOLOFT ) 100 MG tablet    busPIRone  (BUSPAR ) 15 MG tablet    buPROPion  (WELLBUTRIN  XL) 150 MG 24 hr tablet    ARIPiprazole  (ABILIFY ) 5 MG tablet    3. GAD (generalized anxiety disorder)  F41.1 sertraline  (ZOLOFT ) 100 MG tablet    busPIRone  (BUSPAR ) 15 MG tablet    hydrOXYzine  (ATARAX ) 25 MG tablet    4. PMDD (premenstrual dysphoric disorder)  F32.81 sertraline  (ZOLOFT ) 100 MG tablet    buPROPion  (WELLBUTRIN  XL) 150 MG 24 hr tablet    ARIPiprazole  (ABILIFY ) 5 MG tablet    5. Poor concentration  R41.840 buPROPion  (WELLBUTRIN  XL) 150 MG 24 hr tablet    atomoxetine  (STRATTERA ) 40 MG capsule            Past Psychiatric History: Anxiety and depression  Past Medical History:  Past Medical History:  Diagnosis Date   Anxiety    Depression    was on certaline for pp depression   Gestational diabetes    Headache     Past Surgical History:  Procedure Laterality Date   CESAREAN SECTION N/A 01/29/2015   Procedure: CESAREAN SECTION;  Surgeon: Carlin DELENA Centers, MD;  Location: [facility];  Service: Obstetrics;  Laterality: N/A;   CESAREAN SECTION N/A 12/21/2016   Procedure: REPEAT CESAREAN SECTION;  Surgeon: Alger Gong, MD;  Location: [facility];  Service: Obstetrics;  Laterality: N/A;   CESAREAN SECTION WITH BILATERAL TUBAL LIGATION Bilateral 11/02/2020   Procedure: CESAREAN SECTION WITH BILATERAL TUBAL LIGATION;  Surgeon: Eldonna Suzen Octave, MD;  Location: [facility];  Service: Obstetrics;  Laterality: Bilateral;    Family Psychiatric History: Mother anxiety and depression, maternal aunt  bipolar disorder, maternal grandmother unknown psychiatric conditions  Family History:  Family History  Problem Relation Age of Onset   Anxiety disorder Mother    Depression Mother    Heart disease Father    Stroke Maternal Grandmother    Arthritis Maternal Grandmother    Heart disease Maternal Grandfather    Alcohol abuse Maternal Grandfather    Cancer Paternal Grandmother     Social History:  Social History   Socioeconomic History   Marital status: Single    Spouse name: Not on file   Number of children: 2   Years of education: Not on file   Highest education level: Not on file  Occupational History   Occupation: UNEMPLOYED  Tobacco Use   Smoking status: Some Days    Types: Cigars   Smokeless tobacco: Never  Vaping Use   Vaping status: Never Used  Substance and Sexual Activity   Alcohol use: Yes    Comment: weekends   Drug use: No   Sexual activity: Not Currently    Birth control/protection: None  Other Topics Concern   Not on file  Social History Narrative   Not on file   Social Drivers of Health   Tobacco Use: High Risk (05/22/2024)   Patient History    Smoking Tobacco Use: Some Days    Smokeless Tobacco Use: Never    Passive Exposure: Not on file  Financial Resource Strain: High Risk (07/17/2021)   Overall Financial Resource Strain (CARDIA)    Difficulty of Paying Living Expenses: Hard  Food Insecurity: Food Insecurity Present (07/17/2021)   Hunger Vital Sign    Worried About Running Out of Food in the Last Year: Sometimes true    Ran Out of Food in the Last Year: Sometimes true  Transportation Needs: No Transportation Needs (07/17/2021)   PRAPARE - Administrator, Civil Service (Medical): No    Lack of Transportation (Non-Medical): No  Physical Activity: Inactive (07/17/2021)   Exercise Vital Sign    Days of Exercise per Week: 0 days    Minutes of Exercise per Session: 0 min  Stress: Stress Concern Present (07/17/2021)   [employer] of  Occupational Health - Occupational Stress Questionnaire    Feeling of Stress : Rather much  Social Connections: Socially Isolated (07/17/2021)   Social Connection and Isolation Panel    Frequency of Communication with Friends and Family: More than three times a week    Frequency of Social Gatherings with Friends and Family: Never    Attends Religious Services: Never    Database Administrator or Organizations: No    Attends Banker Meetings: Never    Marital Status: Never married  Depression (PHQ2-9): Low Risk (08/05/2024)   Depression (PHQ2-9)    PHQ-2 Score: 3  Recent Concern: Depression (PHQ2-9) - High Risk (05/22/2024)   Depression (PHQ2-9)    PHQ-2 Score: 20  Alcohol Screen: Medium Risk (06/06/2022)   Alcohol Screen    Last Alcohol Screening Score (AUDIT): 13  Housing: Low Risk (07/17/2021)  Housing    Last Housing Risk Score: 0  Utilities: Not on file  Health Literacy: Not on file    Allergies:  Allergies  Allergen Reactions   Penicillins     Has patient had a PCN reaction causing immediate rash, facial/tongue/throat swelling, SOB or lightheadedness with hypotension: No Has patient had a PCN reaction causing severe rash involving mucus membranes or skin necrosis: No Has patient had a PCN reaction that required hospitalization: No Has patient had a PCN reaction occurring within the last 10 years: No If all of the above answers are NO, then may proceed with Cephalosporin use.  As a child-reaction unknown    Metabolic Disorder Labs: Lab Results  Component Value Date   HGBA1C 5.6 04/26/2020   No results found for: PROLACTIN No results found for: CHOL, TRIG, HDL, CHOLHDL, VLDL, LDLCALC No results found for: TSH  Therapeutic Level Labs: No results found for: LITHIUM No results found for: VALPROATE No results found for: CBMZ  Current Medications: Current Outpatient Medications  Medication Sig Dispense Refill   nicotine  polacrilex  (NICORETTE ) 4 MG gum Take 1 each (4 mg total) by mouth as needed for smoking cessation. 100 tablet 0   ARIPiprazole  (ABILIFY ) 5 MG tablet Take 1 tablet (5 mg total) by mouth daily. 30 tablet 3   atomoxetine  (STRATTERA ) 40 MG capsule Take 1 capsule (40 mg total) by mouth daily. 30 capsule 3   buPROPion  (WELLBUTRIN  XL) 150 MG 24 hr tablet Take 1 tablet (150 mg total) by mouth every morning. 30 tablet 3   busPIRone  (BUSPAR ) 15 MG tablet Take 1 tablet (15 mg total) by mouth 2 (two) times daily. 60 tablet 3   hydrOXYzine  (ATARAX ) 25 MG tablet Take 1 tablet (25 mg total) by mouth at bedtime as needed for anxiety (sleep). 30 tablet 3   predniSONE (DELTASONE) 5 MG tablet Take 5 mg by mouth 4 (four) times daily - after meals and at bedtime.     Prenatal Vit-Fe Fumarate-FA (PRENATAL VITAMINS) 28-0.8 MG TABS Take 1 tablet by mouth daily. 60 tablet 3   sertraline  (ZOLOFT ) 100 MG tablet Take 1 tablet (100 mg total) by mouth daily. 30 tablet 3   No current facility-administered medications for this visit.     Musculoskeletal: Strength & Muscle Tone: within normal limits and telehealth visit Gait & Station: normal, telehealth visit Patient leans: N/A  Psychiatric Specialty Exam: Review of Systems  unknown if currently breastfeeding.There is no height or weight on file to calculate BMI.  General Appearance: Well Groomed  Eye Contact:  Good  Speech:  Clear and Coherent and Normal Rate  Volume:  Normal  Mood:  Euthymic,  Affect:  Appropriate and Congruent  Thought Process:  Coherent, Goal Directed, and Linear  Orientation:  Full (Time, Place, and Person)  Thought Content: WDL and Logical   Suicidal Thoughts:  No  Homicidal Thoughts:  No  Memory:  Immediate;   Good Recent;   Good Remote;   Good  Judgement:  Good  Insight:  Good  Psychomotor Activity:  Normal  Concentration:  Concentration: Good and Attention Span: Good  Recall:  Good  Fund of Knowledge: Good  Language: Good  Akathisia:  No   Handed:  Right  AIMS (if indicated): not done  Assets:  Communication Skills Desire for Improvement Financial Resources/Insurance Housing Physical Health Social Support  ADL's:  Intact  Cognition: WNL  Sleep:  Good   Screenings: AUDIT    Flowsheet Row Video Visit from 06/06/2022  in [facility] Counselor from 07/17/2021 in [facility]  Alcohol Use Disorder Identification Test Final Score (AUDIT) 13 9   GAD-7    Flowsheet Row Video Visit from 08/05/2024 in [facility] Video Visit from 05/22/2024 in [facility] Video Visit from 11/29/2023 in [facility] Video Visit from 09/13/2023 in [facility] Video Visit from 07/05/2023 in [facility]  Total GAD-7 Score 2 17 9 15 6    PHQ2-9    Flowsheet Row Video Visit from 08/05/2024 in [facility] Video Visit from 05/22/2024 in [facility] Video Visit from 11/29/2023 in [facility] Video Visit from 09/13/2023 in [facility] Video Visit from 07/05/2023 in [facility]  PHQ-2 Total Score 1 6 4 3 1   PHQ-9 Total Score 3 20 15 15 8    Flowsheet Row Video Visit from 05/22/2024 in [facility] ED from 09/23/2022 in [facility] Emergency Department at [facility] ED from 09/15/2021 in [facility] Emergency Department at [facility]  C-SSRS RISK CATEGORY Low Risk No Risk No Risk     Assessment and Plan: Patient reports that her anxiety, depression, and sleep as improved since her last visit. No medication changes made today. Patient agreeable to continue medications as prescribed. Patient continues to await an appointment at [facility] for ADHD testing.   1. Major depressive disorder,  recurrent episode, moderate (HCC)  Continue- ARIPiprazole  (ABILIFY ) 5 MG tablet; Take 1 tablet (5 mg total) by mouth daily.  Dispense: 30 tablet; Refill: 3 Continue- sertraline  (ZOLOFT ) 50 MG tablet; Take 2 tablets (100 mg total) by mouth daily. Take 1 tablet (50mg ) daily x 2 weeks, then increase 2 tablets (100mg )  daily on 08/30.  Dispense: 30 tablet; Refill: 3 Continue- busPIRone  (BUSPAR ) 15 MG tablet; Take 1 tablet (15 mg total) by mouth 2 (two) times daily.  Dispense: 60 tablet; Refill: 3 Continue- buPROPion  (WELLBUTRIN  XL) 150 MG 24 hr tablet; Take 1 tablet (150 mg total) by mouth every morning.  Dispense: 30 tablet; Refill: 3  2. GAD (generalized anxiety disorder)  Continue- sertraline  (ZOLOFT ) 50 MG tablet; Take 2 tablets (100 mg total) by mouth daily. Take 1 tablet (50mg ) daily x 2 weeks, then increase 2 tablets (100mg )  daily on 08/30.  Dispense: 30 tablet; Refill: 3 Continue- busPIRone  (BUSPAR ) 15 MG tablet; Take 1 tablet (15 mg total) by mouth 2 (two) times daily.  Dispense: 60 tablet; Refill: 3 Continue- hydrOXYzine  (ATARAX ) 25 MG tablet; Take 1 tablet (25 mg total) by mouth at bedtime as needed for anxiety (sleep).  Dispense: 30 tablet; Refill: 3  3. PMDD (premenstrual dysphoric disorder) (Primary)  Continue- ARIPiprazole  (ABILIFY ) 5 MG tablet; Take 1 tablet (5 mg total) by mouth daily.  Dispense: 30 tablet; Refill: 3 Continue- sertraline  (ZOLOFT ) 50 MG tablet; Take 2 tablets (100 mg total) by mouth daily. Take 1 tablet (50mg ) daily x 2 weeks, then increase 2 tablets (100mg )  daily on 08/30.  Dispense: 30 tablet; Refill: 3 Continue- buPROPion  (WELLBUTRIN  XL) 150 MG 24 hr tablet; Take 1 tablet (150 mg total) by mouth every morning.  Dispense: 30 tablet; Refill: 3  4. Poor concentration  Continue- atomoxetine  (STRATTERA ) 40 MG capsule; Take 1 capsule (40 mg total) by mouth daily.  Dispense: 30 capsule; Refill: 3 Continue- buPROPion  (WELLBUTRIN  XL) 150 MG  24 hr tablet; Take 1 tablet  (150 mg total) by mouth every morning.  Dispense: 30 tablet; Refill: 3    Follow-up in 2.5 months Follow-up with therapy  Zane FORBES Bach, NP 08/05/2024, 12:25 PM

## 2024-10-07 ENCOUNTER — Telehealth (HOSPITAL_COMMUNITY): Admitting: Psychiatry
